# Patient Record
Sex: Female | Born: 1975 | Race: Black or African American | Hispanic: No | Marital: Single | Smoking: Never smoker
Health system: Southern US, Community
[De-identification: ages and names within clinical notes are randomized; demographics above are authoritative.]

## PROBLEM LIST (undated history)

## (undated) DIAGNOSIS — G35D Multiple sclerosis, unspecified: Secondary | ICD-10-CM

## (undated) DIAGNOSIS — G35 Multiple sclerosis: Secondary | ICD-10-CM

## (undated) DIAGNOSIS — E119 Type 2 diabetes mellitus without complications: Secondary | ICD-10-CM

## (undated) HISTORY — DX: Type 2 diabetes mellitus without complications: E11.9

## (undated) HISTORY — DX: Multiple sclerosis: G35

## (undated) HISTORY — DX: Multiple sclerosis, unspecified: G35.D

## (undated) HISTORY — PX: KNEE SURGERY: SHX244

---

## 2014-10-26 HISTORY — PX: ABDOMINAL HYSTERECTOMY: SHX81

## 2022-02-24 NOTE — Progress Notes (Signed)
? ?[facility] ? ?PATIENT: Karen Oconnor ?DOB: Jul 18, 1976 ? ?REFERRING DOCTOR OR PCP: Harvie Heck, MD; Megan Mans, NP ?SOURCE: Patient, notes from [facility] between 2013 and present, imaging and lab reports, MRI images will be personally reviewed when available an addendum will be added ? ?_________________________________ ? ? ?HISTORICAL ? ?CHIEF COMPLAINT:  ?Chief Complaint  ?Patient presents with  ? New Patient (Initial Visit)  ?  RM 2,alone. Paper referral from [facility]/MS TOC. Dx w/ MS in 2013 by MRI. Tried/failed: Started on Tecfidera in 2013 (flare ups). Then switched to Ocrevus a couple years ago.  Pt reports sx have worsened since covid. Right side weakness has increased, requesting PT. Wanting to discuss meds/pain management. Gabapentin takes 3 hours hours to kick in.  Last MRI 11/2019. Does not have CD. Having gait/balance issues. Wears glasses.   ? ? ?HISTORY OF PRESENT ILLNESS:  ?I had the pleasure seeing your patient, Karen Oconnor, at the [facility] at [facility] for a neurologic consultation regarding her multiple sclerosis. ? ?In July, she presented with electrical sock sensations in her back and legs followed by numbness in the right flank/abdomen and allodynia in her right leg.   She also had a right foot drop and spasticity.   She also has numbness in her left foot.   She was concerned about stroke and saw Dr. Corwin Levins who did an MRI of the cervical/thoracic spine.   She had a large non-enhancing lesion (was 3 months after 1st symptom) at T7-T11.   MRI of the brain also showed lesions and MS was diagnosed.   She was initially placed on Tecfidera and stayed on that medication until March 2021 after an MRI February 2021 showed additional multiple sclerosis foci.  She was placed on Ocrevus March 2021 and continues on that medication.  She has been seeing Dr. Melrose Nakayama at [facility]. ? ?She is currently on Ocrevus and tolerates it very well.     She has no infusion reactions. She has had no exacerbations since starting.   She is walking without support.   She has a right foot drop and stumbles but very rare falls.     She has altered sensation sensation in her right leg and a different altered sensation right flank with numbness.    Bladder function is doing ok with some urgency but no incontinence.   She had some bowel incontinence while on Metformin but none off of it.     Vision was poor as a child and she feels it has been stable and she is much better with contacts.     Color vision is fine.   She never had diplopia.    ? ?She denies limiting fatigue.   She sleeps well most nights.  Mood and cognition are doing well.   ? ?She also has type 2 NIDDM.   She had EBV and Varicella as a child.     ? ? ?Imaging (report): ?MRI of the brain 12/09/2019 showed multiple T2/FLAIR hyperintense foci in the periventricular, juxtacortical and deep white matter.  2 of the foci, 1 high in the right frontal lobe and and other in the periventricular white matter adjacent to the left ventricular atrium. ? ?MRI of the cervical and thoracic spine 12/09/2019 showed a focus at T7-T8 centrally towards the right and possible focus at C3-C4. ? ?MRI report from 08/24/2012 showed multiple T2/FLAIR hyperintense foci in the cerebral hemispheres consistent with MS with enhancement of a focus in the  left parietal lobe. ? ?MRI of the cervical and thoracic spine 08/11/2012 shows a right hemicord focus from T7-T11 that did not enhance.  The cervical spinal cord was normal. ? ?Laboratory tests: ?11/28/2019 Quantiferon TB and hepatitis B and C tests were negative.  IgG and IgM and IgA were normal. ? ?08/30/2012: Anti-NMO IgG was negative ? ?REVIEW OF SYSTEMS: ?Constitutional: No fevers, chills, sweats, or change in appetite ?Eyes: No visual changes, double vision, eye pain ?Ear, nose and throat: No hearing loss, ear pain, nasal congestion, sore throat ?Cardiovascular: No chest pain,  palpitations ?Respiratory:  No shortness of breath at rest or with exertion.   No wheezes ?GastrointestinaI: No nausea, vomiting, diarrhea, abdominal pain, fecal incontinence ?Genitourinary:  No dysuria, urinary retention or frequency.  No nocturia. ?Musculoskeletal:  No neck pain, back pain ?Integumentary: No rash, pruritus, skin lesions ?Neurological: as above ?Psychiatric: No depression at this time.  No anxiety ?Endocrine: No palpitations, diaphoresis, change in appetite, change in weigh or increased thirst ?Hematologic/Lymphatic:  No anemia, purpura, petechiae. ?Allergic/Immunologic: No itchy/runny eyes, nasal congestion, recent allergic reactions, rashes ? ?ALLERGIES: ?No Known Allergies ? ?HOME MEDICATIONS: ? ?Current Outpatient Medications:  ?  Alogliptin Benzoate 25 MG TABS, Take 1 tablet by mouth daily., Disp: , Rfl:  ?  baclofen (LIORESAL) 10 MG tablet, Take 10 mg by mouth as needed., Disp: , Rfl:  ?  Biotin 10 MG CAPS, Take 10,000 mcg by mouth daily., Disp: , Rfl:  ?  Cholecalciferol 25 MCG (1000 UT) capsule, Take 5,000 Units by mouth daily., Disp: , Rfl:  ?  FARXIGA 10 MG TABS tablet, Take 10 mg by mouth daily., Disp: , Rfl:  ?  gabapentin (NEURONTIN) 300 MG capsule, Take 900 mg by mouth 2 (two) times daily., Disp: , Rfl:  ?  naproxen (NAPROSYN) 250 MG tablet, Take 250 mg by mouth as needed., Disp: , Rfl:  ?  ocrelizumab (OCREVUS) 300 MG/10ML injection, Inject 600 mg into the vein every 6 (six) months., Disp: , Rfl:  ? ?PAST MEDICAL HISTORY: ?Past Medical History:  ?Diagnosis Date  ? Diabetes (HCC)   ? MS (multiple sclerosis) (HCC)   ? ? ?PAST SURGICAL HISTORY: ? ? ?FAMILY HISTORY: ?Family History  ?Problem Relation Age of Onset  ? Gout Mother   ? Diabetes Father   ? ? ?SOCIAL HISTORY: ? ?Social History  ? ?Socioeconomic History  ? Marital status: Single  ?  Spouse name: Not on file  ? Number of children: Not on file  ? Years of education: Not on file  ? Highest education level: Not on file   ?Occupational History  ? Not on file  ?Tobacco Use  ? Smoking status: Never  ? Smokeless tobacco: Never  ?Substance and Sexual Activity  ? Alcohol use: Yes  ?  Comment: rare  ? Drug use: Never  ? Sexual activity: Not on file  ?Other Topics Concern  ? Not on file  ?Social History Narrative  ? Right handed  ? Caffeine use: coffee/tea daily  ? ?Social Determinants of Health  ? ?Financial Resource Strain: Not on file  ?Food Insecurity: Not on file  ?Transportation Needs: Not on file  ?Physical Activity: Not on file  ?Stress: Not on file  ?Social Connections: Not on file  ?Intimate Partner Violence: Not on file  ? ? ? ?PHYSICAL EXAM ? ?Vitals:  ? 02/25/22 0900  ?BP: (!) 143/110  ?Pulse: 84  ?Weight: 202 lb 8 oz (91.9 kg)  ?Height: 5\' 6"  (1.676 m)  ? ? ?  Body mass index is 32.68 kg/m?. ? ? ? ? ?General: The patient is well-developed and well-nourished and in no acute distress ? ?HEENT:  Head is [place]/AT.  Sclera are anicteric.  Funduscopic exam shows normal optic discs and retinal vessels. ? ?Neck: No carotid bruits are noted.  The neck is nontender. ? ?Cardiovascular: The heart has a regular rate and rhythm with a normal S1 and S2. There were no murmurs, gallops or rubs.   ? ?Skin: Extremities are without rash or  edema. ? ?Musculoskeletal:  Back is nontender ? ?Neurologic Exam ? ?Mental status: The patient is alert and oriented x 3 at the time of the examination. The patient has apparent normal recent and remote memory, with an apparently normal attention span and concentration ability.   Speech is normal. ? ?Cranial nerves: Extraocular movements are full. Pupils are equal, round, and reactive to light and accomodation.  V visual acuity was reduced OD versus OS.  Color vision was symmetric.  There is good facial sensation to soft touch bilaterally.Facial strength is normal.  Trapezius and sternocleidomastoid strength is normal. No dysarthria is noted.  The tongue is midline, and the patient has symmetric elevation of the  soft palate. No obvious hearing deficits are noted. ? ?Motor:  Muscle bulk is normal.   Tone is normal. Strength is  5 / 5 in arms and left leg but 4+/5 proximal right leg and 4/5 EHL and ankle extension ? ?Sensory: She re

## 2022-02-25 ENCOUNTER — Ambulatory Visit: Payer: Federal, State, Local not specified - PPO | Admitting: Neurology

## 2022-02-25 ENCOUNTER — Encounter: Payer: Self-pay | Admitting: Neurology

## 2022-02-25 VITALS — BP 143/110 | HR 84 | Ht 66.0 in | Wt 202.5 lb

## 2022-02-25 DIAGNOSIS — G373 Acute transverse myelitis in demyelinating disease of central nervous system: Secondary | ICD-10-CM

## 2022-02-25 DIAGNOSIS — G35 Multiple sclerosis: Secondary | ICD-10-CM | POA: Diagnosis not present

## 2022-02-25 DIAGNOSIS — R208 Other disturbances of skin sensation: Secondary | ICD-10-CM | POA: Insufficient documentation

## 2022-02-25 DIAGNOSIS — G35A Relapsing-remitting multiple sclerosis: Secondary | ICD-10-CM | POA: Insufficient documentation

## 2022-02-25 DIAGNOSIS — M21371 Foot drop, right foot: Secondary | ICD-10-CM | POA: Diagnosis not present

## 2022-02-25 DIAGNOSIS — Z79899 Other long term (current) drug therapy: Secondary | ICD-10-CM | POA: Diagnosis not present

## 2022-02-27 LAB — IGG, IGA, IGM
IgA/Immunoglobulin A, Serum: 237 mg/dL (ref 87–352)
IgG (Immunoglobin G), Serum: 1572 mg/dL (ref 586–1602)
IgM (Immunoglobulin M), Srm: 44 mg/dL (ref 26–217)

## 2022-02-27 LAB — NEUROMYELITIS OPTICA AUTOAB, IGG: NMO IgG Autoantibodies: <1.5 U/mL (ref 0.0–3.0)

## 2022-02-27 LAB — ANTI-MOG, SERUM: MOG Antibody, Cell-based IFA: NEGATIVE

## 2022-02-28 ENCOUNTER — Ambulatory Visit (HOSPITAL_BASED_OUTPATIENT_CLINIC_OR_DEPARTMENT_OTHER)
Admission: RE | Admit: 2022-02-28 | Discharge: 2022-02-28 | Disposition: A | Discharge status: Home / Self Care | Payer: Federal, State, Local not specified - PPO | Source: Ambulatory Visit | Attending: Neurology | Admitting: Neurology

## 2022-02-28 ENCOUNTER — Encounter (HOSPITAL_BASED_OUTPATIENT_CLINIC_OR_DEPARTMENT_OTHER): Payer: Self-pay

## 2022-02-28 DIAGNOSIS — M21371 Foot drop, right foot: Secondary | ICD-10-CM

## 2022-02-28 DIAGNOSIS — G373 Acute transverse myelitis in demyelinating disease of central nervous system: Secondary | ICD-10-CM

## 2022-02-28 DIAGNOSIS — G35 Multiple sclerosis: Secondary | ICD-10-CM | POA: Insufficient documentation

## 2022-02-28 IMAGING — MR MR THORACIC SPINE WO/W CM
7 of 13 series · 20 of 48 positions shown · IV contrast (gadavist)
Comparison: None Available.

CLINICAL DATA: Multiple sclerosis, right-sided weakness

EXAM:
MRI CERVICAL AND THORACIC SPINE WITHOUT AND WITH CONTRAST
TECHNIQUE: Multiplanar and multiecho pulse sequences of the cervical spine, to
include the craniocervical junction and cervicothoracic junction,
and the thoracic spine, were obtained without and with intravenous
contrast.
CONTRAST:  7.5mL GADAVIST GADOBUTROL 1 MMOL/ML IV SOLN

[Series 4: T1 · sagittal · 3.0mm · 0.94mm/px · 1 of 9 slices shown (1 of 4)]
[im 1/9]
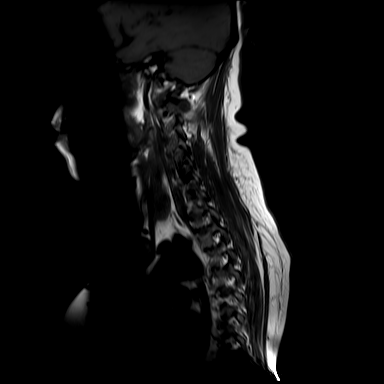

[Series 5: T1 · sagittal · 4.0mm · 0.53mm/px · 2 of 14 slices shown (2 of 4)]
[im 1/14]
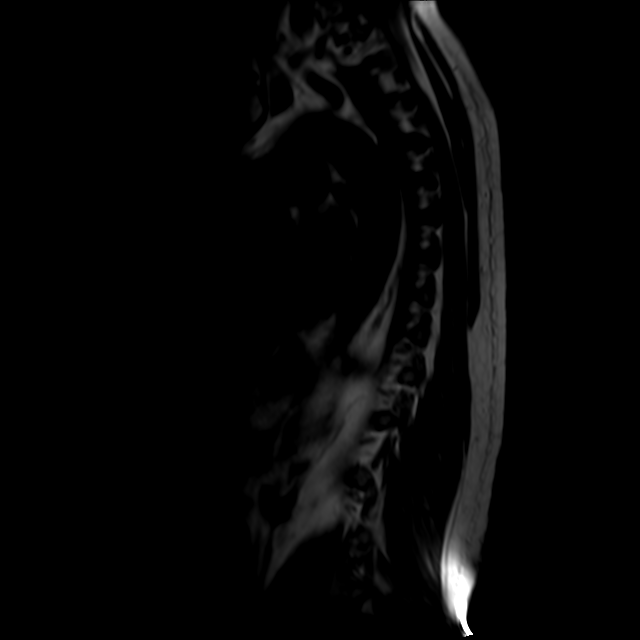
[im 14/14]
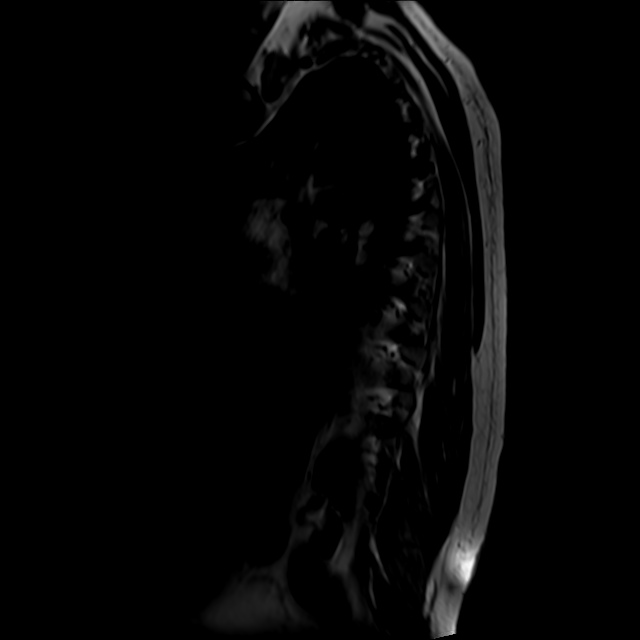

[Series 7: T2 · axial · 4.0mm · 0.39mm/px · z∈[-302,-197]mm · 4 of 24 slices shown (1 of 3)]
[im 1/24]
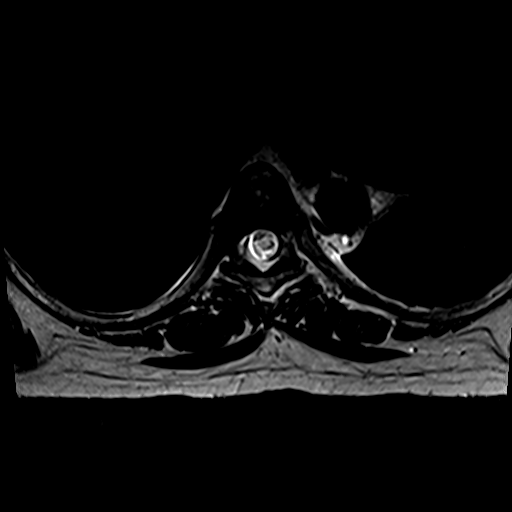
[im 8/24]
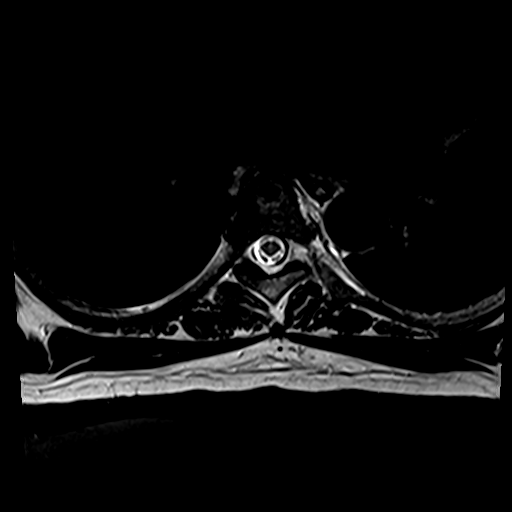
[im 16/24]
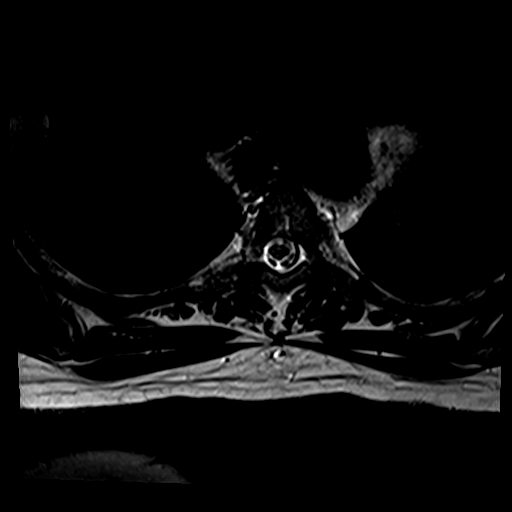
[im 24/24]
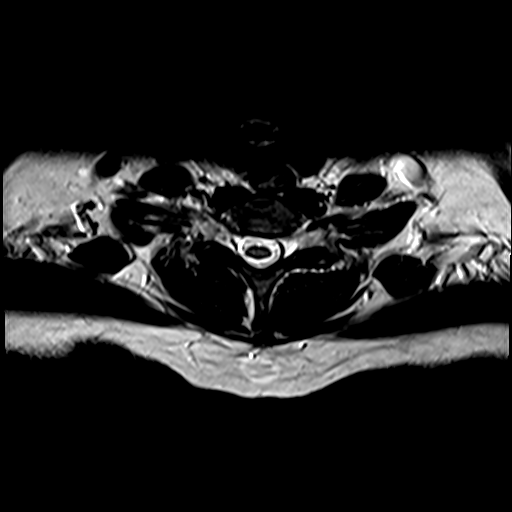

[Series 8: T2 · axial · 4.0mm · 0.39mm/px · z∈[-413,-265]mm · 5 of 24 slices shown (2 of 3)]
[im 1/24]
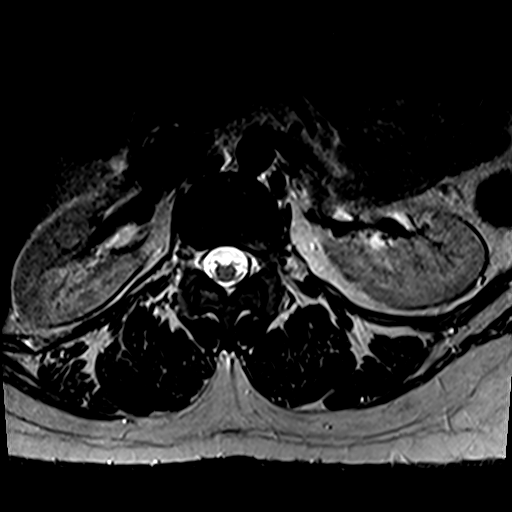
[im 6/24]
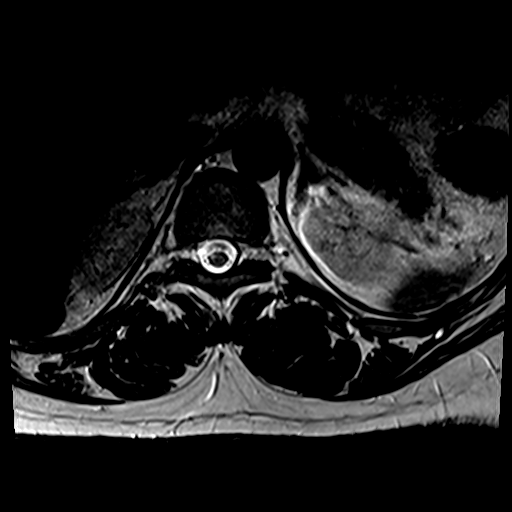
[im 12/24]
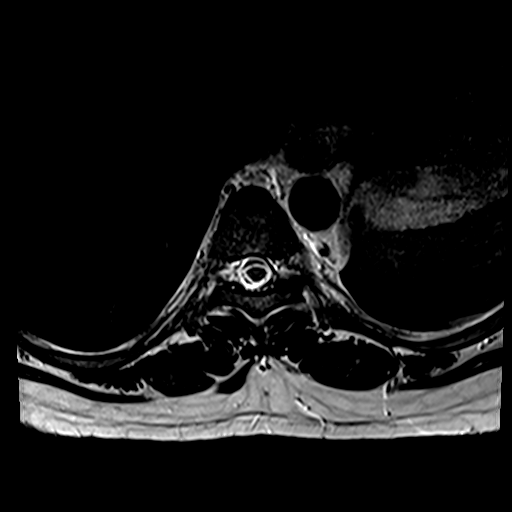
[im 18/24]
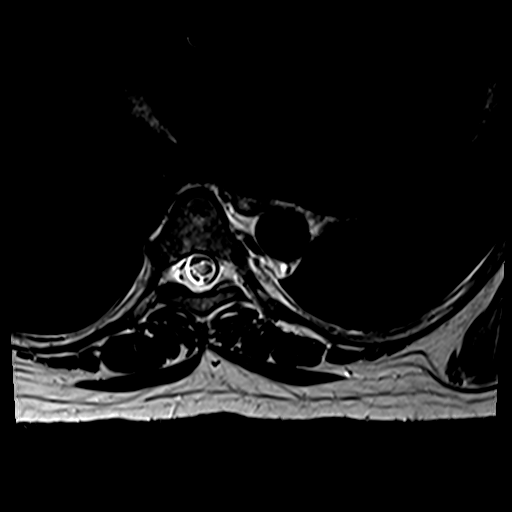
[im 24/24]
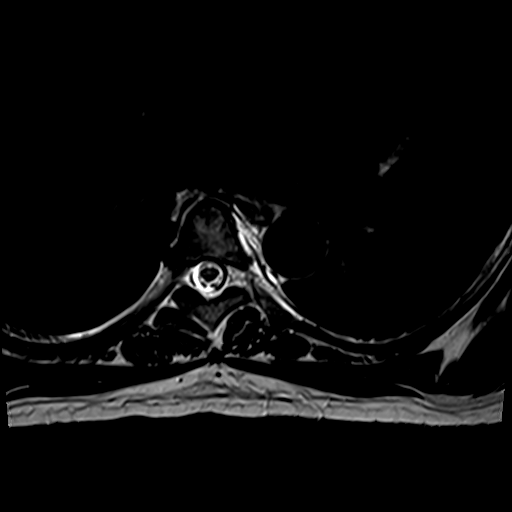

[Series 11: T1 · axial · non-contrast · 4.0mm · 0.41mm/px · z∈[-273,-198]mm · 3 of 18 slices shown (3 of 4)]
[im 1/18]
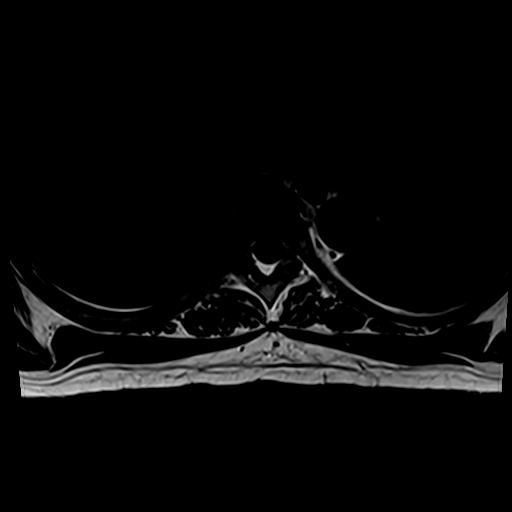
[im 9/18]
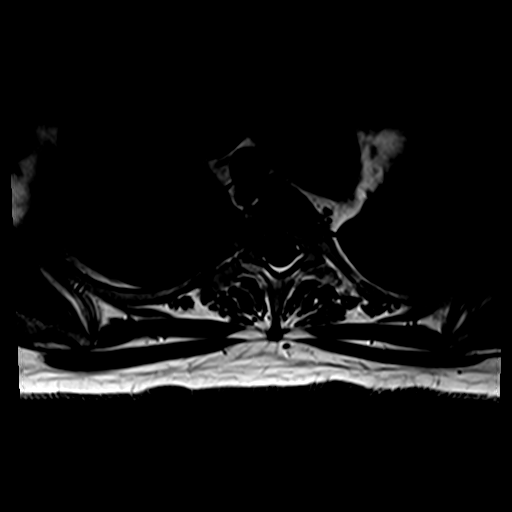
[im 18/18]
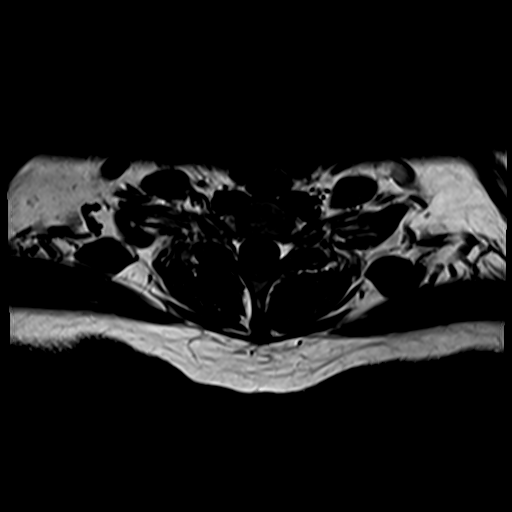

[Series 12: T1 · axial · non-contrast · 4.0mm · 0.41mm/px · z∈[-412,-375]mm · 2 of 24 slices shown (4 of 4)]
[im 1/24]
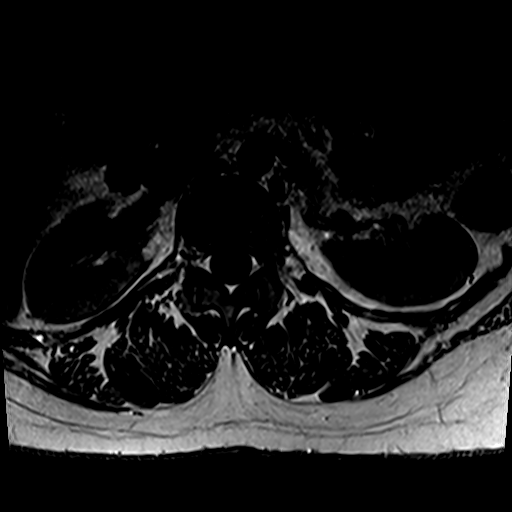
[im 6/24]
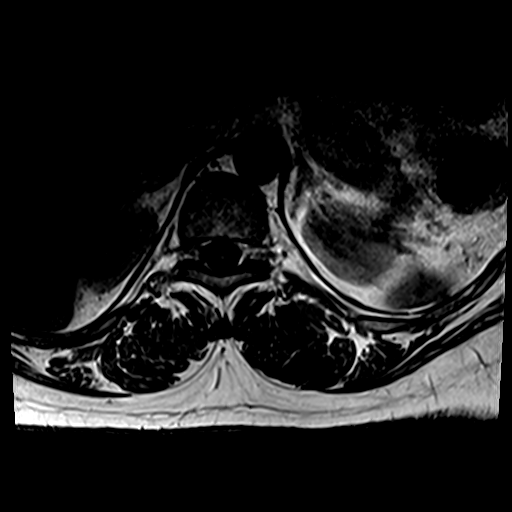

[Series 13: T2 · sagittal · 4.0mm · 0.89mm/px · 3 of 14 slices shown (3 of 3)]
[im 1/14]
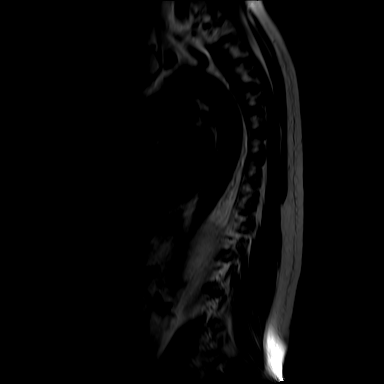
[im 7/14]
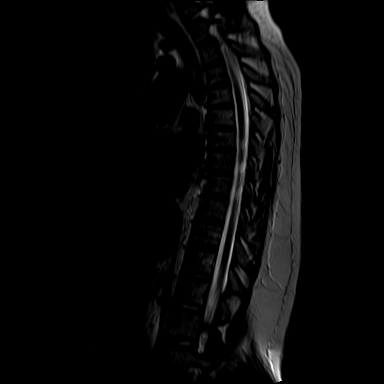
[im 14/14]
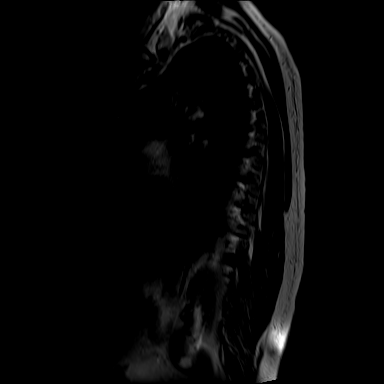

[20 of 48 positions shown; findings below may reference images not displayed]

FINDINGS: MRI CERVICAL SPINE

Alignment: Preserved.

Vertebrae: Vertebral body heights are maintained. There is no marrow
edema. No suspicious osseous lesion.

Cord: No definite abnormal signal.  No abnormal enhancement.

Posterior Fossa, vertebral arteries, paraspinal tissues:
Intracranial findings dictated separately. Otherwise unremarkable.

Disc levels: Trace central disc protrusion at C3-C4. There is no
canal or foraminal stenosis at any level.

MRI THORACIC SPINE

Alignment:  Preserved.

Vertebrae: Vertebral body heights are maintained. No marrow edema.
No suspicious osseous lesion.

Cord: T2 hyperintensity involving much of the cord eccentric to the
right at the T7 and T8 levels with associated volume loss on the
right. No definite additional abnormal signal. No abnormal
enhancement.

Paraspinal and other soft tissues: Unremarkable.

Disc levels: Intervertebral disc heights and signal are maintained.
There is no canal or foraminal stenosis at any level.
IMPRESSION: Abnormal cord signal at T7 and T8 with volume loss likely reflecting
sequelae of chronic demyelination given history.

No definite abnormal cervical cord signal.

No evidence of active demyelination.

## 2022-02-28 IMAGING — MR MR CERVICAL SPINE WO/W CM
7 of 8 series · 36 of 48 positions shown · IV contrast (GADOLINIUM)
Comparison: None Available.

CLINICAL DATA: Multiple sclerosis, right-sided weakness

EXAM:
MRI CERVICAL AND THORACIC SPINE WITHOUT AND WITH CONTRAST
TECHNIQUE: Multiplanar and multiecho pulse sequences of the cervical spine, to
include the craniocervical junction and cervicothoracic junction,
and the thoracic spine, were obtained without and with intravenous
contrast.
CONTRAST:  7.5mL GADAVIST GADOBUTROL 1 MMOL/ML IV SOLN

[Series 2: STIR · sagittal · 3.0mm · 0.69mm/px · 4 of 14 slices shown]
[im 1/14]
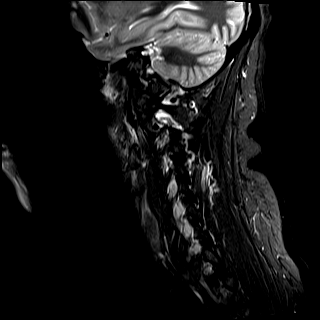
[im 5/14]
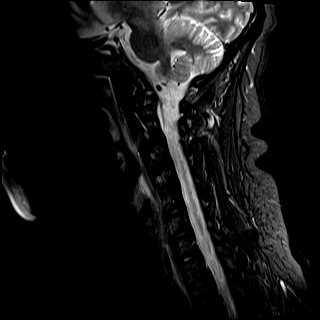
[im 9/14]
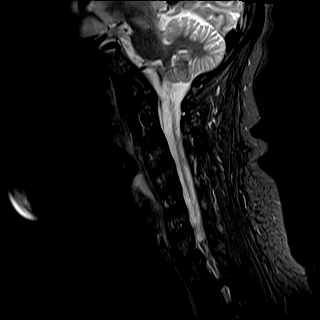
[im 14/14]
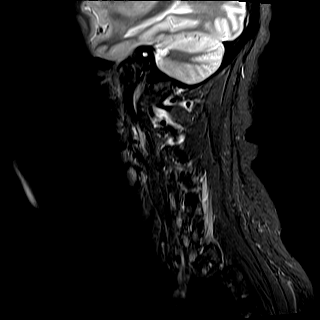

[Series 3: T1 · sagittal · 3.0mm · 0.69mm/px · 4 of 14 slices shown]
[im 1/14]
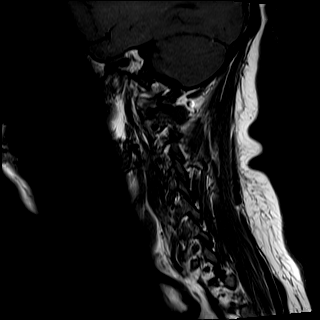
[im 5/14]
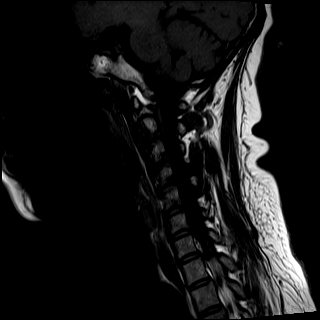
[im 9/14]
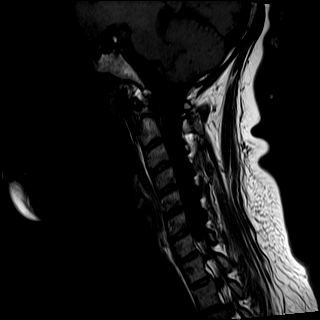
[im 14/14]
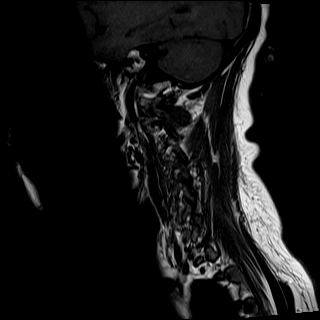

[Series 4: T2 · axial · 3.0mm · 0.62mm/px · z∈[-194,-81]mm · 8 of 34 slices shown (1 of 3)]
[im 1/34]
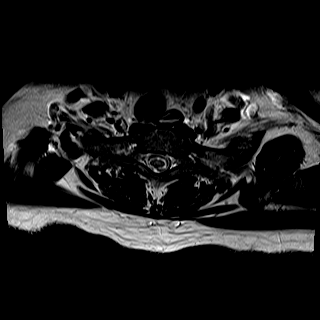
[im 5/34]
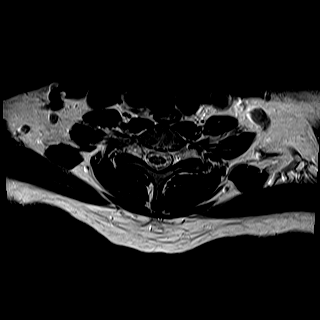
[im 10/34]
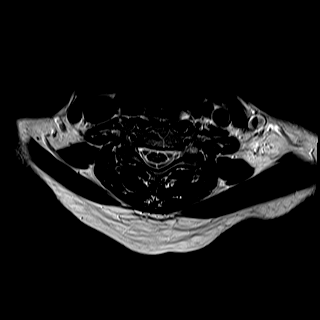
[im 15/34]
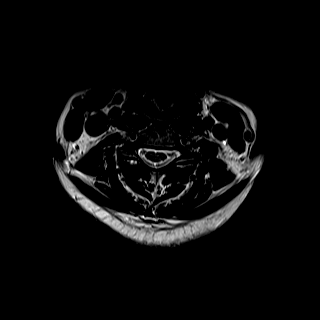
[im 19/34]
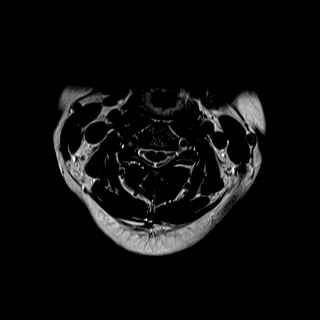
[im 24/34]
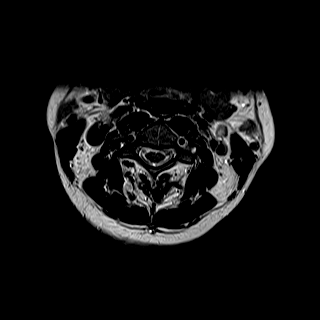
[im 29/34]
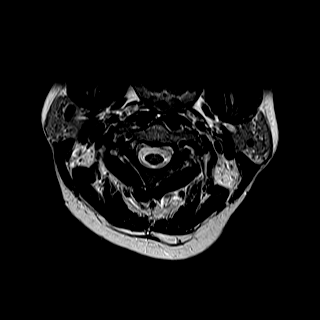
[im 34/34]
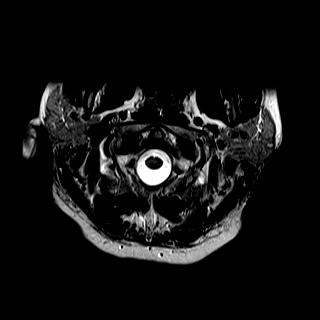

[Series 5: T2 · axial · 3.0mm · 0.39mm/px · z∈[-194,-81]mm · 8 of 34 slices shown (2 of 3)]
[im 1/34]
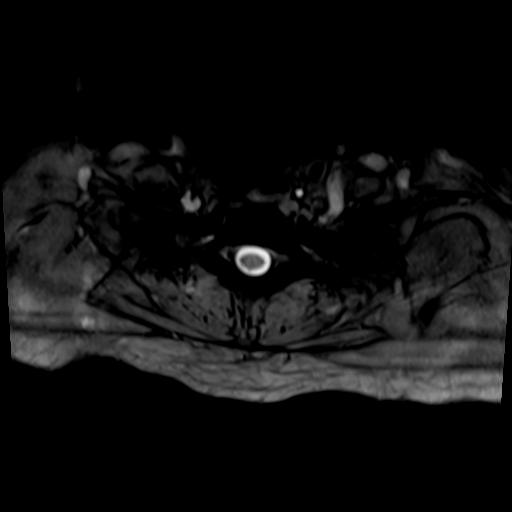
[im 5/34]
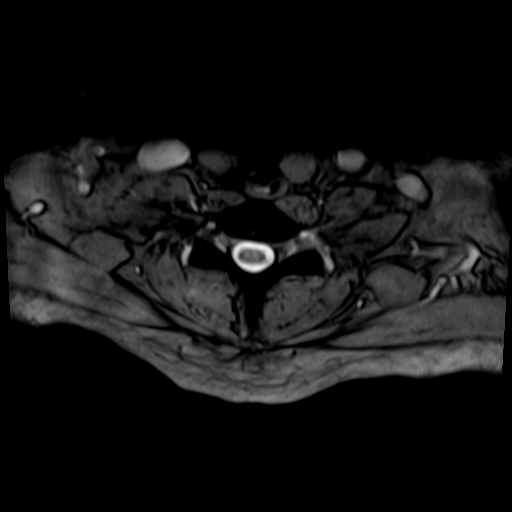
[im 10/34]
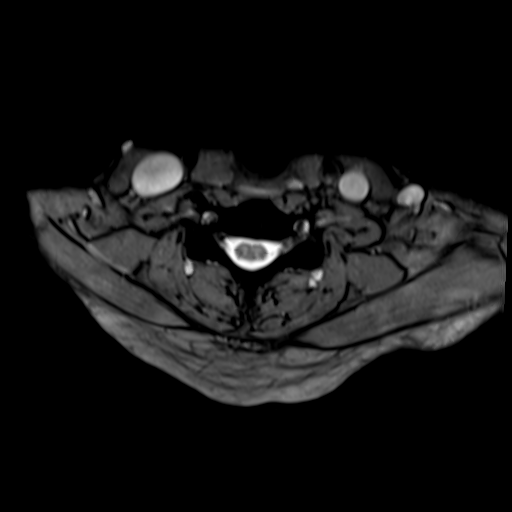
[im 15/34]
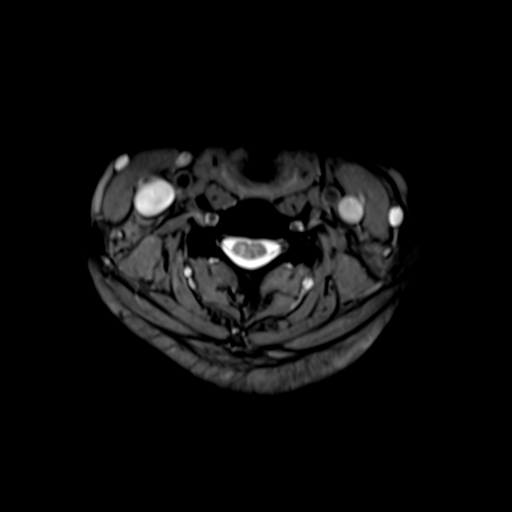
[im 19/34]
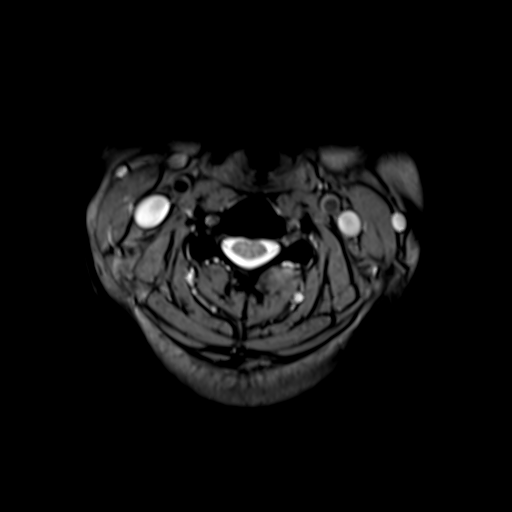
[im 24/34]
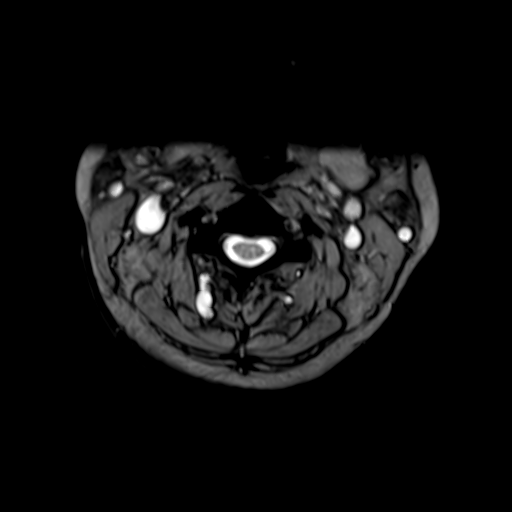
[im 29/34]
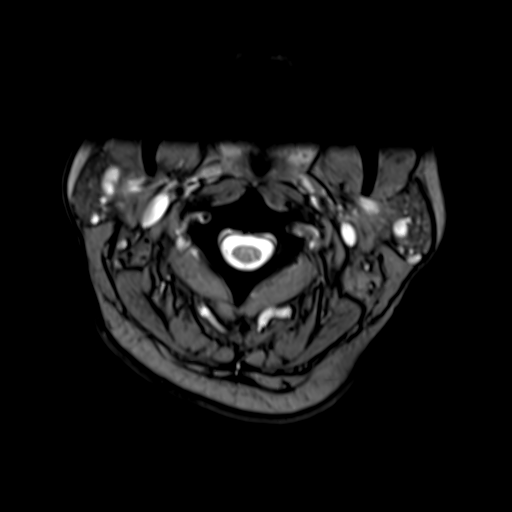
[im 34/34]
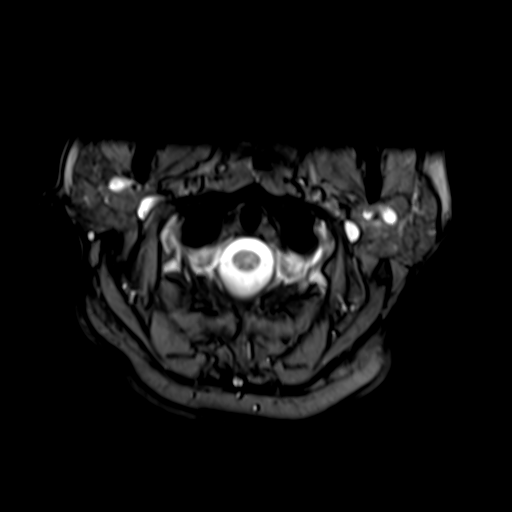

[Series 6: T1 post-contrast · axial · non-contrast · 3.0mm · 0.78mm/px · z∈[-189,-141]mm · 4 of 34 slices shown]
[im 1/34]
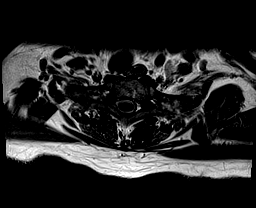
[im 5/34]
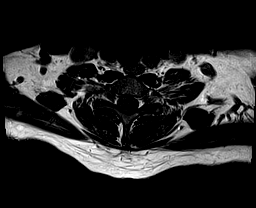
[im 10/34]
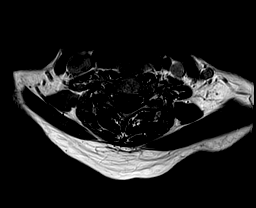
[im 15/34]
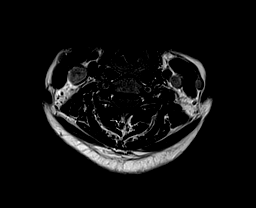

[Series 7: T2 · sagittal · 3.0mm · 0.69mm/px · 4 of 14 slices shown (3 of 3)]
[im 1/14]
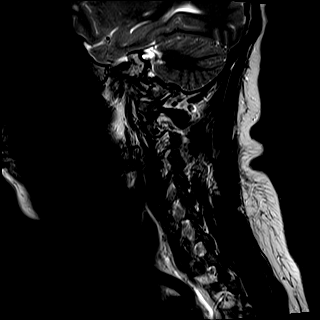
[im 5/14]
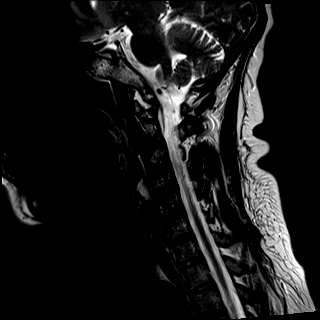
[im 9/14]
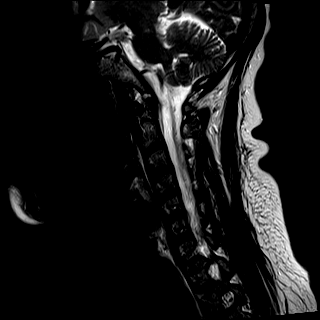
[im 14/14]
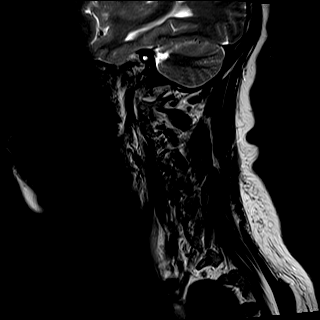

[Series 8: T1 fat-sat post-contrast · sagittal · 3.0mm · 0.69mm/px · 4 of 14 slices shown]
[im 1/14]
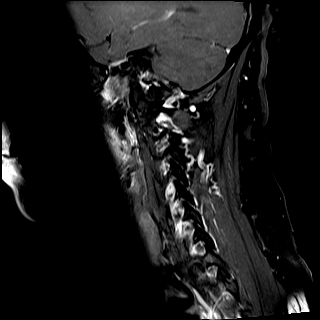
[im 5/14]
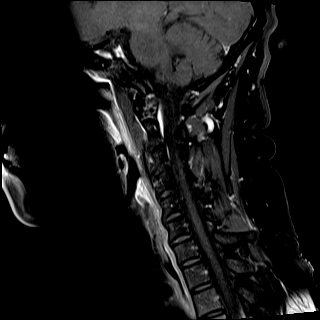
[im 9/14]
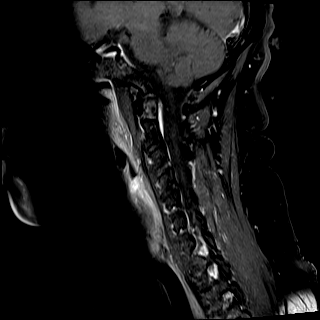
[im 14/14]
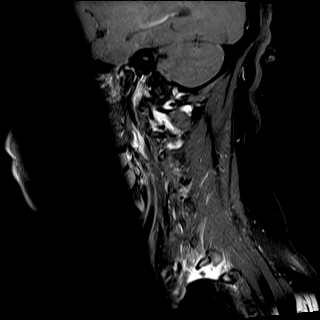

[36 of 48 positions shown; findings below may reference images not displayed]

FINDINGS: MRI CERVICAL SPINE

Alignment: Preserved.

Vertebrae: Vertebral body heights are maintained. There is no marrow
edema. No suspicious osseous lesion.

Cord: No definite abnormal signal.  No abnormal enhancement.

Posterior Fossa, vertebral arteries, paraspinal tissues:
Intracranial findings dictated separately. Otherwise unremarkable.

Disc levels: Trace central disc protrusion at C3-C4. There is no
canal or foraminal stenosis at any level.

MRI THORACIC SPINE

Alignment:  Preserved.

Vertebrae: Vertebral body heights are maintained. No marrow edema.
No suspicious osseous lesion.

Cord: T2 hyperintensity involving much of the cord eccentric to the
right at the T7 and T8 levels with associated volume loss on the
right. No definite additional abnormal signal. No abnormal
enhancement.

Paraspinal and other soft tissues: Unremarkable.

Disc levels: Intervertebral disc heights and signal are maintained.
There is no canal or foraminal stenosis at any level.
IMPRESSION: Abnormal cord signal at T7 and T8 with volume loss likely reflecting
sequelae of chronic demyelination given history.

No definite abnormal cervical cord signal.

No evidence of active demyelination.

## 2022-02-28 IMAGING — MR MR HEAD WO/W CM
11 series · 45 of 48 positions shown · IV contrast (GADOLINIUM)
Comparison: None Available.

CLINICAL DATA: Multiple sclerosis, right-sided weakness, pain,
numbness

EXAM:
MRI HEAD WITHOUT AND WITH CONTRAST
TECHNIQUE: Multiplanar, multiecho pulse sequences of the brain and surrounding
structures were obtained without and with intravenous contrast.
CONTRAST:  7.5mL GADAVIST GADOBUTROL 1 MMOL/ML IV SOLN

[Series 2: T1 · sagittal · 5.0mm · 0.90mm/px · 2 of 27 slices shown (1 of 2)]
[im 1/27]
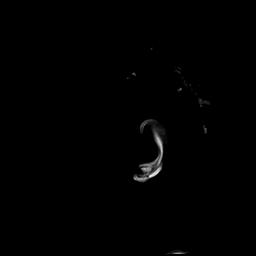
[im 27/27]
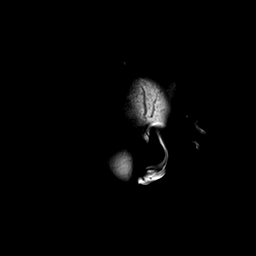

[Series 3: DWI · axial · 3.0mm · 1.88mm/px · z∈[-72,+70]mm · 7 of 88 slices shown (1 of 2)]
[im 1/88]
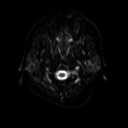
[im 15/88]
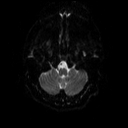
[im 30/88]
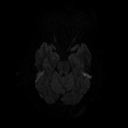
[im 44/88]
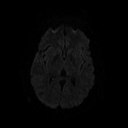
[im 59/88]
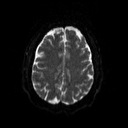
[im 73/88]
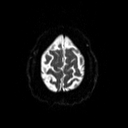
[im 88/88]
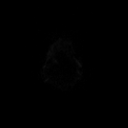

[Series 4: DWI · axial · 3.0mm · 1.88mm/px · z∈[-72,+70]mm · 4 of 43 slices shown (2 of 2)]
[im 1/43]
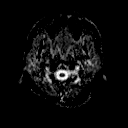
[im 15/43]
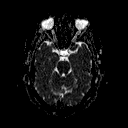
[im 29/43]
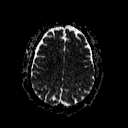
[im 43/43]
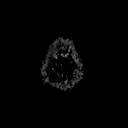

[Series 5: T2 · axial · 5.0mm · 0.69mm/px · z∈[-82,+80]mm · 3 of 28 slices shown (1 of 3)]
[im 1/28]
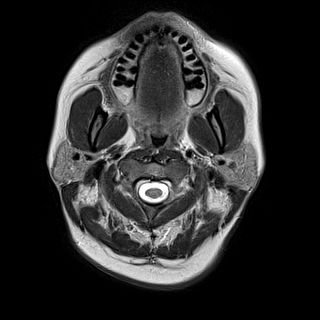
[im 14/28]
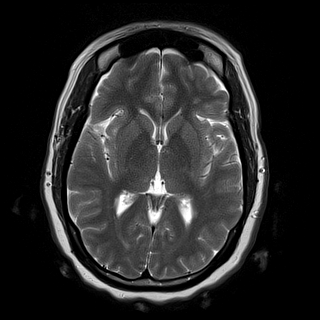
[im 28/28]
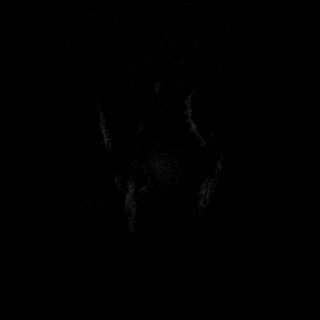

[Series 6: T2 · axial · 5.0mm · 0.43mm/px · z∈[-82,+80]mm · 3 of 28 slices shown (2 of 3)]
[im 1/28]
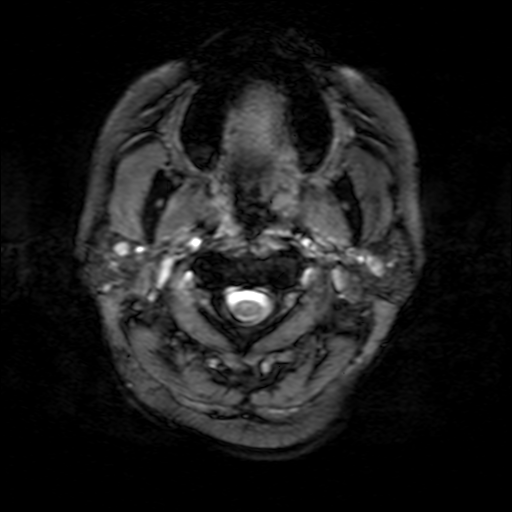
[im 14/28]
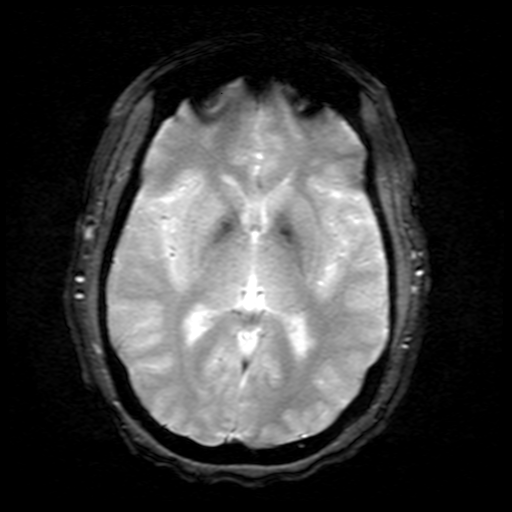
[im 28/28]
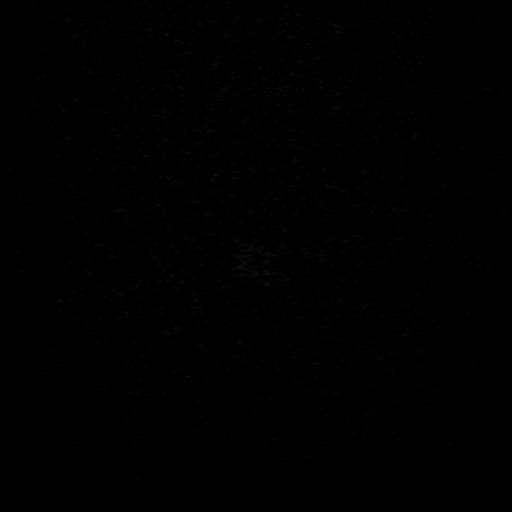

[Series 7: FLAIR · axial · 3.0mm · 0.43mm/px · z∈[-82,+81]mm · 4 of 42 slices shown (1 of 2)]
[im 1/42]
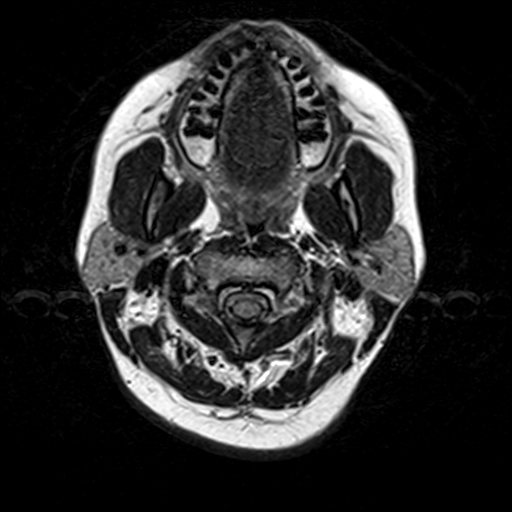
[im 14/42]
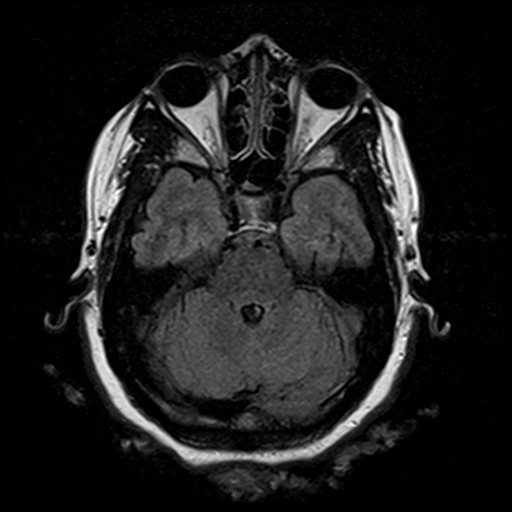
[im 28/42]
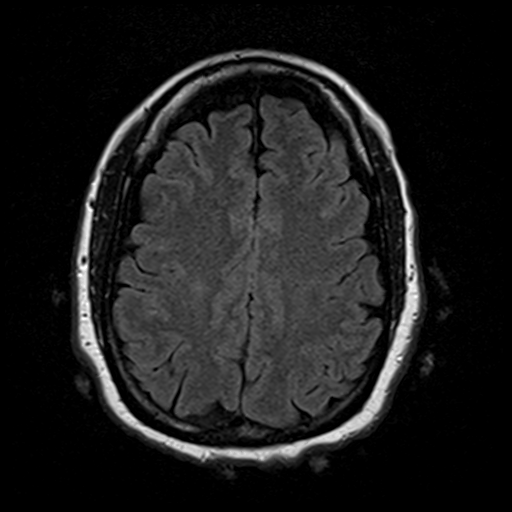
[im 42/42]
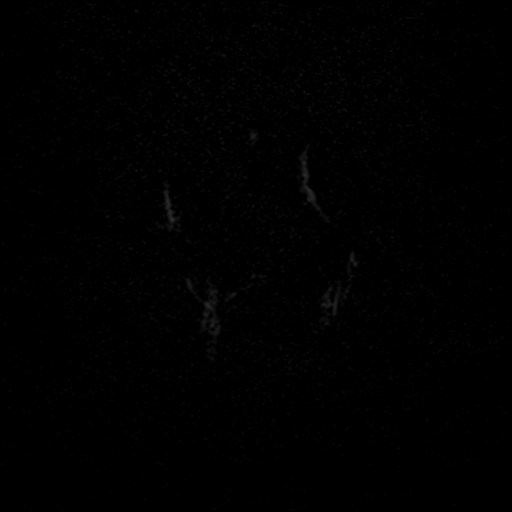

[Series 8: FLAIR · sagittal · 4.0mm · 0.47mm/px · 3 of 30 slices shown (2 of 2)]
[im 1/30]
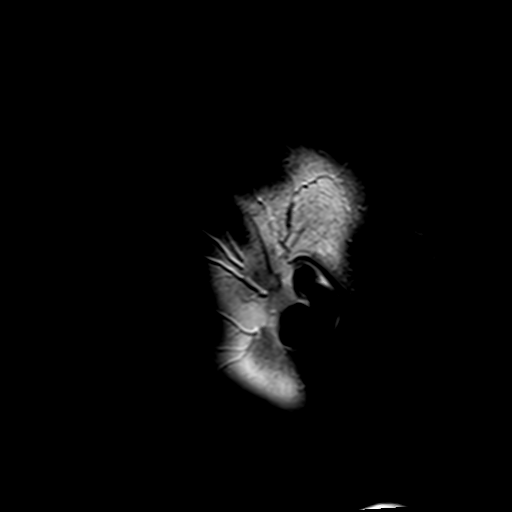
[im 15/30]
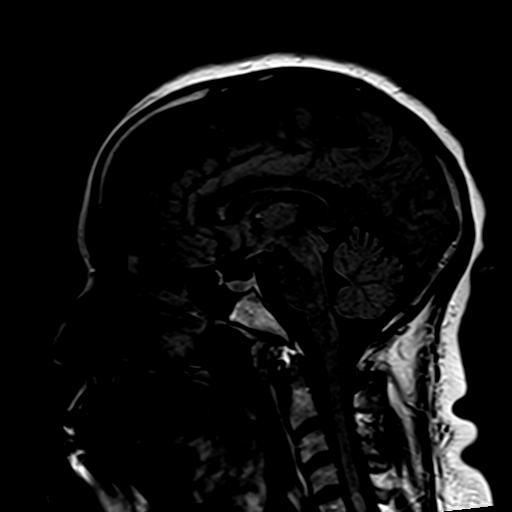
[im 30/30]
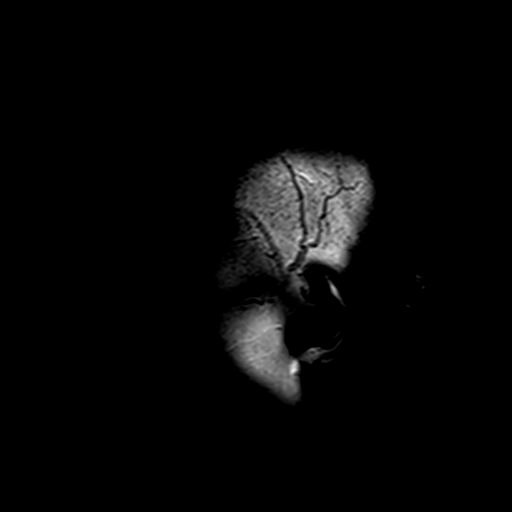

[Series 9: t1_3d_tra · axial · 2.0mm · 0.94mm/px · z∈[-74,+84]mm · 8 of 80 slices shown]
[im 1/80]
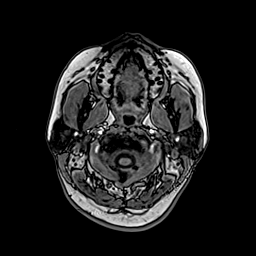
[im 12/80]
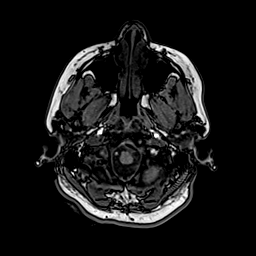
[im 23/80]
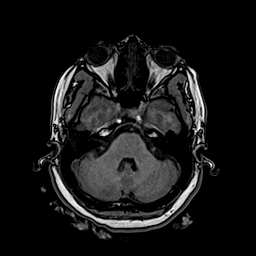
[im 34/80]
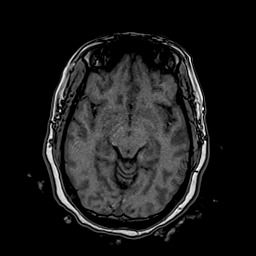
[im 46/80]
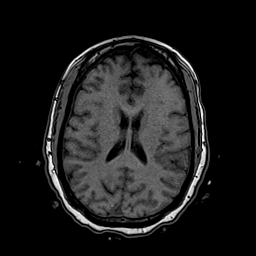
[im 57/80]
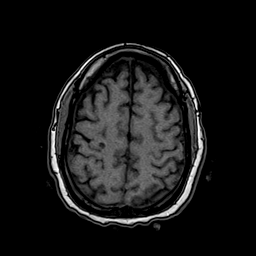
[im 68/80]
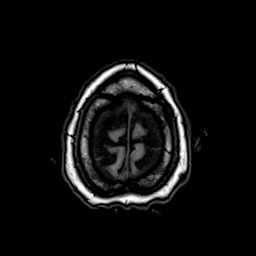
[im 80/80]
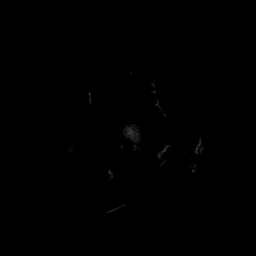

[Series 10: T2 · coronal · 5.0mm · 0.69mm/px · 3 of 28 slices shown (3 of 3)]
[im 1/28]
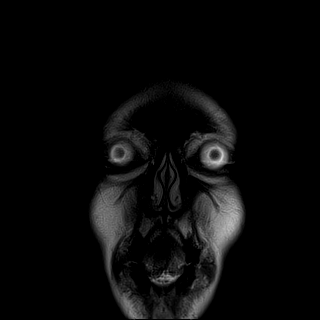
[im 14/28]
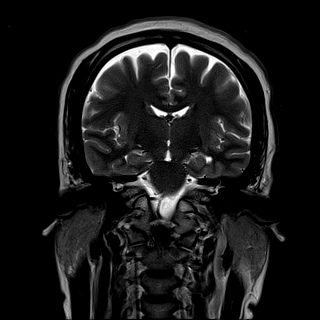
[im 28/28]
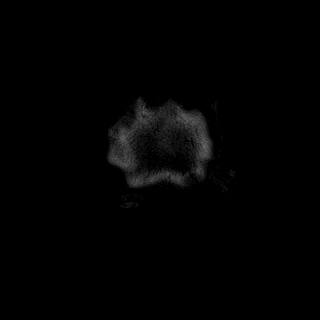

[Series 11: T1 · coronal · 5.0mm · 0.86mm/px · 3 of 28 slices shown (2 of 2)]
[im 1/28]
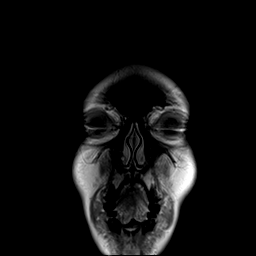
[im 14/28]
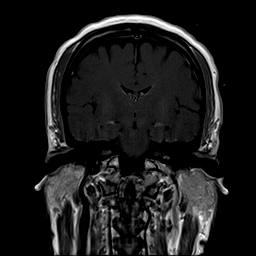
[im 28/28]
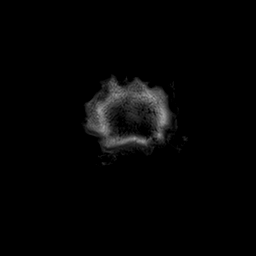

[Series 12: t1_3d_tra +c · axial · 2.0mm · 0.94mm/px · z∈[-74,+16]mm · 5 of 80 slices shown]
[im 1/80]
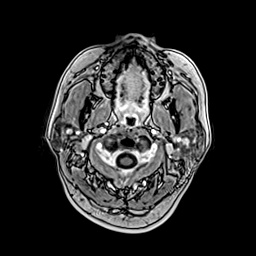
[im 12/80]
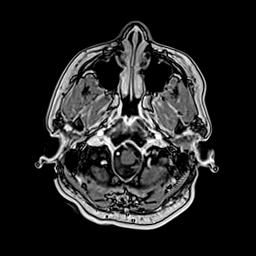
[im 23/80]
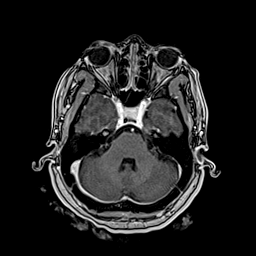
[im 34/80]
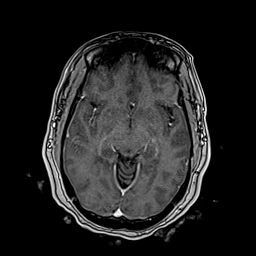
[im 46/80]
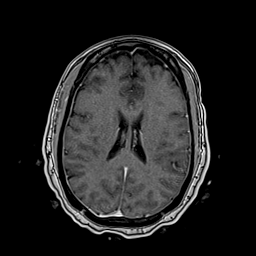

[45 of 48 positions shown; findings below may reference images not displayed]

FINDINGS: Brain: There is no evidence of acute intracranial hemorrhage,
extra-axial fluid collection, or acute infarct.

Parenchymal volume is normal. The ventricles are normal in size.
There are scattered foci of FLAIR signal abnormality in the
juxtacortical and periventricular white matter likely reflecting
demyelinating disease given the history. There is no abnormal
enhancement or diffusion restriction to suggest active
demyelination.

There is no solid mass lesion. There is no abnormal enhancement.
There is no mass effect or midline shift.

Vascular: Normal flow voids.

Skull and upper cervical spine: Normal marrow signal.

Sinuses/Orbits: The paranasal sinuses are clear. The globes and
orbits are unremarkable. The optic nerves are unremarkable on these
nondedicated sequences.

Other: None.
IMPRESSION: Scattered foci of FLAIR signal abnormality in the juxtacortical and
periventricular white matter likely reflecting sequela of multiple
sclerosis given the history. No evidence of active demyelination.

## 2022-02-28 MED ORDER — GADOBUTROL 1 MMOL/ML IV SOLN
7.5000 mL | Freq: Once | INTRAVENOUS | Status: AC | PRN
  Administered 2022-02-28: 7.5 mL via INTRAVENOUS

## 2022-03-02 ENCOUNTER — Encounter: Payer: Self-pay | Admitting: Neurology

## 2022-03-02 ENCOUNTER — Telehealth: Payer: Self-pay

## 2022-03-02 NOTE — Progress Notes (Signed)
All blood work is within normal limits

## 2022-03-02 NOTE — Telephone Encounter (Signed)
Received Ocrevus start form. Patient is currently on Ocrevus. Last infusion was 02/09/22. Faxed start form to [employer]. Received a receipt of confirmation. ? ?Ocrevus order given to Dr. Epimenio Foot for review and signature. ?

## 2022-03-02 NOTE — Progress Notes (Signed)
MRI C and T spine also do not show any evidence of active demyelination

## 2022-03-02 NOTE — Progress Notes (Signed)
There is no evidence of active demyelination on her most recent MRI

## 2022-03-03 ENCOUNTER — Telehealth: Payer: Self-pay | Admitting: *Deleted

## 2022-03-03 NOTE — Telephone Encounter (Signed)
Cd from [place] in nurse pod  ?

## 2022-03-03 NOTE — Telephone Encounter (Signed)
The C3-4 disc protrusion is very mild, and is not severe enough to cause any significant narrowing in the spine. It's likely an incidental finding that is not causing any symptoms

## 2022-03-07 ENCOUNTER — Other Ambulatory Visit (HOSPITAL_BASED_OUTPATIENT_CLINIC_OR_DEPARTMENT_OTHER): Payer: Federal, State, Local not specified - PPO

## 2022-03-09 NOTE — Telephone Encounter (Signed)
Ocrevus order signed by Dr. Felecia Shelling. Given to the Infusion Suite for processing. Patient is not due for her Ocrevus infusion until October. ?

## 2022-03-10 ENCOUNTER — Ambulatory Visit: Payer: Federal, State, Local not specified - PPO | Attending: Neurology | Admitting: Physical Therapy

## 2022-03-10 DIAGNOSIS — G35 Multiple sclerosis: Secondary | ICD-10-CM | POA: Diagnosis not present

## 2022-03-10 DIAGNOSIS — R2681 Unsteadiness on feet: Secondary | ICD-10-CM | POA: Diagnosis present

## 2022-03-10 DIAGNOSIS — R2689 Other abnormalities of gait and mobility: Secondary | ICD-10-CM | POA: Diagnosis present

## 2022-03-10 DIAGNOSIS — M6281 Muscle weakness (generalized): Secondary | ICD-10-CM | POA: Insufficient documentation

## 2022-03-10 NOTE — Therapy (Signed)
?OUTPATIENT PHYSICAL THERAPY NEURO EVALUATION ? ? ?Patient Name: Karen Oconnor ?MRN: [id number] ?DOB:[birth year], 46 y.o., female ?Today's Date: 03/10/2022 ? ?PCP: Hermine Messick, MD ?REFERRING PROVIDER: Britt Bottom, MD  ? ? PT End of Session - 03/10/22 1456   ? ? Visit Number 1   ? Number of Visits 9   Plus eval  ? Date for PT Re-Evaluation 06/02/22   ? Authorization Type BCBS/FEDERAL EMP PPO   ? PT Start Time 1452   ? PT Stop Time 1531   ? PT Time Calculation (min) 39 min   ? Activity Tolerance Patient tolerated treatment well   ? Behavior During Therapy [facility] for tasks assessed/performed   ? ?  ?  ? ?  ? ? ?Past Medical History:  ?Diagnosis Date  ? Diabetes ([place])   ? MS (multiple sclerosis) ([place])   ? ?Past Surgical History:  ?Procedure Laterality Date  ? ABDOMINAL HYSTERECTOMY  2016  ? ?Patient Active Problem List  ? Diagnosis Date Noted  ? Transverse myelitis ([place]) 02/25/2022  ? Multiple sclerosis ([place]) 02/25/2022  ? Right foot drop 02/25/2022  ? High risk medication use 02/25/2022  ? Allodynia 02/25/2022  ? ? ?ONSET DATE: 02/25/2022 (referral) ? ?REFERRING DIAG: G35 (ICD-10-CM) - Multiple sclerosis ([place])  ? ?THERAPY DIAG:  ?Other abnormalities of gait and mobility ? ?Muscle weakness (generalized) ? ?Unsteadiness on feet ? ?SUBJECTIVE:  ?                                                                                                                                                                                           ? ?SUBJECTIVE STATEMENT: ?Pt diagnosed w/MS in 2013 and was receiving PT intermittently until Covid started (early 2020). 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 Moved to [place] from [place] last year so is here to reestablish PT services.  ? ?Pt accompanied by: self ? ?PERTINENT HISTORY: was diagnosed with MS in 2013 after presenting with thoracic transverse myelitis and was found to have multiple other lesions consistent with MS and right leg  spasticity/foot drop. She presented with electrical sock sensations in her back and legs followed by numbness in the right flank/abdomen and allodynia in her right leg. She also had a right foot drop and spasticity. She also has numbness in her left foot. She was concerned about stroke and saw Dr. Hart Robinsons who did an MRI of the cervical/thoracic spine. She had a large non-enhancing lesion (was 3 months after 1st symptom) at T7-T11. MRI of the brain also showed lesions and MS was diagnosed. 2 NIDDM. She had EBV and  Varicella as a child  ? ?PAIN:  ?Are you having pain (Pt describes it as more of an annoyance than actual pain)? Yes: NPRS scale: 3/10 ?Pain location: Bilat feet (R>L), R hip  ?Pain description: achy, numbness, tingling  ? ?PRECAUTIONS: Fall ? ?WEIGHT BEARING RESTRICTIONS No ? ?FALLS: Has patient fallen in last 6 months? No, but plenty of near-misses  ? ?LIVING ENVIRONMENT: ?Lives with: lives with their daughter ?Lives in: House/apartment ?Stairs: Yes: Internal: full flight steps; on left going up and External: 7-8 steps; bilateral but cannot reach both ?Has following equipment at home: None ? ?PLOF: Independent ? ?PATIENT GOALS "increase flexibility, ROM, strength, balance, coordination"  ? ?OBJECTIVE:  ? ?DIAGNOSTIC FINDINGS: MRI of the brain 12/09/2019 showed multiple T2/FLAIR hyperintense foci in the periventricular, juxtacortical and deep white matter.  2 of the foci, 1 high in the right frontal lobe and and other in the periventricular white matter adjacent to the left ventricular atrium. ?  ?MRI of the cervical and thoracic spine 12/09/2019 showed a focus at T7-T8 centrally towards the right and possible focus at C3-C4. ? ?COGNITION: ?Overall cognitive status: Within functional limits for tasks assessed ?  ?SENSATION: ?Bilateral numbness/tingling in BLEs (RLE> LLE)  ? ?COORDINATION: ?Heel to shin test: WFL bilat, lacking 25% ROM on R side  ? ? ?POSTURE: No Significant postural limitations ? ? ?MMT  taken in seated position:   ? ?MMT Right ?03/10/2022 Left ?03/10/2022  ?Hip flexion 3- 5  ?Hip extension    ?Hip abduction 4- 5  ?Hip adduction 4- 5  ?Hip internal rotation    ?Hip external rotation    ?Knee flexion 4- 5  ?Knee extension 4- 5  ?Ankle dorsiflexion 3 5  ?Ankle plantarflexion 4 5  ?Ankle inversion    ?Ankle eversion    ?(Blank rows = not tested) ? ?BED MOBILITY:  ?Independent per pt ? ?TRANSFERS: ?Assistive device utilized: None  ?Sit to stand: Modified independence ?Stand to sit: Modified independence ?Chair to chair: Modified independence ? ? ?GAIT: ?Gait pattern: step through pattern, decreased arm swing- Right, decreased arm swing- Left, decreased step length- Right, decreased step length- Left, decreased stance time- Right, decreased ankle dorsiflexion- Right, circumduction- Right, Right foot flat, knee flexed in stance- Right, lateral lean- Left, and poor foot clearance- Right ?Distance walked: Various clinic distances  ?Assistive device utilized: None ?Level of assistance: SBA ?Comments: Noted minor knee buckling of RLE from loading response to midstance and wide BOS w/ER of LLE to maintain balance.  ? ?FUNCTIONAL TESTs:  ? [facility] PT Assessment - 03/10/22 1551   ? ?  ? Transfers  ? Five time sit to stand comments  17.09s without BUE support   ?  ? Ambulation/Gait  ? Gait velocity 32.8' over 12.27s = 2.67 ft/s without AD   ? ?  ?  ? ?  ? ? ?TODAY'S TREATMENT:  ?See below  ? ?PATIENT EDUCATION: ?Education details: Eval findings, POC  ?Person educated: Patient ?Education method: Explanation and Demonstration ?Education comprehension: verbalized understanding ? ? ?HOME EXERCISE PROGRAM: ?To be established next session  ? ? ? ?GOALS: ?Goals reviewed with patient? Yes ? ?SHORT TERM GOALS: Target date: 04/07/2022   (Remove Blue Hyperlink) ? ?Pt will perform fall transfer w/min A for improved fall recovery and safety at home  ?Baseline: ?Goal status: INITIAL ? ?2.  Pt will be independent with initial HEP for  improved strength, balance, transfers and gait. ? ?Baseline:  ?Goal status: INITIAL ? ?3.  Pt will  improve 5 x STS to less than or equal to 14 seconds without UE support to demonstrate improved functional strength and transfer efficiency.  ? ?Baseline: 17.09s without UE support  ?Goal status: INITIAL ? ?4.  Pt will perform 6MWT and STG/LTG to be written  ?Baseline:  ?Goal status: INITIAL ? ?5.  FGA to be assessed and STG/LTG to be written  ?Baseline:  ?Goal status: INITIAL ? ?6.  Pt will trial various AFOs and determine need for custom bracing for improved gait efficiency and gait kinematics  ?Baseline:  ?Goal status: INITIAL ? ? ?LONG TERM GOALS: Target date: 05/05/2022   (Remove Blue Hyperlink) ? ?Pt will perform fall transfer mod I for improved fall recovery and safety at home  ?Baseline:  ?Goal status: INITIAL ? ?2.  Pt will be independent with final HEP for improved strength, balance, transfers and gait. ? ?Baseline:  ?Goal status: INITIAL ? ?3.  6MWT goal  ?Baseline:  ?Goal status: INITIAL ? ?4.  FGA goal ?Baseline:  ?Goal status: INITIAL ? ?5.  Pt will improve gait velocity to at least 3.2 ft/s with or without AFO for improved gait efficiency  ? ?Baseline: 2.67 ft/s without AD  ?Goal status: INITIAL ? ? ?ASSESSMENT: ? ?CLINICAL IMPRESSION: ?Patient is a 46 year old female referred to Neuro OPPT for MS. Pt's PMH is significant for: 2 NIDDM, had EBV and Varicella as a child, diagnosed with MS in 2013 after presenting with thoracic transverse myelitis and was found to have multiple other lesions consistent with MS  The following deficits were present during the exam: RLE weakness, impaired sequencing and gait kinematics and impaired balance. Based on gait velocity and multiple almost-falls, pt is an incr risk for falls. Will further assess balance and endurance next session. Pt would benefit from skilled PT to address these impairments and functional limitations to maximize functional mobility  independence ? ? ?OBJECTIVE IMPAIRMENTS Abnormal gait, decreased balance, decreased coordination, decreased knowledge of use of DME, decreased mobility, difficulty walking, decreased strength, increased muscle spasms, impaired f

## 2022-03-17 ENCOUNTER — Ambulatory Visit: Payer: Federal, State, Local not specified - PPO | Admitting: Physical Therapy

## 2022-03-19 ENCOUNTER — Ambulatory Visit: Payer: Federal, State, Local not specified - PPO | Admitting: Physical Therapy

## 2022-03-20 ENCOUNTER — Ambulatory Visit: Payer: Federal, State, Local not specified - PPO | Admitting: Physical Therapy

## 2022-03-20 DIAGNOSIS — R2689 Other abnormalities of gait and mobility: Secondary | ICD-10-CM

## 2022-03-20 DIAGNOSIS — R2681 Unsteadiness on feet: Secondary | ICD-10-CM

## 2022-03-20 DIAGNOSIS — M6281 Muscle weakness (generalized): Secondary | ICD-10-CM

## 2022-03-20 NOTE — Therapy (Signed)
OUTPATIENT PHYSICAL THERAPY TREATMENT NOTE   Patient Name: Karen Oconnor MRN: [id number] DOB:02/05/1976, 46 y.o., female Today's Date: 03/20/2022  PCP: Harvie Heck, MD REFERRING PROVIDER: Asa Lente, MD   END OF SESSION:   PT End of Session - 03/20/22 0847     Visit Number 2    Number of Visits 9   Plus eval   Date for PT Re-Evaluation 06/02/22    Authorization Type BCBS/FEDERAL EMP PPO    PT Start Time 0845    PT Stop Time 0925    PT Time Calculation (min) 40 min    Equipment Utilized During Treatment Gait belt    Activity Tolerance Patient tolerated treatment well    Behavior During Therapy WFL for tasks assessed/performed             Past Medical History:  Diagnosis Date   Diabetes ([facility])    MS (multiple sclerosis) ([facility])    Past Surgical History:  Procedure Laterality Date   ABDOMINAL HYSTERECTOMY  2016   Patient Active Problem List   Diagnosis Date Noted   Transverse myelitis ([facility]) 02/25/2022   Multiple sclerosis ([facility]) 02/25/2022   Right foot drop 02/25/2022   High risk medication use 02/25/2022   Allodynia 02/25/2022    REFERRING DIAG: G35 (ICD-10-CM) - Multiple sclerosis ([facility])   THERAPY DIAG:  Other abnormalities of gait and mobility  Muscle weakness (generalized)  Unsteadiness on feet  Rationale for Evaluation and Treatment Rehabilitation  PERTINENT HISTORY: she presented with electrical sock sensations in her back and legs followed by numbness in the right flank/abdomen and allodynia in her right leg. She also had a right foot drop and spasticity. She also has numbness in her left foot. She was concerned about stroke and saw Dr. Corwin Levins who did an MRI of the cervical/thoracic spine. She had a large non-enhancing lesion (was 3 months after 1st symptom) at T7-T11. MRI of the brain also showed lesions and MS was diagnosed. 2 NIDDM. She had EBV and Varicella as a child   PRECAUTIONS: Fall  SUBJECTIVE: Pt reports she has had a busy week,  went to [facility] last week for dancing competition.   PAIN:  Are you having pain? No   OBJECTIVE:   DIAGNOSTIC FINDINGS: MRI of the brain 12/09/2019 showed multiple T2/FLAIR hyperintense foci in the periventricular, juxtacortical and deep white matter.  2 of the foci, 1 high in the right frontal lobe and and other in the periventricular white matter adjacent to the left ventricular atrium.   MRI of the cervical and thoracic spine 12/09/2019 showed a focus at T7-T8 centrally towards the right and possible focus at C3-C4.     TODAY'S TREATMENT:  Ther Act   [facility] PT Assessment - 03/20/22 0850       6 Minute Walk- Baseline   6 Minute Walk- Baseline yes    Perceived Rate of Exertion ([place]) 7- Very, very light      6 Minute walk- Post Test   6 Minute Walk Post Test yes    Perceived Rate of Exertion ([place]) 11- Fairly light      6 minute walk test results    Aerobic Endurance Distance Walked 1075    Endurance additional comments No AD, mod I. Noted decreased hip/knee flexion of RLE, mild circumduction of RLE, decreased DF of RLE and R lateral lean.      Functional Gait  Assessment   Gait assessed  Yes    Gait Level Surface Walks 20 ft in  less than 7 sec but greater than 5.5 sec, uses assistive device, slower speed, mild gait deviations, or deviates 6-10 in outside of the 12 in walkway width.   6.81s   Change in Gait Speed Able to smoothly change walking speed without loss of balance or gait deviation. Deviate no more than 6 in outside of the 12 in walkway width.    Gait with Horizontal Head Turns Performs head turns smoothly with slight change in gait velocity (eg, minor disruption to smooth gait path), deviates 6-10 in outside 12 in walkway width, or uses an assistive device.   instabilityturning to L side   Gait with Vertical Head Turns Performs head turns with no change in gait. Deviates no more than 6 in outside 12 in walkway width.    Gait and Pivot Turn Pivot turns safely within 3 sec and  stops quickly with no loss of balance.    Step Over Obstacle Is able to step over one shoe box (4.5 in total height) but must slow down and adjust steps to clear box safely. May require verbal cueing.    Gait with Narrow Base of Support Ambulates less than 4 steps heel to toe or cannot perform without assistance.    Gait with Eyes Closed Walks 20 ft, slow speed, abnormal gait pattern, evidence for imbalance, deviates 10-15 in outside 12 in walkway width. Requires more than 9 sec to ambulate 20 ft.   10.19s   Ambulating Backwards Walks 20 ft, uses assistive device, slower speed, mild gait deviations, deviates 6-10 in outside 12 in walkway width.    Steps Alternating feet, must use rail.    Total Score 19    FGA comment: medium fall risk            Ther Ex Initiated and demonstrated initial HEP (see bolded below) for improved BLE strength, single leg stability and balance:  - Standing Hip Abduction with Counter Support  - 10 reps - Standing Hip Extension with Counter Support  - 10 reps - Heel to toe walking   - Down and back counter x2 - Backward heel to toe walking -  Down and back counter x2 - Standing March with Counter Support  - 10 reps per side     PATIENT EDUCATION: Education details: Initial HEP, outcomes of FGA and , implementing walking program (150 min/wk of brisk walking)  Person educated: Patient Education method: Medical illustrator Education comprehension: verbalized understanding     HOME EXERCISE PROGRAM: Access Code: [email or web address] URL: [email or web address] Date: 03/20/2022 Prepared by: Alethia Berthold Jeannifer Drakeford  Exercises - Standing Hip Abduction with Counter Support  - 1 x daily - 7 x weekly - 3 sets - 10 reps - Standing Hip Extension with Counter Support  - 1 x daily - 7 x weekly - 3 sets - 10 reps - Heel to toe walking   - 1 x daily - 7 x weekly - 3 sets - 10 reps - Backward heel to toe walking   - 1 x daily - 7 x weekly - 3 sets - 10 reps -  Standing March with Counter Support  - 1 x daily - 7 x weekly - 3 sets - 10 reps  Plus walking program (150 min/wk of brisk walking)        GOALS: Goals reviewed with patient? Yes   SHORT TERM GOALS: Target date: 04/07/2022   (Remove Blue Hyperlink)   Pt will perform fall transfer w/min A for improved fall recovery and safety  at home  Baseline: Goal status: INITIAL   2.  Pt will be independent with initial HEP for improved strength, balance, transfers and gait.   Baseline:  Goal status: INITIAL   3.  Pt will improve 5 x STS to less than or equal to 14 seconds without UE support to demonstrate improved functional strength and transfer efficiency.    Baseline: 17.09s without UE support  Goal status: INITIAL   4.  Pt will ambulate greater than or equal to 1200 feet on mod I for improved cardiovascular endurance and BLE strength.   Baseline: 1075' mod I, no AD Goal status: INITIAL   5.  Pt will improve FGA to 22/30 for decreased fall risk   Baseline: 19/30 Goal status: INITIAL   6.  Pt will trial various AFOs and determine need for custom bracing for improved gait efficiency and gait kinematics  Baseline:  Goal status: INITIAL     LONG TERM GOALS: Target date: 05/05/2022   (Remove Blue Hyperlink)   Pt will perform fall transfer mod I for improved fall recovery and safety at home  Baseline:  Goal status: INITIAL   2.  Pt will be independent with final HEP for improved strength, balance, transfers and gait.   Baseline:  Goal status: INITIAL   3.  Pt will ambulate greater than or equal to 1500 feet on mod I for improved cardiovascular endurance and BLE strength.   Baseline: 1075' mod I, no AD  Goal status: INITIAL   4.  Pt will improve FGA to 25/30 for decreased fall risk   Baseline: 19/30 Goal status: INITIAL   5.  Pt will improve gait velocity to at least 3.2 ft/s with or without AFO for improved gait efficiency    Baseline: 2.67 ft/s without AD   Goal status: INITIAL     ASSESSMENT:   CLINICAL IMPRESSION: Emphasis of skilled PT session of balance assessment and establishing initial HEP. Pt achieved a 19/30 on FGA, indicative of medium fall risk. Pt demonstrated the greatest difficulty w/tandem gait, obstacle navigation and gait w/EC. Added exercises to initial HEP to address impairments. Continue POC.      OBJECTIVE IMPAIRMENTS Abnormal gait, decreased balance, decreased coordination, decreased knowledge of use of DME, decreased mobility, difficulty walking, decreased strength, increased muscle spasms, impaired flexibility, impaired sensation, and pain.    ACTIVITY LIMITATIONS community activity and driving.    PERSONAL FACTORS Time since onset of injury/illness/exacerbation and Transportation are also affecting patient's functional outcome.      REHAB POTENTIAL: Good   CLINICAL DECISION MAKING: Stable/uncomplicated   EVALUATION COMPLEXITY: Low   PLAN: PT FREQUENCY: 1x/week   PT DURATION: 12 weeks   PLANNED INTERVENTIONS: Therapeutic exercises, Therapeutic activity, Neuromuscular re-education, Balance training, Gait training, Patient/Family education, Stair training, Orthotic/Fit training, DME instructions, and Aquatic Therapy   PLAN FOR NEXT SESSION: Add monster walks to HEP, leg presses, treadmill training, resisted step ups, windshield wipers, bridges on theraball   Danuel Felicetti E Micaiah Litle, PT, DPT  03/20/2022, 9:31 AM

## 2022-03-26 ENCOUNTER — Ambulatory Visit: Payer: Federal, State, Local not specified - PPO | Admitting: Physical Therapy

## 2022-03-31 ENCOUNTER — Ambulatory Visit: Payer: Federal, State, Local not specified - PPO | Attending: Neurology | Admitting: Physical Therapy

## 2022-03-31 DIAGNOSIS — R2681 Unsteadiness on feet: Secondary | ICD-10-CM | POA: Insufficient documentation

## 2022-03-31 DIAGNOSIS — R2689 Other abnormalities of gait and mobility: Secondary | ICD-10-CM | POA: Insufficient documentation

## 2022-03-31 DIAGNOSIS — M6281 Muscle weakness (generalized): Secondary | ICD-10-CM | POA: Insufficient documentation

## 2022-04-07 ENCOUNTER — Ambulatory Visit: Payer: Federal, State, Local not specified - PPO | Admitting: Physical Therapy

## 2022-04-09 ENCOUNTER — Ambulatory Visit: Payer: Federal, State, Local not specified - PPO | Admitting: Physical Therapy

## 2022-04-14 ENCOUNTER — Ambulatory Visit: Payer: Federal, State, Local not specified - PPO | Admitting: Physical Therapy

## 2022-04-14 DIAGNOSIS — R2681 Unsteadiness on feet: Secondary | ICD-10-CM | POA: Diagnosis present

## 2022-04-14 DIAGNOSIS — R2689 Other abnormalities of gait and mobility: Secondary | ICD-10-CM

## 2022-04-14 DIAGNOSIS — M6281 Muscle weakness (generalized): Secondary | ICD-10-CM

## 2022-04-14 NOTE — Therapy (Signed)
OUTPATIENT PHYSICAL THERAPY TREATMENT NOTE   Patient Name: Karen Oconnor MRN: [id number] DOB:[birth year], 46 y.o., female Today's Date: 04/14/2022  PCP: Hermine Messick, MD REFERRING PROVIDER: Britt Bottom, MD   END OF SESSION:   PT End of Session - 04/14/22 1537     Visit Number 3    Number of Visits 9   Plus eval   Date for PT Re-Evaluation 06/02/22    Authorization Type BCBS/F[employer]ERAL [employer] PPO    PT Start Time 1536    PT Stop Time 1615    PT Time Calculation (min) 39 min    Activity Tolerance Patient tolerated treatment well    Behavior During Therapy WFL for tasks assessed/performed             Past Medical History:  Diagnosis Date   Diabetes ([place])    MS (multiple sclerosis) ([place])    Past Surgical History:  Procedure Laterality Date   ABDOMINAL HYSTERECTOMY  2016   Patient Active Problem List   Diagnosis Date Noted   Transverse myelitis ([place]) 02/25/2022   Multiple sclerosis ([place]) 02/25/2022   Right foot drop 02/25/2022   High risk medication use 02/25/2022   Allodynia 02/25/2022    REFERRING DIAG: G35 (ICD-10-CM) - Multiple sclerosis ([place])   THERAPY DIAG:  Other abnormalities of gait and mobility  Muscle weakness (generalized)  Unsteadiness on feet  Rationale for Evaluation and Treatment Rehabilitation  PERTINENT HISTORY: she presented with electrical sock sensations in her back and legs followed by numbness in the right flank/abdomen and allodynia in her right leg. She also had a right foot drop and spasticity. She also has numbness in her left foot. She was concerned about stroke and saw Dr. Hart Robinsons who did an MRI of the cervical/thoracic spine. She had a large non-enhancing lesion (was 3 months after 1st symptom) at T7-T11. MRI of the brain also showed lesions and MS was diagnosed. 2 NIDDM. She had EBV and Varicella as a child   PRECAUTIONS: Fall  SUBJECTIVE: Pt very apologetic for missing previous two appointments. Pt feeling good today,  states her HEP is going well. No new changes   PAIN:  Are you having pain? No   OBJECTIVE:   DIAGNOSTIC FINDINGS: MRI of the brain 12/09/2019 showed multiple T2/FLAIR hyperintense foci in the periventricular, juxtacortical and deep white matter.  2 of the foci, 1 high in the right frontal lobe and and other in the periventricular white matter adjacent to the left ventricular atrium.   MRI of the cervical and thoracic spine 12/09/2019 showed a focus at T7-T8 centrally towards the right and possible focus at C3-C4.     TODAY'S TREATMENT:  Ther Act   [facility] PT Assessment - 04/14/22 1544       Transfers   Five time sit to stand comments  13.32s without UE support      6 minute walk test results    Aerobic Endurance Distance Walked 1265    Endurance additional comments No AD, mod I. Noted decreased hip/knee flexion of RLE, minor circumduction of RLE, decreased DF of RLE and R lateral lean.      Functional Gait  Assessment   Gait assessed  Yes    Gait Level Surface Walks 20 ft in less than 7 sec but greater than 5.5 sec, uses assistive device, slower speed, mild gait deviations, or deviates 6-10 in outside of the 12 in walkway width.   6.47s   Change in Gait Speed Able to change speed, demonstrates  mild gait deviations, deviates 6-10 in outside of the 12 in walkway width, or no gait deviations, unable to achieve a major change in velocity, or uses a change in velocity, or uses an assistive device.    Gait with Horizontal Head Turns Performs head turns smoothly with no change in gait. Deviates no more than 6 in outside 12 in walkway width    Gait with Vertical Head Turns Performs head turns with no change in gait. Deviates no more than 6 in outside 12 in walkway width.    Gait and Pivot Turn Pivot turns safely within 3 sec and stops quickly with no loss of balance.    Step Over Obstacle Is able to step over one shoe box (4.5 in total height) but must slow down and adjust steps to clear box  safely. May require verbal cueing.    Gait with Narrow Base of Support Ambulates less than 4 steps heel to toe or cannot perform without assistance.    Gait with Eyes Closed Walks 20 ft, slow speed, abnormal gait pattern, evidence for imbalance, deviates 10-15 in outside 12 in walkway width. Requires more than 9 sec to ambulate 20 ft.   10.72s, lateral deviation to L   Ambulating Backwards Walks 20 ft, uses assistive device, slower speed, mild gait deviations, deviates 6-10 in outside 12 in walkway width.   13s   Steps Alternating feet, must use rail.    Total Score 19    FGA comment: medium fall risk             Ther Ex  Added balance exercises to HEP for improved narrow BOS and vestibular input (see bolded below).     PATIENT EDUCATION: Education details: Goal assessment, additions to HEP Person educated: Patient Education method: Customer service manager and Handout  Education comprehension: verbalized understanding     HOME EXERCISE PROGRAM: Access Code: [email or web address] URL: [email or web address] Date: 03/20/2022 Prepared by: Mickie Bail Plaster  Exercises - Standing Hip Abduction with Counter Support  - 1 x daily - 7 x weekly - 3 sets - 10 reps - Standing Hip Extension with Counter Support  - 1 x daily - 7 x weekly - 3 sets - 10 reps - Heel to toe walking   - 1 x daily - 7 x weekly - 3 sets - 10 reps - Backward heel to toe walking   - 1 x daily - 7 x weekly - 3 sets - 10 reps - Standing March with Counter Support  - 1 x daily - 7 x weekly - 3 sets - 10 reps - Standing on pillows with eyes closed in corner   - 1 x daily - 7 x weekly - 3 sets - 30 second hold - Standing on pillows with eyes open in corner   - 1 x daily - 7 x weekly - 3 sets - 30 second hold  Plus walking program (150 min/wk of brisk walking)        GOALS: Goals reviewed with patient? Yes   SHORT TERM GOALS: Target date: 04/07/2022   (Remove Blue Hyperlink)   Pt will perform fall transfer  w/min A for improved fall recovery and safety at home  Baseline: Goal status: PROGRESSING   2.  Pt will be independent with initial HEP for improved strength, balance, transfers and gait.   Baseline:  Goal status: MET   3.  Pt will improve 5 x STS to less than or equal to 14 seconds without UE support  to demonstrate improved functional strength and transfer efficiency.    Baseline: 17.09s without UE support; 13.31s without UE support  Goal status: MET   4.  Pt will ambulate greater than or equal to 1200 feet on 6MWT mod I for improved cardiovascular endurance and BLE strength.   Baseline: 1075' mod I, no AD; 1275' mod I, no AD  Goal status: MET    5.  Pt will improve FGA to 22/30 for decreased fall risk   Baseline: 19/30; 19/30 on 6/20  Goal status: PROGRESSING    6.  Pt will trial various AFOs and determine need for custom bracing for improved gait efficiency and gait kinematics  Baseline:  Goal status: PROGRESSING      LONG TERM GOALS: Target date: 05/05/2022   (Remove Blue Hyperlink)   Pt will perform fall transfer mod I for improved fall recovery and safety at home  Baseline:  Goal status: INITIAL   2.  Pt will be independent with final HEP for improved strength, balance, transfers and gait.   Baseline:  Goal status: INITIAL   3.  Pt will ambulate greater than or equal to 1500 feet on 6MWT mod I for improved cardiovascular endurance and BLE strength.   Baseline: 1075' mod I, no AD  Goal status: INITIAL   4.  Pt will improve FGA to 25/30 for decreased fall risk   Baseline: 19/30 Goal status: INITIAL   5.  Pt will improve gait velocity to at least 3.2 ft/s with or without AFO for improved gait efficiency    Baseline: 2.67 ft/s without AD  Goal status: INITIAL     ASSESSMENT:   CLINICAL IMPRESSION: Emphasis of skilled PT session on STG assessment and updating HEP. Pt has met 3/6 STGs and is progressing the remaining 3. Pt has missed past 2 appointments due  to being late and has only had one treatment session since eval. Pt has decreased her 5x STS time, increased her distance on 6MWT and is performing HEP independently. Pt scored the same on FGA and has not performed a floor transfer or trialed AFOs yet due to missing appointments. Pt continued to be limited by poor vestibular function and instability with narrow BOS, added to HEP. Continue POC.      OBJECTIVE IMPAIRMENTS Abnormal gait, decreased balance, decreased coordination, decreased knowledge of use of DME, decreased mobility, difficulty walking, decreased strength, increased muscle spasms, impaired flexibility, impaired sensation, and pain.    ACTIVITY LIMITATIONS community activity and driving.    PERSONAL FACTORS Time since onset of injury/illness/exacerbation and Transportation are also affecting patient's functional outcome.      REHAB POTENTIAL: Good   CLINICAL DECISION MAKING: Stable/uncomplicated   EVALUATION COMPLEXITY: Low   PLAN: PT FREQUENCY: 1x/week   PT DURATION: 12 weeks   PLANNED INTERVENTIONS: Therapeutic exercises, Therapeutic activity, Neuromuscular re-education, Balance training, Gait training, Patient/Family education, Stair training, Orthotic/Fit training, DME instructions, and Aquatic Therapy   PLAN FOR NEXT SESSION: Add balance (Vestibular), RLE ROM to HEP. Rockerboard (EO/EC) and air ex tasks. Trial gait w/AFO on RLE. leg presses, treadmill training, resisted step ups, windshield wipers, bridges on theraball   Driana Dazey E Jashanti Clinkscale, PT, DPT  04/14/2022, 4:37 PM

## 2022-04-21 ENCOUNTER — Encounter: Payer: Self-pay | Admitting: Physical Therapy

## 2022-04-21 ENCOUNTER — Ambulatory Visit: Payer: Federal, State, Local not specified - PPO | Admitting: Physical Therapy

## 2022-04-21 DIAGNOSIS — R2689 Other abnormalities of gait and mobility: Secondary | ICD-10-CM

## 2022-04-21 DIAGNOSIS — R2681 Unsteadiness on feet: Secondary | ICD-10-CM

## 2022-04-21 DIAGNOSIS — M6281 Muscle weakness (generalized): Secondary | ICD-10-CM

## 2022-05-05 ENCOUNTER — Encounter: Payer: Self-pay | Admitting: Physical Therapy

## 2022-05-05 ENCOUNTER — Ambulatory Visit: Payer: Federal, State, Local not specified - PPO | Attending: Neurology | Admitting: Physical Therapy

## 2022-05-05 DIAGNOSIS — M6281 Muscle weakness (generalized): Secondary | ICD-10-CM

## 2022-05-05 DIAGNOSIS — R2689 Other abnormalities of gait and mobility: Secondary | ICD-10-CM

## 2022-05-05 DIAGNOSIS — R2681 Unsteadiness on feet: Secondary | ICD-10-CM | POA: Diagnosis present

## 2022-05-05 NOTE — Therapy (Signed)
OUTPATIENT PHYSICAL THERAPY TREATMENT NOTE   Patient Name: Karen Oconnor MRN: [id number] DOB:[birth year], 46 y.o., female Today's Date: 05/05/2022  PCP: Hermine Messick, MD REFERRING PROVIDER: Britt Bottom, MD   PHYSICAL THERAPY DISCHARGE SUMMARY  Visits from Start of Care: 5  Current functional level related to goals / functional outcomes: See clinical impression statement.   Remaining deficits: Need for rail with stairs, noted gait deficits with fatigue   Education / Equipment: Brief floor recovery sequencing edu, d/c plan, progress towards goals, return to community gym with review of pt routine.   Patient agrees to discharge. Patient goals were partially met. Patient is being discharged due to maximized rehab potential.    END OF SESSION:   PT End of Session - 05/05/22 1540     Visit Number 5    Number of Visits 9   Plus eval   Date for PT Re-Evaluation 06/02/22    Authorization Type BCBS/FEDERAL EMP PPO    PT Start Time 1537   Pt late   PT Stop Time 1622    PT Time Calculation (min) 45 min    Equipment Utilized During Treatment Gait belt    Activity Tolerance Patient tolerated treatment well    Behavior During Therapy WFL for tasks assessed/performed             Past Medical History:  Diagnosis Date   Diabetes ([place])    MS (multiple sclerosis) ([place])    Past Surgical History:  Procedure Laterality Date   ABDOMINAL HYSTERECTOMY  2016   Patient Active Problem List   Diagnosis Date Noted   Transverse myelitis ([place]) 02/25/2022   Multiple sclerosis ([place]) 02/25/2022   Right foot drop 02/25/2022   High risk medication use 02/25/2022   Allodynia 02/25/2022    REFERRING DIAG: G35 (ICD-10-CM) - Multiple sclerosis ([place])   THERAPY DIAG:  Other abnormalities of gait and mobility  Muscle weakness (generalized)  Unsteadiness on feet  Rationale for Evaluation and Treatment Rehabilitation  PERTINENT HISTORY: she presented with electrical shock sensations  in her back and legs followed by numbness in the right flank/abdomen and allodynia in her right leg. She also had a right foot drop and spasticity. She also has numbness in her left foot. She was concerned about stroke and saw Dr. Hart Robinsons who did an MRI of the cervical/thoracic spine. She had a large non-enhancing lesion (was 3 months after 1st symptom) at T7-T11. MRI of the brain also showed lesions and MS was diagnosed. 2 NIDDM. She had EBV and Varicella as a child   PRECAUTIONS: Fall  SUBJECTIVE: She has been out of town in [place] and she danced 4 days in a row, some days until 4am.  She has been managing her pain from this independently.  PAIN:  Are you having pain? No  OBJECTIVE:   DIAGNOSTIC FINDINGS: MRI of the brain 12/09/2019 showed multiple T2/FLAIR hyperintense foci in the periventricular, juxtacortical and deep white matter.  2 of the foci, 1 high in the right frontal lobe and and other in the periventricular white matter adjacent to the left ventricular atrium.   MRI of the cervical and thoracic spine 12/09/2019 showed a focus at T7-T8 centrally towards the right and possible focus at C3-C4.     TODAY'S TREATMENT:  Assessed LTGs: -Pt states she has not done HEP while out-of-town, but has maintained activity and was compliant prior to trip. -Fall recovery mod I, provided brief continued edu on sequencing in event of fall during dancing or  otherwise -Assessed FGA:  [facility] PT Assessment - 05/05/22 1606       Functional Gait  Assessment   Gait assessed  Yes    Gait Level Surface Walks 20 ft in less than 5.5 sec, no assistive devices, good speed, no evidence for imbalance, normal gait pattern, deviates no more than 6 in outside of the 12 in walkway width.   deviations noted with fatigue   Change in Gait Speed Able to smoothly change walking speed without loss of balance or gait deviation. Deviate no more than 6 in outside of the 12 in walkway width.    Gait with Horizontal  Head Turns Performs head turns smoothly with no change in gait. Deviates no more than 6 in outside 12 in walkway width    Gait with Vertical Head Turns Performs head turns with no change in gait. Deviates no more than 6 in outside 12 in walkway width.    Gait and Pivot Turn Pivot turns safely within 3 sec and stops quickly with no loss of balance.    Step Over Obstacle Is able to step over one shoe box (4.5 in total height) but must slow down and adjust steps to clear box safely. May require verbal cueing.    Gait with Narrow Base of Support Ambulates less than 4 steps heel to toe or cannot perform without assistance.    Gait with Eyes Closed Walks 20 ft, uses assistive device, slower speed, mild gait deviations, deviates 6-10 in outside 12 in walkway width. Ambulates 20 ft in less than 9 sec but greater than 7 sec.    Ambulating Backwards Walks 20 ft, no assistive devices, good speed, no evidence for imbalance, normal gait    Steps Alternating feet, must use rail.    Total Score 23    FGA comment: medium fall risk           -Assessed 10MWT:  9.19 sec = 1.09 m/sec OR 3.59 ft/sec  GAIT: Gait pattern: step through pattern, decreased stance time- Left, and knee flexed in stance- Left Distance walked: 1,495' total (1438' in 6MWT) Assistive device utilized: None Level of assistance: Complete Independence   PATIENT EDUCATION: Education details: Continue HEP.  D/C plan, discussed routine for potential return to gym to maintain strength gains and work on endurance independently. Person educated: Patient Education method: Customer service manager and Handout  Education comprehension: verbalized understanding     HOME EXERCISE PROGRAM: Access Code: [email or web address] URL: [email or web address] Date: 03/20/2022 Prepared by: Mickie Bail Plaster  Exercises - Standing Hip Abduction with Counter Support  - 1 x daily - 7 x weekly - 3 sets - 10 reps - Standing Hip Extension with Counter  Support  - 1 x daily - 7 x weekly - 3 sets - 10 reps - Heel to toe walking   - 1 x daily - 7 x weekly - 3 sets - 10 reps - Backward heel to toe walking   - 1 x daily - 7 x weekly - 3 sets - 10 reps - Standing March with Counter Support  - 1 x daily - 7 x weekly - 3 sets - 10 reps - Standing on pillows with eyes closed in corner   - 1 x daily - 7 x weekly - 3 sets - 30 second hold - Standing on pillows with eyes open in corner   - 1 x daily - 7 x weekly - 3 sets - 30 second hold  Plus walking program (150 min/wk of  brisk walking)        GOALS: Goals reviewed with patient? Yes   SHORT TERM GOALS: Target date: 04/07/2022   (Remove Blue Hyperlink)   Pt will perform fall transfer w/min A for improved fall recovery and safety at home  Baseline: Goal status: PROGRESSING   2.  Pt will be independent with initial HEP for improved strength, balance, transfers and gait.   Baseline:  Goal status: MET   3.  Pt will improve 5 x STS to less than or equal to 14 seconds without UE support to demonstrate improved functional strength and transfer efficiency.    Baseline: 17.09s without UE support; 13.31s without UE support  Goal status: MET   4.  Pt will ambulate greater than or equal to 1200 feet on 6MWT mod I for improved cardiovascular endurance and BLE strength.   Baseline: 1075' mod I, no AD; 1275' mod I, no AD  Goal status: MET    5.  Pt will improve FGA to 22/30 for decreased fall risk   Baseline: 19/30; 19/30 on 6/20  Goal status: PROGRESSING    6.  Pt will trial various AFOs and determine need for custom bracing for improved gait efficiency and gait kinematics  Baseline:  Goal status: PROGRESSING      LONG TERM GOALS: Target date: 05/05/2022   (Remove Blue Hyperlink)   Pt will perform fall transfer mod I for improved fall recovery and safety at home  Baseline:  Goal status: MET   2.  Pt will be independent with final HEP for improved strength, balance, transfers and gait.    Baseline:  Goal status: MET   3.  Pt will ambulate greater than or equal to 1500 feet on 6MWT mod I for improved cardiovascular endurance and BLE strength.   Baseline: 1075' mod I, no AD;  1,438' Independent no AD Goal status: PARTIALLY MET   4.  Pt will improve FGA to 25/30 for decreased fall risk   Baseline: 19/30; 23/30 05/05/2022 Goal status: PARTIALLY MET   5.  Pt will improve gait velocity to at least 3.2 ft/s with or without AFO for improved gait efficiency    Baseline: 2.67 ft/s without AD; 3.59 ft/sec w/o AD or AFO Goal status: MET     ASSESSMENT:   CLINICAL IMPRESSION: Assessment of LTGs completed this session with preparation for discharge today.  Her gait velocity has greatly improved beyond that of goal level to 3.59 ft/sec without need for AD or AFO use.  She improved her FGA score to 23/30 just shy of goal level.  She remains challenged by obstacle approach, need for rail with stairs, and narrowed BOS.  She is able to ambulate 1438' in 6 minutes and 1495' continuously.  She remains active outside the clinic, compliant to HEP, and has plans to return to community fitness at a local gym with her daughter.  At this time, patient is appropriate for and in agreement to discharge.     OBJECTIVE IMPAIRMENTS Abnormal gait, decreased balance, decreased coordination, decreased knowledge of use of DME, decreased mobility, difficulty walking, decreased strength, increased muscle spasms, impaired flexibility, impaired sensation, and pain.    ACTIVITY LIMITATIONS community activity and driving.    PERSONAL FACTORS Time since onset of injury/illness/exacerbation and Transportation are also affecting patient's functional outcome.      REHAB POTENTIAL: Good   CLINICAL DECISION MAKING: Stable/uncomplicated   EVALUATION COMPLEXITY: Low   PLAN: PT FREQUENCY: 1x/week   PT DURATION: 12 weeks  PLANNED INTERVENTIONS: Therapeutic exercises, Therapeutic activity, Neuromuscular  re-education, Balance training, Gait training, Patient/Family education, Stair training, Orthotic/Fit training, DME instructions, and Aquatic Therapy   PLAN FOR NEXT SESSION: N/A  Bary Richard, PT, DPT  05/05/2022, 5:39 PM

## 2022-05-12 ENCOUNTER — Ambulatory Visit: Payer: Federal, State, Local not specified - PPO | Admitting: Physical Therapy

## 2022-06-10 ENCOUNTER — Encounter: Payer: Self-pay | Admitting: Neurology

## 2022-07-29 ENCOUNTER — Encounter: Payer: Self-pay | Admitting: *Deleted

## 2022-09-23 ENCOUNTER — Encounter: Payer: Self-pay | Admitting: Neurology

## 2022-09-23 ENCOUNTER — Ambulatory Visit (INDEPENDENT_AMBULATORY_CARE_PROVIDER_SITE_OTHER): Payer: Federal, State, Local not specified - PPO | Admitting: Neurology

## 2022-09-23 VITALS — BP 145/98 | HR 82 | Ht 66.0 in | Wt 208.0 lb

## 2022-09-23 DIAGNOSIS — G35 Multiple sclerosis: Secondary | ICD-10-CM

## 2022-09-23 DIAGNOSIS — Z79899 Other long term (current) drug therapy: Secondary | ICD-10-CM

## 2022-09-23 DIAGNOSIS — R208 Other disturbances of skin sensation: Secondary | ICD-10-CM | POA: Diagnosis not present

## 2022-09-23 DIAGNOSIS — M21371 Foot drop, right foot: Secondary | ICD-10-CM | POA: Diagnosis not present

## 2022-09-23 NOTE — Progress Notes (Signed)
[facility]  PATIENT: Karen Oconnor DOB: January 25, 1976  REFERRING DOCTOR OR PCP: Harvie Heck, MD; Megan Mans, NP SOURCE: Patient, notes from [facility] between 2013 and present, imaging and lab reports, MRI images will be personally reviewed when available an addendum will be added  _________________________________   HISTORICAL  CHIEF COMPLAINT:  Chief Complaint  Patient presents with   Follow-up    RM 1, alone. Last seen 02/25/22. MS DMT: Ocrevus. No new sx, doing well. Tolerating infusions well.    HISTORY OF PRESENT ILLNESS:  Karen Oconnor is a 46 y.o. woman with multiple sclerosis.  UPDATE 09/23/2022: She is currently on Ocrevus and tolerates it very well.    Last infusion was late October, 2023.   She has no infusion reactions. She has had no exacerbations since starting in 2021.     At the last visit I checked anti-NMO and anti-MOG and both are negative.  IgG and IgM are in the normal range.     Gait is the same. She did PT.    She stumbles and had one fall.   She has a right foot drop.      She has altered sensation sensation in her right leg and a different altered sensation right flank with numbness.   These tend to fluctuate.     Bladder function is doing ok with some urgency but no recent incontinence.   She had some bowel incontinence while on Metformin but none off of it.     Vision was poor as a child and she feels it has been stable and she is much better with contacts.     Color vision is fine.   She never had diplopia.     She denies limiting fatigue.   She has some insomnia, sleep onset worse than maintenance.About 3 nights a month.  Mood and cognition are doing well.    She also has type 2 NIDDM.   She had EBV and Varicella as a child.    Weight is up a few pounds the last 6 months and she has had mild HTN.  MS HISTORY In July 2013, she presented with electrical sock sensations in her back and legs followed by numbness in the right  flank/abdomen and allodynia in her right leg.   She also had a right foot drop and spasticity.   She also has numbness in her left foot.   She was concerned about stroke and saw Dr. Corwin Levins who did an MRI of the cervical/thoracic spine.   She had a large non-enhancing lesion (was 3 months after 1st symptom) at T7-T11.   MRI of the brain also showed lesions and MS was diagnosed.   She was initially placed on Tecfidera and stayed on that medication until March 2021 after an MRI February 2021 showed additional multiple sclerosis foci.  She was placed on Ocrevus March 2021 and continues on that medication.  She has been seeing Dr. Melrose Nakayama at [facility].   Imaging (report): MRI of the brain 12/09/2019 showed multiple T2/FLAIR hyperintense foci in the periventricular, juxtacortical and deep white matter.  2 of the foci, 1 high in the right frontal lobe and and other in the periventricular white matter adjacent to the left ventricular atrium.  MRI of the cervical and thoracic spine 12/09/2019 showed a focus at T7-T8 centrally towards the right and possible focus at C3-C4.  MRI report from 08/24/2012 showed multiple T2/FLAIR hyperintense foci in the cerebral hemispheres consistent with MS with  enhancement of a focus in the left parietal lobe.  MRI of the cervical and thoracic spine 08/11/2012 shows a right hemicord focus from T7-T11 that did not enhance.  The cervical spinal cord was normal.  Laboratory tests: 11/28/2019 Quantiferon TB and hepatitis B and C tests were negative.  IgG and IgM and IgA were normal.  08/30/2012: Anti-NMO IgG was negative  REVIEW OF SYSTEMS: Constitutional: No fevers, chills, sweats, or change in appetite Eyes: No visual changes, double vision, eye pain Ear, nose and throat: No hearing loss, ear pain, nasal congestion, sore throat Cardiovascular: No chest pain, palpitations Respiratory:  No shortness of breath at rest or with exertion.   No wheezes GastrointestinaI: No  nausea, vomiting, diarrhea, abdominal pain, fecal incontinence Genitourinary:  No dysuria, urinary retention or frequency.  No nocturia. Musculoskeletal:  No neck pain, back pain Integumentary: No rash, pruritus, skin lesions Neurological: as above Psychiatric: No depression at this time.  No anxiety Endocrine: No palpitations, diaphoresis, change in appetite, change in weigh or increased thirst Hematologic/Lymphatic:  No anemia, purpura, petechiae. Allergic/Immunologic: No itchy/runny eyes, nasal congestion, recent allergic reactions, rashes  ALLERGIES: No Known Allergies  HOME MEDICATIONS:  Current Outpatient Medications:    Alogliptin Benzoate 25 MG TABS, Take 1 tablet by mouth daily., Disp: , Rfl:    baclofen (LIORESAL) 10 MG tablet, Take 10 mg by mouth as needed., Disp: , Rfl:    Biotin 10 MG CAPS, Take 10,000 mcg by mouth daily., Disp: , Rfl:    Cholecalciferol 25 MCG (1000 UT) capsule, Take 5,000 Units by mouth daily., Disp: , Rfl:    FARXIGA 10 MG TABS tablet, Take 10 mg by mouth daily., Disp: , Rfl:    gabapentin (NEURONTIN) 300 MG capsule, Take 900 mg by mouth 2 (two) times daily., Disp: , Rfl:    naproxen (NAPROSYN) 250 MG tablet, Take 250 mg by mouth as needed., Disp: , Rfl:    ocrelizumab (OCREVUS) 300 MG/10ML injection, Inject 600 mg into the vein every 6 (six) months., Disp: , Rfl:   PAST MEDICAL HISTORY: Past Medical History:  Diagnosis Date   Diabetes (HCC)    MS (multiple sclerosis) (HCC)     PAST SURGICAL HISTORY:   FAMILY HISTORY: Family History  Problem Relation Age of Onset   Gout Mother    Diabetes Father     SOCIAL HISTORY:  Social History   Socioeconomic History   Marital status: Single    Spouse name: Not on file   Number of children: Not on file   Years of education: Not on file   Highest education level: Not on file  Occupational History   Not on file  Tobacco Use   Smoking status: Never   Smokeless tobacco: Never  Substance and  Sexual Activity   Alcohol use: Yes    Comment: rare   Drug use: Never   Sexual activity: Not on file  Other Topics Concern   Not on file  Social History Narrative   Right handed   Caffeine use: coffee/tea daily   Social Determinants of Health   Financial Resource Strain: Not on file  Food Insecurity: Not on file  Transportation Needs: Not on file  Physical Activity: Not on file  Stress: Not on file  Social Connections: Not on file  Intimate Partner Violence: Not on file     PHYSICAL EXAM  Vitals:   09/23/22 1600  BP: (!) 145/98  Pulse: 82  Weight: 208 lb (94.3 kg)  Height: 5'  6" (1.676 m)    Body mass index is 33.57 kg/m.     General: The patient is well-developed and well-nourished and in no acute distress  HEENT:  Head is [place]/AT.  Sclera are anicteric.  Funduscopic exam shows normal optic discs and retinal vessels.  Neck: No carotid bruits are noted.  The neck is nontender.  Cardiovascular: The heart has a regular rate and rhythm with a normal S1 and S2. There were no murmurs, gallops or rubs.    Skin: Extremities are without rash or  edema.  Musculoskeletal:  Back is nontender  Neurologic Exam  Mental status: The patient is alert and oriented x 3 at the time of the examination. The patient has apparent normal recent and remote memory, with an apparently normal attention span and concentration ability.   Speech is normal.  Cranial nerves: Extraocular movements are full. Pupils are equal, round, and reactive to light and accomodation.  V visual acuity was reduced OD versus OS.  Color vision was symmetric.  There is good facial sensation to soft touch bilaterally.Facial strength is normal.  Trapezius and sternocleidomastoid strength is normal. No dysarthria is noted.  The tongue is midline, and the patient has symmetric elevation of the soft palate. No obvious hearing deficits are noted.  Motor:  Muscle bulk is normal.   Tone is increased in the right leg. .  Strength is  5 / 5 in arms and left leg but 4/5 proximal right leg and 4/5 EHL and ankle extension, 4+/5 in there muscles.  Sensory: She reported mild altered sensation in the right flank as well as altered sensation in the right leg and both feet.  Vibration sensation was minimally reduced in the right foot and temperature sensation felt different in the legs than the arms but was not necessarily reduced.  Coordination: Cerebellar testing reveals good finger-nose-finger and reduced right heel-to-shin bilaterally.  Gait and station: Station is normal.  She has a right foot drop and spastic right gait.  The tandem gait is poor.  Romberg was negative. . Reflexes: Deep tendon reflexes are symmetric and normal n arms but increased 3+ right and 3 left knee and anle.  Plantar responses are flexor.     ASSESSMENT AND PLAN  Multiple sclerosis (HCC)  Right foot drop  High risk medication use  Allodynia  Continue Ocrevus.  IgG and IgM were well within normal range when last checked.  This will need to be checked again around the time of the next visit.  We will also check an MRI of the brain around the time of next visit to determine if there is any subclinical progression. Stay active and exercise as tolerated. Return in 6 months or sooner for new or worsening neurologic symptoms.  Christipher Rieger A. Epimenio Foot, MD, [facility] 09/23/2022, 4:08 PM Certified in Neurology, Clinical Neurophysiology, Sleep Medicine and Neuroimaging  [facility] [address], [address] [place], [place] [address] [phone]

## 2023-02-22 ENCOUNTER — Encounter: Payer: Self-pay | Admitting: Neurology

## 2023-02-23 ENCOUNTER — Other Ambulatory Visit: Payer: Self-pay | Admitting: *Deleted

## 2023-02-23 MED ORDER — NAPROXEN 250 MG PO TABS: 250.0000 mg | ORAL_TABLET | Freq: Two times a day (BID) | ORAL | 1 refills | Status: AC | PRN

## 2023-02-23 MED ORDER — BACLOFEN 10 MG PO TABS: 10.0000 mg | ORAL_TABLET | Freq: Three times a day (TID) | ORAL | 1 refills | Status: DC | PRN

## 2023-02-23 MED ORDER — GABAPENTIN 300 MG PO CAPS: 900.0000 mg | ORAL_CAPSULE | Freq: Two times a day (BID) | ORAL | 1 refills | Status: DC

## 2023-02-24 ENCOUNTER — Other Ambulatory Visit: Payer: Self-pay | Admitting: *Deleted

## 2023-02-24 DIAGNOSIS — M21371 Foot drop, right foot: Secondary | ICD-10-CM

## 2023-02-24 DIAGNOSIS — G35 Multiple sclerosis: Secondary | ICD-10-CM

## 2023-02-24 DIAGNOSIS — R269 Unspecified abnormalities of gait and mobility: Secondary | ICD-10-CM

## 2023-03-02 ENCOUNTER — Encounter: Payer: Self-pay | Admitting: Physical Therapy

## 2023-03-02 ENCOUNTER — Ambulatory Visit: Payer: Federal, State, Local not specified - PPO | Attending: Neurology | Admitting: Physical Therapy

## 2023-03-02 ENCOUNTER — Other Ambulatory Visit: Payer: Self-pay

## 2023-03-02 DIAGNOSIS — R2681 Unsteadiness on feet: Secondary | ICD-10-CM | POA: Insufficient documentation

## 2023-03-02 DIAGNOSIS — R269 Unspecified abnormalities of gait and mobility: Secondary | ICD-10-CM | POA: Diagnosis not present

## 2023-03-02 DIAGNOSIS — G35 Multiple sclerosis: Secondary | ICD-10-CM | POA: Insufficient documentation

## 2023-03-02 DIAGNOSIS — M6281 Muscle weakness (generalized): Secondary | ICD-10-CM

## 2023-03-02 DIAGNOSIS — R2689 Other abnormalities of gait and mobility: Secondary | ICD-10-CM

## 2023-03-02 DIAGNOSIS — M21371 Foot drop, right foot: Secondary | ICD-10-CM | POA: Diagnosis not present

## 2023-03-02 NOTE — Therapy (Signed)
OUTPATIENT PHYSICAL THERAPY NEURO EVALUATION   Patient Name: Karen Oconnor MRN: [id number] DOB:Oct 15, 1976, 47 y.o., female Today's Date: 03/02/2023   PCP: Harvie Heck, MD REFERRING PROVIDER: Despina Arias, MD  END OF SESSION:  PT End of Session - 03/02/23 1057     Visit Number 1    Number of Visits 16    Date for PT Re-Evaluation 04/27/23    Authorization Type [employer]    PT Start Time 1100    PT Stop Time 1145    PT Time Calculation (min) 45 min    Activity Tolerance Patient tolerated treatment well    Behavior During Therapy WFL for tasks assessed/performed             Past Medical History:  Diagnosis Date   Diabetes (HCC)    MS (multiple sclerosis) (HCC)    Past Surgical History:  Procedure Laterality Date   ABDOMINAL HYSTERECTOMY  2016   Patient Active Problem List   Diagnosis Date Noted   Transverse myelitis (HCC) 02/25/2022   Multiple sclerosis (HCC) 02/25/2022   Right foot drop 02/25/2022   High risk medication use 02/25/2022   Allodynia 02/25/2022    ONSET DATE: 2013  REFERRING DIAG:  G35 (ICD-10-CM) - Multiple sclerosis (HCC)  M21.371 (ICD-10-CM) - Right foot drop  R26.9 (ICD-10-CM) - Gait difficulty    THERAPY DIAG:  Other abnormalities of gait and mobility  Muscle weakness (generalized)  Unsteadiness on feet  Rationale for Evaluation and Treatment: Rehabilitation  SUBJECTIVE:                                                                                                                                                                                             SUBJECTIVE STATEMENT: "The main thing that brings me in is coordination, balance, strength, and flexibility." Pt reports weakness on R side. Pt states she feels that things have declined since COVID 2020 (reports she had stopped going to PT during that time). Getting in/out of a car is hard, stairs are hard. Pt does have N/T in both feet. Pt states she does not have an AFO. Sometimes  has dizziness and near falls. Pt accompanied by: self  PERTINENT HISTORY: Per H&P: was diagnosed with MS in 2013 after presenting with thoracic transverse myelitis and was found to have multiple other lesions consistent with MS and right leg spasticity/foot drop. She presented with electrical sock sensations in her back and legs followed by numbness in the right flank/abdomen and allodynia in her right leg. She also had a right foot drop and spasticity. She also has numbness in her left foot. She was concerned about  stroke and saw Dr. Corwin Levins who did an MRI of the cervical/thoracic spine. She had a large non-enhancing lesion (was 3 months after 1st symptom) at T7-T11. MRI of the brain also showed lesions and MS was diagnosed. 2 NIDDM. She had EBV and Varicella as a child   PAIN:  Are you having pain? No  PRECAUTIONS: Fall  WEIGHT BEARING RESTRICTIONS: No  FALLS: Has patient fallen in last 6 months? 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, contracting 3x/wk   PATIENT GOALS:   OBJECTIVE:   DIAGNOSTIC FINDINGS: Nothing in 2024  COGNITION: Overall cognitive status: Within functional limits for tasks assessed   SENSATION: Symmetrical lateral calf into bottom of feet  COORDINATION: WFL  EDEMA:  None noted  MUSCLE TONE: No tone noted on screening R LE  MUSCLE LENGTH: Hamstrings: Right 70 deg; Left 70 deg Thomas test: did not assess  DTRs:  Did not assess  POSTURE: weight shift left and R LE abducted, foot in pronation/ER  LOWER EXTREMITY ROM:     Active  Right Eval Left Eval  Hip flexion 95 115  Hip extension    Hip abduction    Hip adduction    Hip internal rotation    Hip external rotation    Knee flexion    Knee extension     Ankle dorsiflexion 5 15  Ankle plantarflexion    Ankle inversion    Ankle eversion     (Blank rows = not tested)  LOWER EXTREMITY MMT:    MMT Right Eval Left Eval  Hip flexion 3- 5  Hip extension 3+ 3-  Hip abduction 3+ 3  Hip adduction    Hip internal rotation 3 5  Hip external rotation 3 3+  Knee flexion 3+ 5  Knee extension 4 5  Ankle dorsiflexion    Ankle plantarflexion    Ankle inversion    Ankle eversion    (Blank rows = not tested)  BED MOBILITY:  Independent  TRANSFERS: Assistive device utilized: None  Sit to stand: Modified independence Stand to sit: Modified independence Chair to chair: Modified independence Floor:  needs assessment  RAMP: Did not assess  CURB: Did not assess  STAIRS: Level of Assistance: Modified independence Stair Negotiation Technique: Step to Pattern Alternating Pattern  with Bilateral Rails Number of Stairs: 10  Height of Stairs: 4-6"  Comments: Step to pattern on ascent; reciprocal pattern on descent. Needs use of bilat UE support  GAIT: Gait pattern: step through pattern, decreased stance time- Right, decreased hip/knee flexion- Right, decreased ankle dorsiflexion- Right, circumduction- Right, lateral hip instability, lateral lean- Right, and lateral lean- Left; R foot externally rotated Distance walked: 75'x4 Assistive device utilized: None Level of assistance: Complete Independence Comments: See gait pattern above  FUNCTIONAL TESTS:  5 times sit to stand: TBA (limited due to some residual knee soreness after bowling) Berg Balance Scale: 43/56 Dynamic Gait Index: 18/24  [facility] PT Assessment - 03/02/23 0001       Standardized Balance Assessment   Standardized Balance Assessment Berg Balance Test;Dynamic Gait Index      Berg Balance Test   Sit to Stand Able to stand  independently using hands    Standing Unsupported Able to stand safely 2 minutes    Sitting with Back Unsupported  but Feet Supported on Floor or Stool  Able to sit safely and securely 2 minutes    Stand to Sit Controls descent by using hands    Transfers Able to transfer safely, definite need of hands    Standing Unsupported with Eyes Closed Able to stand 10 seconds safely    Standing Unsupported with Feet Together Able to place feet together independently and stand 1 minute safely    From Standing, Reach Forward with Outstretched Arm Can reach confidently >25 cm (10")    From Standing Position, Pick up Object from Floor Able to pick up shoe safely and easily    From Standing Position, Turn to Look Behind Over each Shoulder Looks behind from both sides and weight shifts well   shifts all weight to left   Turn 360 Degrees Able to turn 360 degrees safely but slowly    Standing Unsupported, Alternately Place Feet on Step/Stool Needs assistance to keep from falling or unable to try    Standing Unsupported, One Foot in Front Able to plae foot ahead of the other independently and hold 30 seconds    Standing on One Leg Tries to lift leg/unable to hold 3 seconds but remains standing independently    Total Score 43    Berg comment: 43/56      Dynamic Gait Index   Level Surface Mild Impairment    Change in Gait Speed Mild Impairment    Gait with Horizontal Head Turns Normal    Gait with Vertical Head Turns Normal    Gait and Pivot Turn Normal    Step Over Obstacle Moderate Impairment    Step Around Obstacles Normal    Steps Moderate Impairment    Total Score 18    DGI comment: 18/24              PATIENT SURVEYS:  N/a  TODAY'S TREATMENT:                                                                                                                              DATE: 03/02/23 See HEP below    PATIENT EDUCATION: Education details: Exam findings, POC, initial HEP Person educated: Patient Education method: Explanation, Demonstration, and Handouts Education comprehension: verbalized understanding, returned demonstration, and needs  further education  HOME EXERCISE PROGRAM: Access Code: [email or web address] URL: [email or web address] Date: 03/02/2023 Prepared by: Vernon Prey April Kirstie Peri  Exercises - Standing Hip Abduction with Counter Support  - 1 x daily - 7 x weekly - 2-3 sets - 10 reps - Standing Hip Flexion with Counter Support  - 1 x daily - 7 x weekly - 2-3 sets - 10 reps - Standing Hip Extension with Counter Support  - 1 x daily - 7 x weekly - 2-3 sets - 10 reps - Standing Heel Raise with Support  - 1 x daily - 7 x weekly - 2-3 sets - 10 reps - Seated Toe Raise  - 1 x  daily - 7 x weekly - 2-3 sets - 10 reps  GOALS: Goals reviewed with patient? Yes  SHORT TERM GOALS: Target date: 03/30/2023  Pt will be ind with initial HEP Baseline: Goal status: INITIAL  2.  Pt will be able to perform 5x STS without UE support to demo improving functional LE strength Baseline:  Goal status: INITIAL  3.  Pt will be able to amb with normal reciprocal gait x 400' for home mobility Baseline:  Goal status: INITIAL    LONG TERM GOALS: Target date: 04/27/2023   Pt will be ind with management and progression of HEP Baseline:  Goal status: INITIAL  2.  Pt will have improved Berg Balance Score to >/=50/56 to demo MCID Baseline:  Goal status: INITIAL  3.  Pt will have increased DGI to >/=20/24 to demo low fall risk Baseline:  Goal status: INITIAL  4.  Pt will be able to safely ascend/descend 20 stairs with reciprocal pattern and one HR for home mobility Baseline:  Goal status: INITIAL    ASSESSMENT:  CLINICAL IMPRESSION: Patient is a 47 y.o. F who was seen today for physical therapy evaluation and treatment for MS and weakness. Pt notes ongoing decline since 2020 with near falls. Assessment significant for gross R LE weakness with resultant decrease in balance and ROM affecting gait and mobility. Pt with R foot drop and is open to use of AFO. Pt is at a high risk of falls based on Berg Balance Test and DGI.  Pt would benefit from PT to address these issues to maximize her level of function.   OBJECTIVE IMPAIRMENTS: Abnormal gait, decreased activity tolerance, decreased balance, decreased coordination, decreased endurance, decreased mobility, difficulty walking, decreased ROM, decreased strength, impaired flexibility, impaired sensation, improper body mechanics, and postural dysfunction.   ACTIVITY LIMITATIONS: standing, squatting, stairs, transfers, and locomotion level  PARTICIPATION LIMITATIONS: driving, community activity, and occupation  PERSONAL FACTORS: Fitness, Past/current experiences, and Time since onset of injury/illness/exacerbation are also affecting patient's functional outcome.   REHAB POTENTIAL: Good  CLINICAL DECISION MAKING: Evolving/moderate complexity  EVALUATION COMPLEXITY: Moderate  PLAN:  PT FREQUENCY: 2x/week  PT DURATION: 8 weeks  PLANNED INTERVENTIONS: Therapeutic exercises, Therapeutic activity, Neuromuscular re-education, Balance training, Gait training, Patient/Family education, Self Care, Joint mobilization, Stair training, Vestibular training, Dry Needling, Electrical stimulation, Cryotherapy, Moist heat, Taping, Vasopneumatic device, Ionotophoresis 4mg /ml Dexamethasone, Manual therapy, and Re-evaluation  PLAN FOR NEXT SESSION: Assess response to HEP. Work on gross R LE strengthening, ROM, stability, balance. Focus primarily on hips and ankles.    Patric Buckhalter April Ma L Marguerite Barba, PT 03/02/2023, 1:22 PM

## 2023-03-03 ENCOUNTER — Encounter: Payer: Self-pay | Admitting: Physical Therapy

## 2023-03-03 ENCOUNTER — Ambulatory Visit: Payer: Federal, State, Local not specified - PPO | Admitting: Physical Therapy

## 2023-03-03 DIAGNOSIS — R2689 Other abnormalities of gait and mobility: Secondary | ICD-10-CM

## 2023-03-03 DIAGNOSIS — M6281 Muscle weakness (generalized): Secondary | ICD-10-CM

## 2023-03-03 DIAGNOSIS — R2681 Unsteadiness on feet: Secondary | ICD-10-CM

## 2023-03-03 NOTE — Therapy (Signed)
OUTPATIENT PHYSICAL THERAPY NEURO TREATMENT   Patient Name: Karen Oconnor MRN: [id number] DOB:27-Mar-1976, 47 y.o., female Today's Date: 03/03/2023   PCP: Harvie Heck, MD REFERRING PROVIDER: Despina Arias, MD  END OF SESSION:  PT End of Session - 03/03/23 1102     Visit Number 2    Number of Visits 16    Date for PT Re-Evaluation 04/27/23    Authorization Type BCBS    PT Start Time 1103    PT Stop Time 1145    PT Time Calculation (min) 42 min    Activity Tolerance Patient tolerated treatment well    Behavior During Therapy WFL for tasks assessed/performed             Past Medical History:  Diagnosis Date   Diabetes (HCC)    MS (multiple sclerosis) (HCC)    Past Surgical History:  Procedure Laterality Date   ABDOMINAL HYSTERECTOMY  2016   Patient Active Problem List   Diagnosis Date Noted   Transverse myelitis (HCC) 02/25/2022   Multiple sclerosis (HCC) 02/25/2022   Right foot drop 02/25/2022   High risk medication use 02/25/2022   Allodynia 02/25/2022    ONSET DATE: 2013  REFERRING DIAG:  G35 (ICD-10-CM) - Multiple sclerosis (HCC)  M21.371 (ICD-10-CM) - Right foot drop  R26.9 (ICD-10-CM) - Gait difficulty    THERAPY DIAG:  Other abnormalities of gait and mobility  Muscle weakness (generalized)  Unsteadiness on feet  Rationale for Evaluation and Treatment: Rehabilitation  SUBJECTIVE:                                                                                                                                                                                             SUBJECTIVE STATEMENT: Pt states she has been able to try the exercises. Did some last night and this morning.   From eval: "The main thing that brings me in is coordination, balance, strength, and flexibility." Pt reports weakness on R side. Pt states she feels that things have declined since COVID 2020 (reports she had stopped going to PT during that time). Getting in/out of a car  is hard, stairs are hard. Pt does have N/T in both feet. Pt states she does not have an AFO. Sometimes has dizziness and near falls. Pt accompanied by: self  PERTINENT HISTORY: Per H&P: was diagnosed with MS in 2013 after presenting with thoracic transverse myelitis and was found to have multiple other lesions consistent with MS and right leg spasticity/foot drop. She presented with electrical sock sensations in her back and legs followed by numbness in the right flank/abdomen and allodynia in her right leg.  She also had a right foot drop and spasticity. She also has numbness in her left foot. She was concerned about stroke and saw Dr. Corwin Levins who did an MRI of the cervical/thoracic spine. She had a large non-enhancing lesion (was 3 months after 1st symptom) at T7-T11. MRI of the brain also showed lesions and MS was diagnosed. 2 NIDDM. She had EBV and Varicella as a child   PAIN:  Are you having pain? No  PRECAUTIONS: Fall  WEIGHT BEARING RESTRICTIONS: No  FALLS: Has patient fallen in last 6 months? 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, contracting 3x/wk   PATIENT GOALS:   OBJECTIVE:   DIAGNOSTIC FINDINGS: Nothing in 2024  SENSATION: Symmetrical lateral calf into bottom of feet  COORDINATION: WFL  EDEMA:  None noted  MUSCLE TONE: +1 spasticity with R ankle DF  MUSCLE LENGTH: Hamstrings: Right 70 deg; Left 70 deg Thomas test: did not assess  DTRs:  Did not assess  POSTURE: weight shift left and R LE abducted, foot in pronation/ER  LOWER EXTREMITY ROM:     Active  Right Eval Left Eval  Hip flexion 95 115  Hip extension    Hip abduction    Hip adduction    Hip internal rotation    Hip external rotation    Knee flexion     Knee extension    Ankle dorsiflexion 5 15  Ankle plantarflexion    Ankle inversion    Ankle eversion     (Blank rows = not tested)  LOWER EXTREMITY MMT:    MMT Right Eval Left Eval  Hip flexion 3- 5  Hip extension 3+ 3-  Hip abduction 3+ 3  Hip adduction    Hip internal rotation 3 5  Hip external rotation 3 3+  Knee flexion 3+ 5  Knee extension 4 5  Ankle dorsiflexion    Ankle plantarflexion    Ankle inversion    Ankle eversion    (Blank rows = not tested)  BED MOBILITY:  Independent  TRANSFERS: Assistive device utilized: None  Sit to stand: Modified independence Stand to sit: Modified independence Chair to chair: Modified independence Floor:  needs assessment  STAIRS: Level of Assistance: Modified independence Stair Negotiation Technique: Step to Pattern Alternating Pattern  with Bilateral Rails Number of Stairs: 10  Height of Stairs: 4-6"  Comments: Step to pattern on ascent; reciprocal pattern on descent. Needs use of bilat UE support  GAIT: Gait pattern: step through pattern, decreased stance time- Right, decreased hip/knee flexion- Right, decreased ankle dorsiflexion- Right, circumduction- Right, lateral hip instability, lateral lean- Right, and lateral lean- Left; R foot externally rotated Distance walked: 75'x4 Assistive device utilized: None Level of assistance: Complete Independence Comments: See gait pattern above  FUNCTIONAL TESTS:  5 times sit to stand: TBA (limited due to some residual knee soreness after bowling) Berg Balance Scale: 43/56 Dynamic Gait Index: 18/24     PATIENT SURVEYS:  N/a  TODAY'S TREATMENT:       [facility] Adult PT Treatment:  DATE: 03/03/23 Therapeutic Exercise: Nustep L6 x 5 min Seated rockerboard ankle DF/PF 3x10 Seated piriformis stretch x30 sec S/L hip flexion 2x10 S/L hip adduction 2x10 S/L reverse clamshell 2x10 Supine hip abduction 2x10 Heel raises 2x10 Hip 3 way  slider 2x10 Left leg press on bosu ball 2x10x5 sec Neuromuscular re-ed: Forward step tap 4" 2x10 L foot on 4" step balance 2x30 sec                                                                                                                           DATE: 03/02/23 See HEP below    PATIENT EDUCATION: Education details: Exam findings, POC, initial HEP Person educated: Patient Education method: Explanation, Demonstration, and Handouts Education comprehension: verbalized understanding, returned demonstration, and needs further education  HOME EXERCISE PROGRAM: Access Code: [email or web address] URL: [email or web address] Date: 03/02/2023 Prepared by: Vernon Prey April Kirstie Peri  Exercises - Standing Hip Abduction with Counter Support  - 1 x daily - 7 x weekly - 2-3 sets - 10 reps - Standing Hip Flexion with Counter Support  - 1 x daily - 7 x weekly - 2-3 sets - 10 reps - Standing Hip Extension with Counter Support  - 1 x daily - 7 x weekly - 2-3 sets - 10 reps - Standing Heel Raise with Support  - 1 x daily - 7 x weekly - 2-3 sets - 10 reps - Seated Toe Raise  - 1 x daily - 7 x weekly - 2-3 sets - 10 reps  GOALS: Goals reviewed with patient? Yes  SHORT TERM GOALS: Target date: 03/30/2023  Pt will be ind with initial HEP Baseline: Goal status: INITIAL  2.  Pt will be able to perform 5x STS without UE support to demo improving functional LE strength Baseline:  Goal status: INITIAL  3.  Pt will be able to amb with normal reciprocal gait x 400' for home mobility Baseline:  Goal status: INITIAL    LONG TERM GOALS: Target date: 04/27/2023   Pt will be ind with management and progression of HEP Baseline:  Goal status: INITIAL  2.  Pt will have improved Berg Balance Score to >/=50/56 to demo MCID Baseline:  Goal status: INITIAL  3.  Pt will have increased DGI to >/=20/24 to demo low fall risk Baseline:  Goal status: INITIAL  4.  Pt will be able to safely  ascend/descend 20 stairs with reciprocal pattern and one HR for home mobility Baseline:  Goal status: INITIAL    ASSESSMENT:  CLINICAL IMPRESSION: Treatment focused on LE strengthening and stability. Initiated balance training this session. Will continue to progress pt as able.   From eval: Patient is a 47 y.o. F who was seen today for physical therapy evaluation and treatment for MS and weakness. Pt notes ongoing decline since 2020 with near falls. Assessment significant for gross R LE weakness with resultant decrease in balance and ROM affecting gait and mobility. Pt with R foot drop and  is open to use of AFO. Pt is at a high risk of falls based on Berg Balance Test and DGI. Pt would benefit from PT to address these issues to maximize her level of function.   OBJECTIVE IMPAIRMENTS: Abnormal gait, decreased activity tolerance, decreased balance, decreased coordination, decreased endurance, decreased mobility, difficulty walking, decreased ROM, decreased strength, impaired flexibility, impaired sensation, improper body mechanics, and postural dysfunction.    PLAN:  PT FREQUENCY: 2x/week  PT DURATION: 8 weeks  PLANNED INTERVENTIONS: Therapeutic exercises, Therapeutic activity, Neuromuscular re-education, Balance training, Gait training, Patient/Family education, Self Care, Joint mobilization, Stair training, Vestibular training, Dry Needling, Electrical stimulation, Cryotherapy, Moist heat, Taping, Vasopneumatic device, Ionotophoresis 4mg /ml Dexamethasone, Manual therapy, and Re-evaluation  PLAN FOR NEXT SESSION: Assess response to HEP. Work on gross R LE strengthening, ROM, stability, balance. Focus primarily on hips and ankles.    Eknoor [place]ack April Ma L Anastasios Melander, PT 03/03/2023, 1:00 PM

## 2023-03-04 ENCOUNTER — Ambulatory Visit: Payer: Federal, State, Local not specified - PPO

## 2023-03-09 ENCOUNTER — Encounter: Payer: Self-pay | Admitting: Physical Therapy

## 2023-03-09 ENCOUNTER — Ambulatory Visit: Payer: Federal, State, Local not specified - PPO | Admitting: Physical Therapy

## 2023-03-09 DIAGNOSIS — R2689 Other abnormalities of gait and mobility: Secondary | ICD-10-CM

## 2023-03-09 DIAGNOSIS — M6281 Muscle weakness (generalized): Secondary | ICD-10-CM

## 2023-03-09 DIAGNOSIS — R2681 Unsteadiness on feet: Secondary | ICD-10-CM

## 2023-03-09 NOTE — Therapy (Signed)
OUTPATIENT PHYSICAL THERAPY NEURO TREATMENT   Patient Name: Karen Oconnor MRN: [id number] DOB:September 04, 1976, 47 y.o., female Today's Date: 03/09/2023  PCP: Harvie Heck, MD REFERRING PROVIDER: Despina Arias, MD  END OF SESSION:  PT End of Session - 03/09/23 0759     Visit Number 3    Number of Visits 16    Date for PT Re-Evaluation 04/27/23    Authorization Type BCBS    PT Start Time 0800    PT Stop Time 0840    PT Time Calculation (min) 40 min    Activity Tolerance Patient tolerated treatment well    Behavior During Therapy [facility] for tasks assessed/performed              Past Medical History:  Diagnosis Date   Diabetes (HCC)    MS (multiple sclerosis) (HCC)    Past Surgical History:  Procedure Laterality Date   ABDOMINAL HYSTERECTOMY  2016   Patient Active Problem List   Diagnosis Date Noted   Transverse myelitis (HCC) 02/25/2022   Multiple sclerosis (HCC) 02/25/2022   Right foot drop 02/25/2022   High risk medication use 02/25/2022   Allodynia 02/25/2022    ONSET DATE: 2013  REFERRING DIAG:  G35 (ICD-10-CM) - Multiple sclerosis (HCC)  M21.371 (ICD-10-CM) - Right foot drop  R26.9 (ICD-10-CM) - Gait difficulty    THERAPY DIAG:  Other abnormalities of gait and mobility  Muscle weakness (generalized)  Unsteadiness on feet  Rationale for Evaluation and Treatment: Rehabilitation  SUBJECTIVE:                                                                                                                                                                                             SUBJECTIVE STATEMENT: Pt reports gabapentin hasn't kicked in yet.    From eval: Pt reports weakness on R side. Pt states she feels that things have declined since COVID 2020 (reports she had stopped going to PT during that time). Getting in/out of a car is hard, stairs are hard. Pt does have N/T in both feet. Pt states she does not have an AFO. Sometimes has dizziness and near  falls. Pt accompanied by: self  PERTINENT HISTORY: Per H&P: was diagnosed with MS in 2013 after presenting with thoracic transverse myelitis and was found to have multiple other lesions consistent with MS and right leg spasticity/foot drop. She presented with electrical sock sensations in her back and legs followed by numbness in the right flank/abdomen and allodynia in her right leg. She also had a right foot drop and spasticity. She also has numbness in her left foot. She was concerned about stroke  and saw Dr. Corwin Levins who did an MRI of the cervical/thoracic spine. She had a large non-enhancing lesion (was 3 months after 1st symptom) at T7-T11. MRI of the brain also showed lesions and MS was diagnosed. 2 NIDDM. She had EBV and Varicella as a child   PAIN:  Are you having pain? No  PRECAUTIONS: Fall  WEIGHT BEARING RESTRICTIONS: No  FALLS: Has patient fallen in last 6 months? 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, contracting 3x/wk   PATIENT GOALS: "The main thing that brings me in is coordination, balance, strength, and flexibility."   OBJECTIVE: (Measures in this section from initial evaluation unless otherwise noted)  DIAGNOSTIC FINDINGS: Nothing in 2024  SENSATION: Bilat lateral calf into bottom of feet  MUSCLE TONE: +1 spasticity with R ankle DF  MUSCLE LENGTH: Hamstrings: Right 70 deg; Left 70 deg Thomas test: did not assess  POSTURE: weight shift left and R LE abducted, foot in pronation/ER  LOWER EXTREMITY ROM:     Active  Right Eval Left Eval  Hip flexion 95 115  Hip extension    Hip abduction    Hip adduction    Hip internal rotation    Hip external rotation    Knee flexion    Knee extension    Ankle dorsiflexion 5 15   Ankle plantarflexion    Ankle inversion    Ankle eversion     (Blank rows = not tested)  LOWER EXTREMITY MMT:    MMT Right Eval Left Eval  Hip flexion 3- 5  Hip extension 3+ 3-  Hip abduction 3+ 3  Hip adduction    Hip internal rotation 3 5  Hip external rotation 3 3+  Knee flexion 3+ 5  Knee extension 4 5  Ankle dorsiflexion    Ankle plantarflexion    Ankle inversion    Ankle eversion    (Blank rows = not tested)  TRANSFERS: Floor:  needs assessment  STAIRS: Level of Assistance: Modified independence Stair Negotiation Technique: Step to Pattern Alternating Pattern  with Bilateral Rails Number of Stairs: 10  Height of Stairs: 4-6"  Comments: Step to pattern on ascent; reciprocal pattern on descent. Needs use of bilat UE support  GAIT: Gait pattern: step through pattern, decreased stance time- Right, decreased hip/knee flexion- Right, decreased ankle dorsiflexion- Right, circumduction- Right, lateral hip instability, lateral lean- Right, and lateral lean- Left; R foot externally rotated Distance walked: 75'x4 Assistive device utilized: None Level of assistance: Complete Independence Comments: See gait pattern above  FUNCTIONAL TESTS:  5 times sit to stand: TBA (limited due to some residual knee soreness after bowling) Berg Balance Scale: 43/56 Dynamic Gait Index: 18/24   PATIENT SURVEYS:  N/a  TODAY'S TREATMENT:       [facility] Adult PT Treatment:                                                DATE: 03/09/23 Therapeutic Exercise: Nustep L6 x 5 min Seated rockerboard ankle DF/PF 3x10 Seated piriformis stretch x30 sec Seated hamstring stretch x30 sec  Supine PPT with marching x10 Supine hip flexion with perturbations at end rangex5 Supine  hip flexion + abd x10 Supine hip abd with red WOD band 3x10 Seated ankle pf/df on rockerboard 2x10 Seated ankle DF eccentrics x10 Standing hip 3 way slider 2x10 Fwd tap on 4" step 2x10   [facility] Adult PT Treatment:                                                 DATE: 03/03/23 Therapeutic Exercise: Nustep L6 x 5 min Seated rockerboard ankle DF/PF 3x10 Seated piriformis stretch x30 sec S/L hip flexion 2x10 S/L hip adduction 2x10 S/L reverse clamshell 2x10 Supine hip abduction 2x10 Heel raises 2x10 Hip 3 way slider 2x10 Left leg press on bosu ball 2x10x5 sec Neuromuscular re-ed: Forward step tap 4" 2x10 L foot on 4" step balance 2x30 sec                                                                                                                           DATE: 03/02/23 See HEP below    PATIENT EDUCATION: Education details: Exam findings, POC, initial HEP Person educated: Patient Education method: Explanation, Demonstration, and Handouts Education comprehension: verbalized understanding, returned demonstration, and needs further education  HOME EXERCISE PROGRAM: Access Code: [email or web address] URL: [email or web address] Date: 03/02/2023 Prepared by: Vernon Prey April Kirstie Peri  Exercises - Standing Hip Abduction with Counter Support  - 1 x daily - 7 x weekly - 2-3 sets - 10 reps - Standing Hip Flexion with Counter Support  - 1 x daily - 7 x weekly - 2-3 sets - 10 reps - Standing Hip Extension with Counter Support  - 1 x daily - 7 x weekly - 2-3 sets - 10 reps - Standing Heel Raise with Support  - 1 x daily - 7 x weekly - 2-3 sets - 10 reps - Seated Toe Raise  - 1 x daily - 7 x weekly - 2-3 sets - 10 reps  GOALS: Goals reviewed with patient? Yes  SHORT TERM GOALS: Target date: 03/30/2023  Pt will be ind with initial HEP Baseline: Goal status: INITIAL  2.  Pt will be able to perform 5x STS without UE support to demo improving functional LE strength Baseline:  Goal status: INITIAL  3.  Pt will be able to amb with normal reciprocal gait x 400' for home mobility Baseline:  Goal status: INITIAL    LONG TERM GOALS: Target date: 04/27/2023   Pt will be ind with management and progression  of HEP Baseline:  Goal status: INITIAL  2.  Pt will have improved Berg Balance Score to >/=50/56 to demo MCID Baseline:  Goal status: INITIAL  3.  Pt will have increased DGI to >/=20/24 to demo low fall risk Baseline:  Goal status: INITIAL  4.  Pt will be able to safely ascend/descend 20 stairs with reciprocal pattern and one HR for home  mobility Baseline:  Goal status: INITIAL    ASSESSMENT:  CLINICAL IMPRESSION: Continued to work on improving R LE weight bearing/stability. Pt tolerated addition of resistance bands for hip abductor strengthening this session.   From eval: Patient is a 47 y.o. F who was seen today for physical therapy evaluation and treatment for MS and weakness. Pt notes ongoing decline since 2020 with near falls. Assessment significant for gross R LE weakness with resultant decrease in balance and ROM affecting gait and mobility. Pt with R foot drop and is open to use of AFO. Pt is at a high risk of falls based on Berg Balance Test and DGI. Pt would benefit from PT to address these issues to maximize her level of function.   OBJECTIVE IMPAIRMENTS: Abnormal gait, decreased activity tolerance, decreased balance, decreased coordination, decreased endurance, decreased mobility, difficulty walking, decreased ROM, decreased strength, impaired flexibility, impaired sensation, improper body mechanics, and postural dysfunction.    PLAN:  PT FREQUENCY: 2x/week  PT DURATION: 8 weeks  PLANNED INTERVENTIONS: Therapeutic exercises, Therapeutic activity, Neuromuscular re-education, Balance training, Gait training, Patient/Family education, Self Care, Joint mobilization, Stair training, Vestibular training, Dry Needling, Electrical stimulation, Cryotherapy, Moist heat, Taping, Vasopneumatic device, Ionotophoresis 4mg /ml Dexamethasone, Manual therapy, and Re-evaluation  PLAN FOR NEXT SESSION: Assess response to HEP. Work on gross R LE strengthening, ROM, stability, balance.  Focus primarily on hips and ankles.    Gaytha Raybourn April Ma L Adonus Uselman, PT 03/09/2023, 7:59 AM

## 2023-03-11 ENCOUNTER — Ambulatory Visit: Payer: Federal, State, Local not specified - PPO

## 2023-03-11 DIAGNOSIS — R2689 Other abnormalities of gait and mobility: Secondary | ICD-10-CM

## 2023-03-11 DIAGNOSIS — M6281 Muscle weakness (generalized): Secondary | ICD-10-CM

## 2023-03-11 DIAGNOSIS — R2681 Unsteadiness on feet: Secondary | ICD-10-CM

## 2023-03-11 NOTE — Therapy (Signed)
OUTPATIENT PHYSICAL THERAPY NEURO TREATMENT   Patient Name: Brixley Dioguardi MRN: [id number] DOB:Oct 25, 1976, 47 y.o., female Today's Date: 03/11/2023  PCP: Harvie Heck, MD REFERRING PROVIDER: Despina Arias, MD  END OF SESSION:  PT End of Session - 03/11/23 1054     Visit Number 4    Number of Visits 16    Date for PT Re-Evaluation 04/27/23    Authorization Type BCBS    PT Start Time 1100    PT Stop Time 1140    PT Time Calculation (min) 40 min    Activity Tolerance Patient tolerated treatment well    Behavior During Therapy [facility] for tasks assessed/performed              Past Medical History:  Diagnosis Date   Diabetes (HCC)    MS (multiple sclerosis) (HCC)    Past Surgical History:  Procedure Laterality Date   ABDOMINAL HYSTERECTOMY  2016   Patient Active Problem List   Diagnosis Date Noted   Transverse myelitis (HCC) 02/25/2022   Multiple sclerosis (HCC) 02/25/2022   Right foot drop 02/25/2022   High risk medication use 02/25/2022   Allodynia 02/25/2022    ONSET DATE: 2013  REFERRING DIAG:  G35 (ICD-10-CM) - Multiple sclerosis (HCC)  M21.371 (ICD-10-CM) - Right foot drop  R26.9 (ICD-10-CM) - Gait difficulty    THERAPY DIAG:  Other abnormalities of gait and mobility  Muscle weakness (generalized)  Unsteadiness on feet  Rationale for Evaluation and Treatment: Rehabilitation  SUBJECTIVE:                                                                                                                                                                                             SUBJECTIVE STATEMENT: Patient reports gabapentin kicked as of yesterday.    From eval: Pt reports weakness on R side. Pt states she feels that things have declined since COVID 2020 (reports she had stopped going to PT during that time). Getting in/out of a car is hard, stairs are hard. Pt does have N/T in both feet. Pt states she does not have an AFO. Sometimes has dizziness and  near falls. Pt accompanied by: self  PERTINENT HISTORY: Per H&P: was diagnosed with MS in 2013 after presenting with thoracic transverse myelitis and was found to have multiple other lesions consistent with MS and right leg spasticity/foot drop. She presented with electrical sock sensations in her back and legs followed by numbness in the right flank/abdomen and allodynia in her right leg. She also had a right foot drop and spasticity. She also has numbness in her left foot. She was concerned about stroke  and saw Dr. Corwin Levins who did an MRI of the cervical/thoracic spine. She had a large non-enhancing lesion (was 3 months after 1st symptom) at T7-T11. MRI of the brain also showed lesions and MS was diagnosed. 2 NIDDM. She had EBV and Varicella as a child   PAIN:  Are you having pain? No  PRECAUTIONS: Fall  WEIGHT BEARING RESTRICTIONS: No  FALLS: Has patient fallen in last 6 months? 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, contracting 3x/wk   PATIENT GOALS: "The main thing that brings me in is coordination, balance, strength, and flexibility."   OBJECTIVE: (Measures in this section from initial evaluation unless otherwise noted)  DIAGNOSTIC FINDINGS: Nothing in 2024  SENSATION: Bilat lateral calf into bottom of feet  MUSCLE TONE: +1 spasticity with R ankle DF  MUSCLE LENGTH: Hamstrings: Right 70 deg; Left 70 deg Thomas test: did not assess  POSTURE: weight shift left and R LE abducted, foot in pronation/ER  LOWER EXTREMITY ROM:     Active  Right Eval Left Eval  Hip flexion 95 115  Hip extension    Hip abduction    Hip adduction    Hip internal rotation    Hip external rotation    Knee flexion    Knee extension    Ankle dorsiflexion  5 15  Ankle plantarflexion    Ankle inversion    Ankle eversion     (Blank rows = not tested)  LOWER EXTREMITY MMT:    MMT Right Eval Left Eval  Hip flexion 3- 5  Hip extension 3+ 3-  Hip abduction 3+ 3  Hip adduction    Hip internal rotation 3 5  Hip external rotation 3 3+  Knee flexion 3+ 5  Knee extension 4 5  Ankle dorsiflexion    Ankle plantarflexion    Ankle inversion    Ankle eversion    (Blank rows = not tested)  TRANSFERS: Floor:  needs assessment  STAIRS: Level of Assistance: Modified independence Stair Negotiation Technique: Step to Pattern Alternating Pattern  with Bilateral Rails Number of Stairs: 10  Height of Stairs: 4-6"  Comments: Step to pattern on ascent; reciprocal pattern on descent. Needs use of bilat UE support  GAIT: Gait pattern: step through pattern, decreased stance time- Right, decreased hip/knee flexion- Right, decreased ankle dorsiflexion- Right, circumduction- Right, lateral hip instability, lateral lean- Right, and lateral lean- Left; R foot externally rotated Distance walked: 75'x4 Assistive device utilized: None Level of assistance: Complete Independence Comments: See gait pattern above  FUNCTIONAL TESTS:  5 times sit to stand: TBA (limited due to some residual knee soreness after bowling) Berg Balance Scale: 43/56 Dynamic Gait Index: 18/24   PATIENT SURVEYS:  N/a  TODAY'S TREATMENT:    [facility] Adult PT Treatment:                                                DATE: 03/11/2023 Therapeutic Exercise: NuStep L6 x Seated: Rocker board ankle DF/PF 3x10 DF stretch slant board 2x30" Supine: Black super band: R knee flex/ext & straight leg raises: pt holding band --> cross body therapist holding band  Passive R  piriformis stretch Marching in PPT 2x10 LTR  Side lying: Straight leg hip abd Front back leg raises (rainbows) Hip abduction leg raises in extension Standing hip 3 way slider --> GTB at lateral ankle        [facility]  Adult PT Treatment:                                                DATE: 03/09/23 Therapeutic Exercise: Nustep L6 x 5 min Seated rockerboard ankle DF/PF 3x10 Seated piriformis stretch x30 sec Seated hamstring stretch x30 sec  Supine PPT with marching x10 Supine hip flexion with perturbations at end rangex5 Supine hip flexion + abd x10 Supine hip abd with red WOD band 3x10 Seated ankle pf/df on rockerboard 2x10 Seated ankle DF eccentrics x10 Standing hip 3 way slider 2x10 Fwd tap on 4" step 2x10   [facility] Adult PT Treatment:                                                DATE: 03/03/23 Therapeutic Exercise: Nustep L6 x 5 min Seated rockerboard ankle DF/PF 3x10 Seated piriformis stretch x30 sec S/L hip flexion 2x10 S/L hip adduction 2x10 S/L reverse clamshell 2x10 Supine hip abduction 2x10 Heel raises 2x10 Hip 3 way slider 2x10 Left leg press on bosu ball 2x10x5 sec Neuromuscular re-ed: Forward step tap 4" 2x10 L foot on 4" step balance 2x30 sec                                                                                                                          PATIENT EDUCATION: Education details: Exam findings, POC, initial HEP Person educated: Patient Education method: Explanation, Demonstration, and Handouts Education comprehension: verbalized understanding, returned demonstration, and needs further education  HOME EXERCISE PROGRAM: Access Code: [email or web address] URL: [email or web address] Date: 03/02/2023 Prepared by: Vernon Prey April Kirstie Peri  Exercises - Standing Hip Abduction with Counter Support  - 1 x daily - 7 x weekly - 2-3 sets - 10 reps - Standing Hip Flexion with Counter Support  - 1 x daily - 7 x weekly - 2-3 sets - 10 reps - Standing Hip Extension with Counter Support  - 1 x daily - 7 x weekly - 2-3 sets - 10 reps - Standing Heel Raise with Support  - 1 x daily - 7 x weekly - 2-3 sets - 10 reps - Seated Toe Raise  - 1 x daily - 7 x weekly - 2-3 sets  - 10 reps  GOALS: Goals reviewed with patient? Yes  SHORT TERM GOALS: Target date: 03/30/2023  Pt will be ind with initial HEP Baseline: Goal status: INITIAL  2.  Pt will be able to perform 5x STS  without UE support to demo improving functional LE strength Baseline:  Goal status: INITIAL  3.  Pt will be able to amb with normal reciprocal gait x 400' for home mobility Baseline:  Goal status: INITIAL    LONG TERM GOALS: Target date: 04/27/2023  Pt will be ind with management and progression of HEP Baseline:  Goal status: INITIAL  2.  Pt will have improved Berg Balance Score to >/=50/56 to demo MCID Baseline:  Goal status: INITIAL  3.  Pt will have increased DGI to >/=20/24 to demo low fall risk Baseline:  Goal status: INITIAL  4.  Pt will be able to safely ascend/descend 20 stairs with reciprocal pattern and one HR for home mobility Baseline:  Goal status: INITIAL    ASSESSMENT:  CLINICAL IMPRESSION:  Hip strengthening progressed with hip abd variations in side lying; tactile cues improved glute activation with hip extension le raises with abduction. Resistance band placed at lateral ankle improved ankle stability and proprioceptive awareness of alignment.   From eval: Patient is a 47 y.o. F who was seen today for physical therapy evaluation and treatment for MS and weakness. Pt notes ongoing decline since 2020 with near falls. Assessment significant for gross R LE weakness with resultant decrease in balance and ROM affecting gait and mobility. Pt with R foot drop and is open to use of AFO. Pt is at a high risk of falls based on Berg Balance Test and DGI. Pt would benefit from PT to address these issues to maximize her level of function.   OBJECTIVE IMPAIRMENTS: Abnormal gait, decreased activity tolerance, decreased balance, decreased coordination, decreased endurance, decreased mobility, difficulty walking, decreased ROM, decreased strength, impaired flexibility, impaired  sensation, improper body mechanics, and postural dysfunction.    PLAN:  PT FREQUENCY: 2x/week  PT DURATION: 8 weeks  PLANNED INTERVENTIONS: Therapeutic exercises, Therapeutic activity, Neuromuscular re-education, Balance training, Gait training, Patient/Family education, Self Care, Joint mobilization, Stair training, Vestibular training, Dry Needling, Electrical stimulation, Cryotherapy, Moist heat, Taping, Vasopneumatic device, Ionotophoresis 4mg /ml Dexamethasone, Manual therapy, and Re-evaluation  PLAN FOR NEXT SESSION: Work on gross R LE strengthening, ROM, stability, balance. Focus primarily on hips and ankles.    Sanjuana Mae, PTA 03/11/2023, 11:41 AM

## 2023-03-16 ENCOUNTER — Ambulatory Visit: Payer: Federal, State, Local not specified - PPO

## 2023-03-16 DIAGNOSIS — M6281 Muscle weakness (generalized): Secondary | ICD-10-CM

## 2023-03-16 DIAGNOSIS — R2689 Other abnormalities of gait and mobility: Secondary | ICD-10-CM | POA: Diagnosis not present

## 2023-03-16 DIAGNOSIS — R2681 Unsteadiness on feet: Secondary | ICD-10-CM

## 2023-03-16 NOTE — Therapy (Signed)
OUTPATIENT PHYSICAL THERAPY NEURO TREATMENT   Patient Name: Karen Oconnor MRN: [id number] DOB:[birth year], 47 y.o., female Today's Date: 03/16/2023  PCP: Harvie Heck, MD REFERRING PROVIDER: Despina Arias, MD  END OF SESSION:  PT End of Session - 03/16/23 0933     Visit Number 5    Number of Visits 16    Date for PT Re-Evaluation 04/27/23    Authorization Type BCBS    PT Start Time 0933    PT Stop Time 1015    PT Time Calculation (min) 42 min    Activity Tolerance Patient tolerated treatment well    Behavior During Therapy [facility] for tasks assessed/performed              Past Medical History:  Diagnosis Date   Diabetes (HCC)    MS (multiple sclerosis) (HCC)    Past Surgical History:  Procedure Laterality Date   ABDOMINAL HYSTERECTOMY  2016   Patient Active Problem List   Diagnosis Date Noted   Transverse myelitis (HCC) 02/25/2022   Multiple sclerosis (HCC) 02/25/2022   Right foot drop 02/25/2022   High risk medication use 02/25/2022   Allodynia 02/25/2022    ONSET DATE: 2013  REFERRING DIAG:  G35 (ICD-10-CM) - Multiple sclerosis (HCC)  M21.371 (ICD-10-CM) - Right foot drop  R26.9 (ICD-10-CM) - Gait difficulty    THERAPY DIAG:  Other abnormalities of gait and mobility  Muscle weakness (generalized)  Unsteadiness on feet  Rationale for Evaluation and Treatment: Rehabilitation  SUBJECTIVE:                                                                                                                                                                                             SUBJECTIVE STATEMENT: Patient reports she went dancing over the weekend and her L knee bothered her from sitting in lawn chairs. Patient states she will ask neurologist about AFO.  From eval: Pt reports weakness on R side. Pt states she feels that things have declined since COVID 2020 (reports she had stopped going to PT during that time). Getting in/out of a car is hard, stairs  are hard. Pt does have N/T in both feet. Pt states she does not have an AFO. Sometimes has dizziness and near falls. Pt accompanied by: self  PERTINENT HISTORY: Per H&P: was diagnosed with MS in 2013 after presenting with thoracic transverse myelitis and was found to have multiple other lesions consistent with MS and right leg spasticity/foot drop. She presented with electrical sock sensations in her back and legs followed by numbness in the right flank/abdomen and allodynia in her right leg. She also had a  right foot drop and spasticity. She also has numbness in her left foot. She was concerned about stroke and saw Dr. Corwin Levins who did an MRI of the cervical/thoracic spine. She had a large non-enhancing lesion (was 3 months after 1st symptom) at T7-T11. MRI of the brain also showed lesions and MS was diagnosed. 2 NIDDM. She had EBV and Varicella as a child   PAIN:  Are you having pain? No  PRECAUTIONS: Fall  WEIGHT BEARING RESTRICTIONS: No  FALLS: Has patient fallen in last 6 months? 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, contracting 3x/wk   PATIENT GOALS: "The main thing that brings me in is coordination, balance, strength, and flexibility."   OBJECTIVE: (Measures in this section from initial evaluation unless otherwise noted)  DIAGNOSTIC FINDINGS: Nothing in 2024  SENSATION: Bilat lateral calf into bottom of feet  MUSCLE TONE: +1 spasticity with R ankle DF  MUSCLE LENGTH: Hamstrings: Right 70 deg; Left 70 deg Thomas test: did not assess  POSTURE: weight shift left and R LE abducted, foot in pronation/ER  LOWER EXTREMITY ROM:     Active  Right Eval Left Eval  Hip flexion 95 115  Hip extension    Hip abduction    Hip adduction     Hip internal rotation    Hip external rotation    Knee flexion    Knee extension    Ankle dorsiflexion 5 15  Ankle plantarflexion    Ankle inversion    Ankle eversion     (Blank rows = not tested)  LOWER EXTREMITY MMT:    MMT Right Eval Left Eval  Hip flexion 3- 5  Hip extension 3+ 3-  Hip abduction 3+ 3  Hip adduction    Hip internal rotation 3 5  Hip external rotation 3 3+  Knee flexion 3+ 5  Knee extension 4 5  Ankle dorsiflexion    Ankle plantarflexion    Ankle inversion    Ankle eversion    (Blank rows = not tested)  TRANSFERS: Floor:  needs assessment  STAIRS: Level of Assistance: Modified independence Stair Negotiation Technique: Step to Pattern Alternating Pattern  with Bilateral Rails Number of Stairs: 10  Height of Stairs: 4-6"  Comments: Step to pattern on ascent; reciprocal pattern on descent. Needs use of bilat UE support  GAIT: Gait pattern: step through pattern, decreased stance time- Right, decreased hip/knee flexion- Right, decreased ankle dorsiflexion- Right, circumduction- Right, lateral hip instability, lateral lean- Right, and lateral lean- Left; R foot externally rotated Distance walked: 75'x4 Assistive device utilized: None Level of assistance: Complete Independence Comments: See gait pattern above  FUNCTIONAL TESTS:  5 times sit to stand: TBA (limited due to some residual knee soreness after bowling) Berg Balance Scale: 43/56 Dynamic Gait Index: 18/24   PATIENT SURVEYS:  N/a  TODAY'S TREATMENT:     [facility] Adult PT Treatment:                                                DATE: 03/16/2023 Therapeutic Exercise: NuStep L6 x 5 min Mat Table: Seated knee ball squeeze + TA activation 10x5" Seated hip abd isometric (  gait belt) 10x5" Rocker board ankle DF/PF 3x10 LTR --> wide leg wipers Marching in PPT x 10 --> R leg marching Standing hip abd --> RTB stabilizing at R ankle Seated 1/2 long sit:  ankle eversion (therapist stabilizing  lower leg) DF/calf stretch w/strap Seated hip IR/ER AROM with slider STS --> L foot propped on yoga block    [facility] Adult PT Treatment:                                                DATE: 03/11/2023 Therapeutic Exercise: NuStep L6 x Seated: Rocker board ankle DF/PF 3x10 DF stretch slant board 2x30" Supine: Black super band: R knee flex/ext & straight leg raises: pt holding band --> cross body therapist holding band  Passive R piriformis stretch Marching in PPT 2x10 LTR  Side lying: Straight leg hip abd Front back leg raises (rainbows) Hip abduction leg raises in extension Standing hip 3 way slider --> GTB at lateral ankle        [facility] Adult PT Treatment:                                                DATE: 03/09/23 Therapeutic Exercise: Nustep L6 x 5 min Seated rockerboard ankle DF/PF 3x10 Seated piriformis stretch x30 sec Seated hamstring stretch x30 sec  Supine PPT with marching x10 Supine hip flexion with perturbations at end rangex5 Supine hip flexion + abd x10 Supine hip abd with red WOD band 3x10 Seated ankle pf/df on rockerboard 2x10 Seated ankle DF eccentrics x10 Standing hip 3 way slider 2x10 Fwd tap on 4" step 2x10                                                                                                                          PATIENT EDUCATION: Education details: Exam findings, POC, initial HEP Person educated: Patient Education method: Explanation, Demonstration, and Handouts Education comprehension: verbalized understanding, returned demonstration, and needs further education  HOME EXERCISE PROGRAM: Access Code: [email or web address] URL: [email or web address] Date: 03/02/2023 Prepared by: Vernon Prey April Kirstie Peri  Exercises - Standing Hip Abduction with Counter Support  - 1 x daily - 7 x weekly - 2-3 sets - 10 reps - Standing Hip Flexion with Counter Support  - 1 x daily - 7 x weekly - 2-3 sets - 10 reps - Standing Hip Extension with  Counter Support  - 1 x daily - 7 x weekly - 2-3 sets - 10 reps - Standing Heel Raise with Support  - 1 x daily - 7 x weekly - 2-3 sets - 10 reps - Seated Toe Raise  - 1 x daily - 7 x weekly - 2-3 sets - 10 reps  GOALS: Goals reviewed with patient? Yes  SHORT TERM GOALS: Target date: 03/30/2023  Pt will be ind with initial HEP Baseline: Goal status: INITIAL  2.  Pt will be able to perform 5x STS without UE support to demo improving functional LE strength Baseline:  Goal status: INITIAL  3.  Pt will be able to amb with normal reciprocal gait x 400' for home mobility Baseline:  Goal status: INITIAL    LONG TERM GOALS: Target date: 04/27/2023  Pt will be ind with management and progression of HEP Baseline:  Goal status: INITIAL  2.  Pt will have improved Berg Balance Score to >/=50/56 to demo MCID Baseline:  Goal status: INITIAL  3.  Pt will have increased DGI to >/=20/24 to demo low fall risk Baseline:  Goal status: INITIAL  4.  Pt will be able to safely ascend/descend 20 stairs with reciprocal pattern and one HR for home mobility Baseline:  Goal status: INITIAL    ASSESSMENT:  CLINICAL IMPRESSION:  Hip strengthening and mobility progressed with IR/ER AROM. Left propped during sit to stands to promote increased weight bearing on R LE.   From eval: Patient is a 47 y.o. F who was seen today for physical therapy evaluation and treatment for MS and weakness. Pt notes ongoing decline since 2020 with near falls. Assessment significant for gross R LE weakness with resultant decrease in balance and ROM affecting gait and mobility. Pt with R foot drop and is open to use of AFO. Pt is at a high risk of falls based on Berg Balance Test and DGI. Pt would benefit from PT to address these issues to maximize her level of function.   OBJECTIVE IMPAIRMENTS: Abnormal gait, decreased activity tolerance, decreased balance, decreased coordination, decreased endurance, decreased mobility,  difficulty walking, decreased ROM, decreased strength, impaired flexibility, impaired sensation, improper body mechanics, and postural dysfunction.    PLAN:  PT FREQUENCY: 2x/week  PT DURATION: 8 weeks  PLANNED INTERVENTIONS: Therapeutic exercises, Therapeutic activity, Neuromuscular re-education, Balance training, Gait training, Patient/Family education, Self Care, Joint mobilization, Stair training, Vestibular training, Dry Needling, Electrical stimulation, Cryotherapy, Moist heat, Taping, Vasopneumatic device, Ionotophoresis 4mg /ml Dexamethasone, Manual therapy, and Re-evaluation  PLAN FOR NEXT SESSION: Work on gross R LE strengthening, ROM, stability, balance. Focus primarily on hips and ankles.    Sanjuana Mae, PTA 03/16/2023, 10:15 AM

## 2023-03-17 ENCOUNTER — Ambulatory Visit: Payer: Federal, State, Local not specified - PPO | Admitting: Neurology

## 2023-03-17 ENCOUNTER — Encounter: Payer: Self-pay | Admitting: Neurology

## 2023-03-17 VITALS — BP 140/93 | HR 79 | Ht 66.0 in | Wt 213.5 lb

## 2023-03-17 DIAGNOSIS — M21371 Foot drop, right foot: Secondary | ICD-10-CM

## 2023-03-17 DIAGNOSIS — G35 Multiple sclerosis: Secondary | ICD-10-CM

## 2023-03-17 DIAGNOSIS — M25562 Pain in left knee: Secondary | ICD-10-CM

## 2023-03-17 DIAGNOSIS — R269 Unspecified abnormalities of gait and mobility: Secondary | ICD-10-CM

## 2023-03-17 DIAGNOSIS — Z79899 Other long term (current) drug therapy: Secondary | ICD-10-CM | POA: Diagnosis not present

## 2023-03-17 NOTE — Progress Notes (Signed)
[facility]  PATIENT: Karen Oconnor DOB: 12-25-1975  REFERRING DOCTOR OR PCP: Harvie Heck, MD; Megan Mans, NP SOURCE: Patient, notes from [facility] between 2013 and present, imaging and lab reports, MRI images will be personally reviewed when available an addendum will be added  _________________________________   HISTORICAL  CHIEF COMPLAINT:  Chief Complaint  Patient presents with   Room 11    Pt is here Alone. Pt states that a few weeks ago she went bowling with her work group and her left leg had swelling and pt is wanting her left leg to be added to her pt regiment. Pt states other than that she hasn't had any other symptoms.     HISTORY OF PRESENT ILLNESS:  Karen Oconnor is a 47 y.o. woman with multiple sclerosis.  UPDATE 03/17/2023: She is currently on Ocrevus and tolerates it well.    Next infusion in October, 2023.   She has no infusion reactions. She has had no exacerbations since starting in 2021.     At the last visit I checked anti-NMO and anti-MOG and both are negative.  IgG and IgM are in the normal range.     She went bowling 2 weeks ago and had a lot of swelling in her left knee .  She feels a tight band in her left knee.   She is doing PT for her right foot drop already and would like them to also help the knee  Gait is mildly off balanced and she has a right foot drop.    Some stumbles but no falls.   She uses the banister on stairs    She has right leg weakness and some spasticity.     She has altered sensation sensation in her right leg and a different altered sensation right flank with numbness.    Bladder function is doing ok with some urgency but no recent incontinence.   Bowel incontinence topped with d/c metformin     Vision was poor as a child and she feels it has been stable and she is much better with contacts.     Color vision is fine.   She never had diplopia.     She has fatigue but accomplishes daily goals.   She has  some insomnia, sleep onset worse than maintenance.About 3 nights a month.  Mood and cognition are doing well.    She also has type 2 NIDDM.   She had EBV and Varicella as a child.    Weight is up a few pounds the last 6 months and she has had mild HTN.  MS HISTORY In July 2013, she presented with electrical sock sensations in her back and legs followed by numbness in the right flank/abdomen and allodynia in her right leg.   She also had a right foot drop and spasticity.   She also has numbness in her left foot.   She was concerned about stroke and saw Dr. Corwin Levins who did an MRI of the cervical/thoracic spine.   She had a large non-enhancing lesion (was 3 months after 1st symptom) at T7-T11.   MRI of the brain also showed lesions and MS was diagnosed.   She was initially placed on Tecfidera and stayed on that medication until March 2021 after an MRI February 2021 showed additional multiple sclerosis foci.  She was placed on Ocrevus March 2021 and continues on that medication.  She has been seeing Dr. Melrose Nakayama at [facility].   Imaging (report): MRI  of the brain 12/09/2019 showed multiple T2/FLAIR hyperintense foci in the periventricular, juxtacortical and deep white matter.  2 of the foci, 1 high in the right frontal lobe and and other in the periventricular white matter adjacent to the left ventricular atrium.  MRI of the cervical and thoracic spine 12/09/2019 showed a focus at T7-T8 centrally towards the right and possible focus at C3-C4.  MRI report from 08/24/2012 showed multiple T2/FLAIR hyperintense foci in the cerebral hemispheres consistent with MS with enhancement of a focus in the left parietal lobe.  MRI of the cervical and thoracic spine 08/11/2012 shows a right hemicord focus from T7-T11 that did not enhance.  The cervical spinal cord was normal.  Laboratory tests: 11/28/2019 Quantiferon TB and hepatitis B and C tests were negative.  IgG and IgM and IgA were normal.  08/30/2012:  Anti-NMO IgG was negative  REVIEW OF SYSTEMS: Constitutional: No fevers, chills, sweats, or change in appetite Eyes: No visual changes, double vision, eye pain Ear, nose and throat: No hearing loss, ear pain, nasal congestion, sore throat Cardiovascular: No chest pain, palpitations Respiratory:  No shortness of breath at rest or with exertion.   No wheezes GastrointestinaI: No nausea, vomiting, diarrhea, abdominal pain, fecal incontinence Genitourinary:  No dysuria, urinary retention or frequency.  No nocturia. Musculoskeletal:  No neck pain, back pain Integumentary: No rash, pruritus, skin lesions Neurological: as above Psychiatric: No depression at this time.  No anxiety Endocrine: No palpitations, diaphoresis, change in appetite, change in weigh or increased thirst Hematologic/Lymphatic:  No anemia, purpura, petechiae. Allergic/Immunologic: No itchy/runny eyes, nasal congestion, recent allergic reactions, rashes  ALLERGIES: No Known Allergies  HOME MEDICATIONS:  Current Outpatient Medications:    Alogliptin Benzoate 25 MG TABS, Take 1 tablet by mouth daily., Disp: , Rfl:    baclofen (LIORESAL) 10 MG tablet, Take 1 tablet (10 mg total) by mouth 3 (three) times daily as needed., Disp: 270 each, Rfl: 1   Biotin 10 MG CAPS, Take 10,000 mcg by mouth daily., Disp: , Rfl:    Cholecalciferol 25 MCG (1000 UT) capsule, Take 5,000 Units by mouth daily., Disp: , Rfl:    FARXIGA 10 MG TABS tablet, Take 10 mg by mouth daily., Disp: , Rfl:    gabapentin (NEURONTIN) 300 MG capsule, Take 3 capsules (900 mg total) by mouth 2 (two) times daily., Disp: 540 capsule, Rfl: 1   naproxen (NAPROSYN) 250 MG tablet, Take 1 tablet (250 mg total) by mouth 2 (two) times daily as needed., Disp: 180 tablet, Rfl: 1   ocrelizumab (OCREVUS) 300 MG/10ML injection, Inject 600 mg into the vein every 6 (six) months., Disp: , Rfl:   PAST MEDICAL HISTORY: Past Medical History:  Diagnosis Date   Diabetes (HCC)    MS  (multiple sclerosis) (HCC)     PAST SURGICAL HISTORY:   FAMILY HISTORY: Family History  Problem Relation Age of Onset   Gout Mother    Diabetes Father     SOCIAL HISTORY:  Social History   Socioeconomic History   Marital status: Single    Spouse name: Not on file   Number of children: Not on file   Years of education: Not on file   Highest education level: Not on file  Occupational History   Not on file  Tobacco Use   Smoking status: Never   Smokeless tobacco: Never  Substance and Sexual Activity   Alcohol use: Yes    Comment: rare   Drug use: Never   Sexual activity:  Not on file  Other Topics Concern   Not on file  Social History Narrative   Right handed   Caffeine use: coffee/tea daily   Social Determinants of Health   Financial Resource Strain: Not on file  Food Insecurity: Not on file  Transportation Needs: Not on file  Physical Activity: Not on file  Stress: Not on file  Social Connections: Not on file  Intimate Partner Violence: Not on file     PHYSICAL EXAM  Vitals:   03/17/23 0859  BP: (!) 140/93  Pulse: 79  Weight: 213 lb 8 oz (96.8 kg)  Height: 5\' 6"  (1.676 m)    Body mass index is 34.46 kg/m.     General: The patient is well-developed and well-nourished and in no acute distress  HEENT:  Head is [place]/AT.  Sclera are anicteric.  Funduscopic exam shows normal optic discs and retinal vessels.  Neck: No carotid bruits are noted.  The neck is nontender.  Cardiovascular: The heart has a regular rate and rhythm with a normal S1 and S2. There were no murmurs, gallops or rubs.    Skin: Extremities are without rash or  edema.  Musculoskeletal:  Back is nontender.  The knee is slightly painful on the left to anterior drawer maneuver.  Valgus and varus and posterior drawer were fine   Neurologic Exam  Mental status: The patient is alert and oriented x 3 at the time of the examination. The patient has apparent normal recent and remote  memory, with an apparently normal attention span and concentration ability.   Speech is normal.  Cranial nerves: Extraocular movements are full. Pupils are equal, round, and reactive to light and accomodation.  V visual acuity was reduced OD versus OS.  Color vision was symmetric.  There is good facial sensation to soft touch bilaterally.Facial strength is normal.  Trapezius and sternocleidomastoid strength is normal. No dysarthria is noted.  The tongue is midline, and the patient has symmetric elevation of the soft palate. No obvious hearing deficits are noted.  Motor:  Muscle bulk is normal.   Tone is increased in the right leg. . Strength is  5 / 5 in arms and left leg but 4/5 proximal right leg and 4/5 EHL and ankle extension, 4+/5 in there muscles.  Sensory: She reported mild altered sensation in the right flank as well as altered sensation in the right leg and both feet.  Vibration sensation was minimally reduced in the right foot and temperature sensation felt different in the legs than the arms but was not necessarily reduced.  Coordination: Finger-nose-finger was performed well.  Heel-to-shin was reduced bilaterally  Gait and station: Station is normal.  She has a right foot drop and spastic right gait.  The tandem gait is poor.  Romberg was negative. . Reflexes: Deep tendon reflexes are symmetric and normal n arms but increased 3+ right and 3 left knee and anle.  Plantar responses are flexor.     ASSESSMENT AND PLAN  Multiple sclerosis (HCC) - Plan: IgG, IgA, IgM, CBC with Differential/Platelet, PR STATIC OR DYNAMI AFO PRE CST  Gait difficulty - Plan: PR STATIC OR DYNAMI AFO PRE CST  Right foot drop - Plan: PR STATIC OR DYNAMI AFO PRE CST  High risk medication use - Plan: IgG, IgA, IgM, CBC with Differential/Platelet  Left knee pain, unspecified chronicity  Continue Ocrevus.  IgG and IgM were well within normal range when last checked.  This will need to be checked again around  the time of the next visit.  If she continues to be stable we can hold off on MRI of the brain until 2025 to check more subclinical progression. Stay active and exercise as tolerated. Right AFO script (she will take to PT).  Additionally we requested PT to work on her knee pain as well as her gait Return in 6 months or sooner for new or worsening neurologic symptoms.  Bowen Goyal A. Epimenio Foot, MD, [facility] 03/17/2023, 12:39 PM Certified in Neurology, Clinical Neurophysiology, Sleep Medicine and Neuroimaging  [facility] [address], [address] [place], [place] [address] [phone]

## 2023-03-18 ENCOUNTER — Encounter: Payer: Self-pay | Admitting: Physical Therapy

## 2023-03-18 ENCOUNTER — Ambulatory Visit: Payer: Federal, State, Local not specified - PPO | Admitting: Physical Therapy

## 2023-03-18 DIAGNOSIS — R2681 Unsteadiness on feet: Secondary | ICD-10-CM

## 2023-03-18 DIAGNOSIS — M6281 Muscle weakness (generalized): Secondary | ICD-10-CM

## 2023-03-18 DIAGNOSIS — R2689 Other abnormalities of gait and mobility: Secondary | ICD-10-CM | POA: Diagnosis not present

## 2023-03-18 LAB — CBC WITH DIFFERENTIAL/PLATELET
Basophils Absolute: 0 10*3/uL (ref 0.0–0.2)
Basos: 1 %
EOS (ABSOLUTE): 0.1 10*3/uL (ref 0.0–0.4)
Eos: 4 %
Hematocrit: 42.9 % (ref 34.0–46.6)
Hemoglobin: 14 g/dL (ref 11.1–15.9)
Immature Grans (Abs): 0 10*3/uL (ref 0.0–0.1)
Immature Granulocytes: 0 %
Lymphocytes Absolute: 1.1 10*3/uL (ref 0.7–3.1)
Lymphs: 33 %
MCH: 27.9 pg (ref 26.6–33.0)
MCHC: 32.6 g/dL (ref 31.5–35.7)
MCV: 86 fL (ref 79–97)
Monocytes Absolute: 0.3 10*3/uL (ref 0.1–0.9)
Monocytes: 9 %
Neutrophils Absolute: 1.8 10*3/uL (ref 1.4–7.0)
Neutrophils: 53 %
Platelets: 283 10*3/uL (ref 150–450)
RBC: 5.02 x10E6/uL (ref 3.77–5.28)
RDW: 13.2 % (ref 11.7–15.4)
WBC: 3.4 10*3/uL (ref 3.4–10.8)

## 2023-03-18 LAB — IGG, IGA, IGM
IgA/Immunoglobulin A, Serum: 201 mg/dL (ref 87–352)
IgG (Immunoglobin G), Serum: 1342 mg/dL (ref 586–1602)
IgM (Immunoglobulin M), Srm: 30 mg/dL (ref 26–217)

## 2023-03-18 NOTE — Therapy (Signed)
OUTPATIENT PHYSICAL THERAPY NEURO TREATMENT   Patient Name: Karen Oconnor MRN: [id number] DOB:[birth year], 47 y.o., female Today's Date: 03/18/2023  PCP: Harvie Heck, MD REFERRING PROVIDER: Despina Arias, MD  END OF SESSION:  PT End of Session - 03/18/23 1059     Visit Number 6    Number of Visits 16    Date for PT Re-Evaluation 04/27/23    Authorization Type BCBS    PT Start Time 1100    PT Stop Time 1145    PT Time Calculation (min) 45 min    Activity Tolerance Patient tolerated treatment well    Behavior During Therapy [facility] for tasks assessed/performed              Past Medical History:  Diagnosis Date   Diabetes (HCC)    MS (multiple sclerosis) (HCC)    Past Surgical History:  Procedure Laterality Date   ABDOMINAL HYSTERECTOMY  2016   Patient Active Problem List   Diagnosis Date Noted   Transverse myelitis (HCC) 02/25/2022   Multiple sclerosis (HCC) 02/25/2022   Right foot drop 02/25/2022   High risk medication use 02/25/2022   Allodynia 02/25/2022    ONSET DATE: 2013  REFERRING DIAG:  G35 (ICD-10-CM) - Multiple sclerosis (HCC)  M21.371 (ICD-10-CM) - Right foot drop  R26.9 (ICD-10-CM) - Gait difficulty    THERAPY DIAG:  Other abnormalities of gait and mobility  Muscle weakness (generalized)  Unsteadiness on feet  Rationale for Evaluation and Treatment: Rehabilitation  SUBJECTIVE:                                                                                                                                                                                             SUBJECTIVE STATEMENT: Pt reports she saw neurologist who provided script for AFO and to add knee strengthening. Pt's L knee continues to bother her since last injury.    From eval: Pt reports weakness on R side. Pt states she feels that things have declined since COVID 2020 (reports she had stopped going to PT during that time). Getting in/out of a car is hard, stairs are hard. Pt  does have N/T in both feet. Pt states she does not have an AFO. Sometimes has dizziness and near falls. Pt accompanied by: self  PERTINENT HISTORY: Per H&P: was diagnosed with MS in 2013 after presenting with thoracic transverse myelitis and was found to have multiple other lesions consistent with MS and right leg spasticity/foot drop. She presented with electrical sock sensations in her back and legs followed by numbness in the right flank/abdomen and allodynia in her right leg. She also had a  right foot drop and spasticity. She also has numbness in her left foot. She was concerned about stroke and saw Dr. Corwin Levins who did an MRI of the cervical/thoracic spine. She had a large non-enhancing lesion (was 3 months after 1st symptom) at T7-T11. MRI of the brain also showed lesions and MS was diagnosed. 2 NIDDM. She had EBV and Varicella as a child   PAIN:  Are you having pain? No  PRECAUTIONS: Fall  WEIGHT BEARING RESTRICTIONS: No  FALLS: Has patient fallen in last 6 months? 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, contracting 3x/wk   PATIENT GOALS: "The main thing that brings me in is coordination, balance, strength, and flexibility."   OBJECTIVE: (Measures in this section from initial evaluation unless otherwise noted)  DIAGNOSTIC FINDINGS: Nothing in 2024  SENSATION: Bilat lateral calf into bottom of feet  MUSCLE TONE: +1 spasticity with R ankle DF  MUSCLE LENGTH: Hamstrings: Right 70 deg; Left 70 deg Thomas test: did not assess  POSTURE: weight shift left and R LE abducted, foot in pronation/ER  LOWER EXTREMITY ROM:     Active  Right Eval Left Eval  Hip flexion 95 115  Hip extension    Hip abduction    Hip adduction    Hip internal  rotation    Hip external rotation    Knee flexion    Knee extension    Ankle dorsiflexion 5 15  Ankle plantarflexion    Ankle inversion    Ankle eversion     (Blank rows = not tested)  LOWER EXTREMITY MMT:    MMT Right Eval Left Eval  Hip flexion 3- 5  Hip extension 3+ 3-  Hip abduction 3+ 3  Hip adduction    Hip internal rotation 3 5  Hip external rotation 3 3+  Knee flexion 3+ 5  Knee extension 4 5  Ankle dorsiflexion    Ankle plantarflexion    Ankle inversion    Ankle eversion    (Blank rows = not tested)  TRANSFERS: Floor:  needs assessment  STAIRS: Level of Assistance: Modified independence Stair Negotiation Technique: Step to Pattern Alternating Pattern  with Bilateral Rails Number of Stairs: 10  Height of Stairs: 4-6"  Comments: Step to pattern on ascent; reciprocal pattern on descent. Needs use of bilat UE support  GAIT: Gait pattern: step through pattern, decreased stance time- Right, decreased hip/knee flexion- Right, decreased ankle dorsiflexion- Right, circumduction- Right, lateral hip instability, lateral lean- Right, and lateral lean- Left; R foot externally rotated Distance walked: 75'x4 Assistive device utilized: None Level of assistance: Complete Independence Comments: See gait pattern above  FUNCTIONAL TESTS:  5 times sit to stand: TBA (limited due to some residual knee soreness after bowling) Berg Balance Scale: 43/56 Dynamic Gait Index: 18/24   PATIENT SURVEYS:  N/a  TODAY'S TREATMENT:    [facility] Adult PT Treatment:                                                DATE: 03/18/23 Therapeutic Exercise: Recumbent bike L3 x 6 min Gastroc stretch 2x30 sec Soleus stretch x30 sec Quadruped hip ext 2x10 Prone quad  stretch with strap 2x30 sec Quadruped rocking x10 Prone hamstring curl 2x10 red TB on L, eccentrics on R Quadruped fire hydrant x10 Half kneeling static balance 2x30 sec  [facility] Adult PT Treatment:                                                 DATE: 03/16/2023 Therapeutic Exercise: NuStep L6 x 5 min Mat Table: Seated knee ball squeeze + TA activation 10x5" Seated hip abd isometric (gait belt) 10x5" Rocker board ankle DF/PF 3x10 LTR --> wide leg wipers Marching in PPT x 10 --> R leg marching Standing hip abd --> RTB stabilizing at R ankle Seated 1/2 long sit:  ankle eversion (therapist stabilizing lower leg) DF/calf stretch w/strap Seated hip IR/ER AROM with slider STS --> L foot propped on yoga block    [facility] Adult PT Treatment:                                                DATE: 03/11/2023 Therapeutic Exercise: NuStep L6 x Seated: Rocker board ankle DF/PF 3x10 DF stretch slant board 2x30" Supine: Black super band: R knee flex/ext & straight leg raises: pt holding band --> cross body therapist holding band  Passive R piriformis stretch Marching in PPT 2x10 LTR  Side lying: Straight leg hip abd Front back leg raises (rainbows) Hip abduction leg raises in extension Standing hip 3 way slider --> GTB at lateral ankle                                                                                                                             PATIENT EDUCATION: Education details: Exam findings, POC, initial HEP Person educated: Patient Education method: Explanation, Demonstration, and Handouts Education comprehension: verbalized understanding, returned demonstration, and needs further education  HOME EXERCISE PROGRAM: Access Code: [email or web address] URL: [email or web address] Date: 03/02/2023 Prepared by: Vernon Prey April Kirstie Peri  Exercises - Standing Hip Abduction with Counter Support  - 1 x daily - 7 x weekly - 2-3 sets - 10 reps - Standing Hip Flexion with Counter Support  - 1 x daily - 7 x weekly - 2-3 sets - 10 reps - Standing Hip Extension with Counter Support  - 1 x daily - 7 x weekly - 2-3 sets - 10 reps - Standing Heel Raise with Support  - 1 x daily - 7 x weekly - 2-3 sets - 10  reps - Seated Toe Raise  - 1 x daily - 7 x weekly - 2-3 sets - 10 reps  GOALS: Goals reviewed with patient? Yes  SHORT TERM GOALS: Target date: 03/30/2023  Pt will be ind with initial HEP Baseline: Goal status: INITIAL  2.  Pt will be able to perform 5x STS without UE support to demo improving functional LE strength Baseline:  Goal status: INITIAL  3.  Pt will be able to amb with normal reciprocal gait x 400' for home mobility Baseline:  Goal status: INITIAL    LONG TERM GOALS: Target date: 04/27/2023  Pt will be ind with management and progression of HEP Baseline:  Goal status: INITIAL  2.  Pt will have improved Berg Balance Score to >/=50/56 to demo MCID Baseline:  Goal status: INITIAL  3.  Pt will have increased DGI to >/=20/24 to demo low fall risk Baseline:  Goal status: INITIAL  4.  Pt will be able to safely ascend/descend 20 stairs with reciprocal pattern and one HR for home mobility Baseline:  Goal status: INITIAL    ASSESSMENT:  CLINICAL IMPRESSION:  Continued to work on strengthening. Initiated quadruped exercises to reduce pt's reliance on quads for stability. Worked on core and hips primarily. Pt with very tight quads addressed with stretching and hamstring strengthening. Knee flexion bilat is <95 deg. Will try and provide AFO trials for the next 1-2 sessions.  From eval: Patient is a 47 y.o. F who was seen today for physical therapy evaluation and treatment for MS and weakness. Pt notes ongoing decline since 2020 with near falls. Assessment significant for gross R LE weakness with resultant decrease in balance and ROM affecting gait and mobility. Pt with R foot drop and is open to use of AFO. Pt is at a high risk of falls based on Berg Balance Test and DGI. Pt would benefit from PT to address these issues to maximize her level of function.   OBJECTIVE IMPAIRMENTS: Abnormal gait, decreased activity tolerance, decreased balance, decreased coordination,  decreased endurance, decreased mobility, difficulty walking, decreased ROM, decreased strength, impaired flexibility, impaired sensation, improper body mechanics, and postural dysfunction.    PLAN:  PT FREQUENCY: 2x/week  PT DURATION: 8 weeks  PLANNED INTERVENTIONS: Therapeutic exercises, Therapeutic activity, Neuromuscular re-education, Balance training, Gait training, Patient/Family education, Self Care, Joint mobilization, Stair training, Vestibular training, Dry Needling, Electrical stimulation, Cryotherapy, Moist heat, Taping, Vasopneumatic device, Ionotophoresis 4mg /ml Dexamethasone, Manual therapy, and Re-evaluation  PLAN FOR NEXT SESSION: Trial resistance bands with standing hip strengthening. Trial AFOs (April will try and borrow from [facility]). Work on gross R LE strengthening, ROM, stability, balance. Focus primarily on hips and ankles.    Bracen Schum April Ma L Sid Greener, PT 03/18/2023, 10:59 AM

## 2023-03-23 ENCOUNTER — Ambulatory Visit: Payer: Federal, State, Local not specified - PPO

## 2023-03-23 DIAGNOSIS — R2681 Unsteadiness on feet: Secondary | ICD-10-CM

## 2023-03-23 DIAGNOSIS — R2689 Other abnormalities of gait and mobility: Secondary | ICD-10-CM

## 2023-03-23 DIAGNOSIS — M6281 Muscle weakness (generalized): Secondary | ICD-10-CM

## 2023-03-23 NOTE — Therapy (Signed)
OUTPATIENT PHYSICAL THERAPY NEURO TREATMENT   Patient Name: Karen Oconnor MRN: [id number] DOB:1975/12/25, 47 y.o., female Today's Date: 03/23/2023  PCP: Harvie Heck, MD REFERRING PROVIDER: Despina Arias, MD  END OF SESSION:  PT End of Session - 03/23/23 1100     Visit Number 7    Number of Visits 16    Date for PT Re-Evaluation 04/27/23    Authorization Type BCBS    PT Start Time 1100    PT Stop Time 1143    PT Time Calculation (min) 43 min    Activity Tolerance Patient tolerated treatment well    Behavior During Therapy WFL for tasks assessed/performed              Past Medical History:  Diagnosis Date   Diabetes (HCC)    MS (multiple sclerosis) (HCC)    Past Surgical History:  Procedure Laterality Date   ABDOMINAL HYSTERECTOMY  2016   Patient Active Problem List   Diagnosis Date Noted   Transverse myelitis (HCC) 02/25/2022   Multiple sclerosis (HCC) 02/25/2022   Right foot drop 02/25/2022   High risk medication use 02/25/2022   Allodynia 02/25/2022    ONSET DATE: 2013  REFERRING DIAG:  G35 (ICD-10-CM) - Multiple sclerosis (HCC)  M21.371 (ICD-10-CM) - Right foot drop  R26.9 (ICD-10-CM) - Gait difficulty    THERAPY DIAG:  Other abnormalities of gait and mobility  Muscle weakness (generalized)  Unsteadiness on feet  Rationale for Evaluation and Treatment: Rehabilitation  SUBJECTIVE:                                                                                                                                                                                             SUBJECTIVE STATEMENT: Patient reports she had a free trial at [facility], where they focused on stretching her lower body. Patient states she was sore in bilateral knees after last session  From eval: Pt reports weakness on R side. Pt states she feels that things have declined since COVID 2020 (reports she had stopped going to PT during that time). Getting in/out of a car is  hard, stairs are hard. Pt does have N/T in both feet. Pt states she does not have an AFO. Sometimes has dizziness and near falls. Pt accompanied by: self  PERTINENT HISTORY: Per H&P: was diagnosed with MS in 2013 after presenting with thoracic transverse myelitis and was found to have multiple other lesions consistent with MS and right leg spasticity/foot drop. She presented with electrical sock sensations in her back and legs followed by numbness in the right flank/abdomen and allodynia in her right leg. She  also had a right foot drop and spasticity. She also has numbness in her left foot. She was concerned about stroke and saw Dr. Corwin Levins who did an MRI of the cervical/thoracic spine. She had a large non-enhancing lesion (was 3 months after 1st symptom) at T7-T11. MRI of the brain also showed lesions and MS was diagnosed. 2 NIDDM. She had EBV and Varicella as a child   PAIN:  Are you having pain? No  PRECAUTIONS: Fall  WEIGHT BEARING RESTRICTIONS: No  FALLS: Has patient fallen in last 6 months? 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, contracting 3x/wk   PATIENT GOALS: "The main thing that brings me in is coordination, balance, strength, and flexibility."   OBJECTIVE: (Measures in this section from initial evaluation unless otherwise noted)  DIAGNOSTIC FINDINGS: Nothing in 2024  SENSATION: Bilat lateral calf into bottom of feet  MUSCLE TONE: +1 spasticity with R ankle DF  MUSCLE LENGTH: Hamstrings: Right 70 deg; Left 70 deg Thomas test: did not assess  POSTURE: weight shift left and R LE abducted, foot in pronation/ER  LOWER EXTREMITY ROM:     Active  Right Eval Left Eval  Hip flexion 95 115  Hip extension    Hip abduction    Hip  adduction    Hip internal rotation    Hip external rotation    Knee flexion    Knee extension    Ankle dorsiflexion 5 15  Ankle plantarflexion    Ankle inversion    Ankle eversion     (Blank rows = not tested)  LOWER EXTREMITY MMT:    MMT Right Eval Left Eval  Hip flexion 3- 5  Hip extension 3+ 3-  Hip abduction 3+ 3  Hip adduction    Hip internal rotation 3 5  Hip external rotation 3 3+  Knee flexion 3+ 5  Knee extension 4 5  Ankle dorsiflexion    Ankle plantarflexion    Ankle inversion    Ankle eversion    (Blank rows = not tested)  TRANSFERS: Floor:  needs assessment  STAIRS: Level of Assistance: Modified independence Stair Negotiation Technique: Step to Pattern Alternating Pattern  with Bilateral Rails Number of Stairs: 10  Height of Stairs: 4-6"  Comments: Step to pattern on ascent; reciprocal pattern on descent. Needs use of bilat UE support  GAIT: Gait pattern: step through pattern, decreased stance time- Right, decreased hip/knee flexion- Right, decreased ankle dorsiflexion- Right, circumduction- Right, lateral hip instability, lateral lean- Right, and lateral lean- Left; R foot externally rotated Distance walked: 75'x4 Assistive device utilized: None Level of assistance: Complete Independence Comments: See gait pattern above  FUNCTIONAL TESTS:  5 times sit to stand: TBA (limited due to some residual knee soreness after bowling) Berg Balance Scale: 43/56 Dynamic Gait Index: 18/24   PATIENT SURVEYS:  N/a  TODAY'S TREATMENT:    [facility] Adult PT Treatment:                                                DATE: 03/23/2023 Therapeutic Exercise: Recumbent bike L3 x 6 min Gastroc/soleus stretches at wall 2x30" Standing: Hip abd YTB crossed at  ankles Side stepping YTB  Bent over hip ext 2x15 Bent over HS curls in ext x 8, x 10 Seated R HS stretch w/strap (foot on stool) Therapeutic Activity: AFO fitting and walking trial   [facility] Adult [facility] Treatment:                                                 DATE: 03/18/23 Therapeutic Exercise: Recumbent bike L3 x 6 min Gastroc stretch 2x30 sec Soleus stretch x30 sec Quadruped hip ext 2x10 Prone quad stretch with strap 2x30 sec Quadruped rocking x10 Prone hamstring curl 2x10 red TB on L, eccentrics on R Quadruped fire hydrant x10 Half kneeling static balance 2x30 sec   [facility] Adult PT Treatment:                                                DATE: 03/16/2023 Therapeutic Exercise: NuStep L6 x 5 min Mat Table: Seated knee ball squeeze + TA activation 10x5" Seated hip abd isometric (gait belt) 10x5" Rocker board ankle DF/PF 3x10 LTR --> wide leg wipers Marching in PPT x 10 --> R leg marching Standing hip abd --> RTB stabilizing at R ankle Seated 1/2 long sit:  ankle eversion (therapist stabilizing lower leg) DF/calf stretch w/strap Seated hip IR/ER AROM with slider STS --> L foot propped on yoga block                                                                                                                          PATIENT EDUCATION: Education details: Exam findings, POC, initial HEP Person educated: Patient Education method: Explanation, Demonstration, and Handouts Education comprehension: verbalized understanding, returned demonstration, and needs further education  HOME EXERCISE PROGRAM: Access Code: [email or web address] URL: [email or web address] Date: 03/02/2023 Prepared by: Vernon Prey April Kirstie Peri  Exercises - Standing Hip Abduction with Counter Support  - 1 x daily - 7 x weekly - 2-3 sets - 10 reps - Standing Hip Flexion with Counter Support  - 1 x daily - 7 x weekly - 2-3 sets - 10 reps - Standing Hip Extension with Counter Support  - 1 x daily - 7 x weekly - 2-3 sets - 10 reps - Standing Heel Raise with Support  - 1 x daily - 7 x weekly - 2-3 sets - 10 reps - Seated Toe Raise  - 1 x daily - 7 x weekly - 2-3 sets - 10 reps  GOALS: Goals reviewed with patient?  Yes  SHORT TERM GOALS: Target date: 03/30/2023  Pt will be ind with initial HEP Baseline: Goal status: INITIAL  2.  Pt will be able to perform 5x STS without UE support to demo improving functional LE strength Baseline:  Goal status: INITIAL  3.  Pt will be able to amb with normal reciprocal gait x 400' for home mobility Baseline:  Goal status: INITIAL    LONG TERM GOALS: Target date: 04/27/2023  Pt will be ind with management and progression of HEP Baseline:  Goal status: INITIAL  2.  Pt will have improved Berg Balance Score to >/=50/56 to demo MCID Baseline:  Goal status: INITIAL  3.  Pt will have increased DGI to >/=20/24 to demo low fall risk Baseline:  Goal status: INITIAL  4.  Pt will be able to safely ascend/descend 20 stairs with reciprocal pattern and one HR for home mobility Baseline:  Goal status: INITIAL    ASSESSMENT:  CLINICAL IMPRESSION:  Trial with AFO to introduce patient to device. Standing hip strengthening progressed with light resistance. Hamstring strengthening continued with progression to standing hamstring curls.  From eval: Patient is a 47 y.o. F who was seen today for physical therapy evaluation and treatment for MS and weakness. Pt notes ongoing decline since 2020 with near falls. Assessment significant for gross R LE weakness with resultant decrease in balance and ROM affecting gait and mobility. Pt with R foot drop and is open to use of AFO. Pt is at a high risk of falls based on Berg Balance Test and DGI. Pt would benefit from PT to address these issues to maximize her level of function.   OBJECTIVE IMPAIRMENTS: Abnormal gait, decreased activity tolerance, decreased balance, decreased coordination, decreased endurance, decreased mobility, difficulty walking, decreased ROM, decreased strength, impaired flexibility, impaired sensation, improper body mechanics, and postural dysfunction.    PLAN:  PT FREQUENCY: 2x/week  PT DURATION: 8  weeks  PLANNED INTERVENTIONS: Therapeutic exercises, Therapeutic activity, Neuromuscular re-education, Balance training, Gait training, Patient/Family education, Self Care, Joint mobilization, Stair training, Vestibular training, Dry Needling, Electrical stimulation, Cryotherapy, Moist heat, Taping, Vasopneumatic device, Ionotophoresis 4mg /ml Dexamethasone, Manual therapy, and Re-evaluation  PLAN FOR NEXT SESSION: Work on gross R LE strengthening, ROM, stability, balance. Focus primarily on hips and ankles.    Sanjuana Mae, PTA 03/23/2023, 11:52 AM

## 2023-03-24 ENCOUNTER — Ambulatory Visit: Payer: Federal, State, Local not specified - PPO | Admitting: Neurology

## 2023-03-25 ENCOUNTER — Ambulatory Visit: Payer: Federal, State, Local not specified - PPO | Admitting: Physical Therapy

## 2023-03-25 ENCOUNTER — Encounter: Payer: Self-pay | Admitting: Physical Therapy

## 2023-03-25 DIAGNOSIS — M6281 Muscle weakness (generalized): Secondary | ICD-10-CM

## 2023-03-25 DIAGNOSIS — R2681 Unsteadiness on feet: Secondary | ICD-10-CM

## 2023-03-25 DIAGNOSIS — R2689 Other abnormalities of gait and mobility: Secondary | ICD-10-CM | POA: Diagnosis not present

## 2023-03-25 NOTE — Therapy (Signed)
OUTPATIENT PHYSICAL THERAPY NEURO TREATMENT   Patient Name: Karen Oconnor MRN: [id number] DOB:July 21, 1976, 47 y.o., female Today's Date: 03/25/2023  PCP: Harvie Heck, MD REFERRING PROVIDER: Despina Arias, MD  END OF SESSION:  PT End of Session - 03/25/23 0931     Visit Number 8    Number of Visits 16    Date for PT Re-Evaluation 04/27/23    Authorization Type BCBS    PT Start Time 0930    PT Stop Time 1015    PT Time Calculation (min) 45 min    Activity Tolerance Patient tolerated treatment well    Behavior During Therapy [facility] for tasks assessed/performed              Past Medical History:  Diagnosis Date   Diabetes (HCC)    MS (multiple sclerosis) (HCC)    Past Surgical History:  Procedure Laterality Date   ABDOMINAL HYSTERECTOMY  2016   Patient Active Problem List   Diagnosis Date Noted   Transverse myelitis (HCC) 02/25/2022   Multiple sclerosis (HCC) 02/25/2022   Right foot drop 02/25/2022   High risk medication use 02/25/2022   Allodynia 02/25/2022    ONSET DATE: 2013  REFERRING DIAG:  G35 (ICD-10-CM) - Multiple sclerosis (HCC)  M21.371 (ICD-10-CM) - Right foot drop  R26.9 (ICD-10-CM) - Gait difficulty    THERAPY DIAG:  Other abnormalities of gait and mobility  Unsteadiness on feet  Muscle weakness (generalized)  Rationale for Evaluation and Treatment: Rehabilitation  SUBJECTIVE:                                                                                                                                                                                             SUBJECTIVE STATEMENT: Pt notes no new complaints. Will go back on [facility] to [facility].   From eval: Pt reports weakness on R side. Pt states she feels that things have declined since COVID 2020 (reports she had stopped going to PT during that time). Getting in/out of a car is hard, stairs are hard. Pt does have N/T in both feet. Pt states she does not have an AFO. Sometimes  has dizziness and near falls. Pt accompanied by: self  PERTINENT HISTORY: Per H&P: was diagnosed with MS in 2013 after presenting with thoracic transverse myelitis and was found to have multiple other lesions consistent with MS and right leg spasticity/foot drop. She presented with electrical sock sensations in her back and legs followed by numbness in the right flank/abdomen and allodynia in her right leg. She also had a right foot drop and spasticity. She also has numbness in her left  foot. She was concerned about stroke and saw Dr. Corwin Levins who did an MRI of the cervical/thoracic spine. She had a large non-enhancing lesion (was 3 months after 1st symptom) at T7-T11. MRI of the brain also showed lesions and MS was diagnosed. 2 NIDDM. She had EBV and Varicella as a child   PAIN:  Are you having pain? No  PRECAUTIONS: Fall  WEIGHT BEARING RESTRICTIONS: No  FALLS: Has patient fallen in last 6 months? 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, contracting 3x/wk   PATIENT GOALS: "The main thing that brings me in is coordination, balance, strength, and flexibility."   OBJECTIVE: (Measures in this section from initial evaluation unless otherwise noted)  DIAGNOSTIC FINDINGS: Nothing in 2024  SENSATION: Bilat lateral calf into bottom of feet  MUSCLE TONE: +1 spasticity with R ankle DF  MUSCLE LENGTH: Hamstrings: Right 70 deg; Left 70 deg Thomas test: did not assess  POSTURE: weight shift left and R LE abducted, foot in pronation/ER  LOWER EXTREMITY ROM:     Active  Right Eval Left Eval  Hip flexion 95 115  Hip extension    Hip abduction    Hip adduction    Hip internal rotation    Hip external rotation    Knee flexion    Knee extension     Ankle dorsiflexion 5 15  Ankle plantarflexion    Ankle inversion    Ankle eversion     (Blank rows = not tested)  LOWER EXTREMITY MMT:    MMT Right Eval Left Eval  Hip flexion 3- 5  Hip extension 3+ 3-  Hip abduction 3+ 3  Hip adduction    Hip internal rotation 3 5  Hip external rotation 3 3+  Knee flexion 3+ 5  Knee extension 4 5  Ankle dorsiflexion    Ankle plantarflexion    Ankle inversion    Ankle eversion    (Blank rows = not tested)  TRANSFERS: Floor:  needs assessment  STAIRS: Level of Assistance: Modified independence Stair Negotiation Technique: Step to Pattern Alternating Pattern  with Bilateral Rails Number of Stairs: 10  Height of Stairs: 4-6"  Comments: Step to pattern on ascent; reciprocal pattern on descent. Needs use of bilat UE support  GAIT: Gait pattern: step through pattern, decreased stance time- Right, decreased hip/knee flexion- Right, decreased ankle dorsiflexion- Right, circumduction- Right, lateral hip instability, lateral lean- Right, and lateral lean- Left; R foot externally rotated Distance walked: 75'x4 Assistive device utilized: None Level of assistance: Complete Independence Comments: See gait pattern above  FUNCTIONAL TESTS:  5 times sit to stand: TBA (limited due to some residual knee soreness after bowling) Berg Balance Scale: 43/56 Dynamic Gait Index: 18/24   PATIENT SURVEYS:  N/a  TODAY'S TREATMENT:    [facility] Adult PT Treatment:                                                DATE: 03/25/23 Therapeutic Exercise: Recumbent bike L3 x 6 min S/L hip flexion x10, eccentrics x10 red TB Supine clamshell red TB 2x10 Therapeutic Activity: Step over obstacle x10 Side step over obstacle x10 Amb fwd  and step over 2 pool noodles 2x20' Side step and step over 2 pool noodles 4 x10' Forward step up 4" step 2x10 with UE and then no UE support Gait: Amb x 3 laps with AFO, cues for knee flex/ext during toe off/heel strike   [facility]  Adult PT Treatment:                                                DATE: 03/23/2023 Therapeutic Exercise: Recumbent bike L3 x 6 min Gastroc/soleus stretches at wall 2x30" Standing: Hip abd YTB crossed at ankles Side stepping YTB  Bent over hip ext 2x15 Bent over HS curls in ext x 8, x 10 Seated R HS stretch w/strap (foot on stool) Therapeutic Activity: AFO fitting and walking trial                                                                                                                           PATIENT EDUCATION: Education details: Exam findings, POC, initial HEP Person educated: Patient Education method: Explanation, Demonstration, and Handouts Education comprehension: verbalized understanding, returned demonstration, and needs further education  HOME EXERCISE PROGRAM: Access Code: [email or web address] URL: [email or web address] Date: 03/02/2023 Prepared by: Vernon Prey April Kirstie Peri  Exercises - Standing Hip Abduction with Counter Support  - 1 x daily - 7 x weekly - 2-3 sets - 10 reps - Standing Hip Flexion with Counter Support  - 1 x daily - 7 x weekly - 2-3 sets - 10 reps - Standing Hip Extension with Counter Support  - 1 x daily - 7 x weekly - 2-3 sets - 10 reps - Standing Heel Raise with Support  - 1 x daily - 7 x weekly - 2-3 sets - 10 reps - Seated Toe Raise  - 1 x daily - 7 x weekly - 2-3 sets - 10 reps  GOALS: Goals reviewed with patient? Yes  SHORT TERM GOALS: Target date: 03/30/2023  Pt will be ind with initial HEP Baseline: Goal status: INITIAL  2.  Pt will be able to perform 5x STS without UE support to demo improving functional LE strength Baseline:  Goal status: INITIAL  3.  Pt will be able to amb with normal reciprocal gait x 400' for home mobility Baseline:  Goal status: INITIAL    LONG TERM GOALS: Target date: 04/27/2023  Pt will be ind with management and progression of HEP Baseline:  Goal status: INITIAL  2.  Pt will have improved  Berg Balance Score to >/=50/56 to demo MCID Baseline:  Goal status: INITIAL  3.  Pt will have increased DGI to >/=20/24 to demo low fall risk Baseline:  Goal status: INITIAL  4.  Pt will be able to safely ascend/descend 20 stairs with reciprocal pattern and one HR for home mobility Baseline:  Goal status: INITIAL    ASSESSMENT:  CLINICAL IMPRESSION:  Continued to trial AFO with amb and functional tasks such as stepping over obstacles and using stairs.   From eval: Patient is a 47 y.o. F who was seen today for physical therapy evaluation and treatment for MS and weakness. Pt notes ongoing decline since 2020 with near falls. Assessment significant for gross R LE weakness with resultant decrease in balance and ROM affecting gait and mobility. Pt with R foot drop and is open to use of AFO. Pt is at a high risk of falls based on Berg Balance Test and DGI. Pt would benefit from PT to address these issues to maximize her level of function.   OBJECTIVE IMPAIRMENTS: Abnormal gait, decreased activity tolerance, decreased balance, decreased coordination, decreased endurance, decreased mobility, difficulty walking, decreased ROM, decreased strength, impaired flexibility, impaired sensation, improper body mechanics, and postural dysfunction.    PLAN:  PT FREQUENCY: 2x/week  PT DURATION: 8 weeks  PLANNED INTERVENTIONS: Therapeutic exercises, Therapeutic activity, Neuromuscular re-education, Balance training, Gait training, Patient/Family education, Self Care, Joint mobilization, Stair training, Vestibular training, Dry Needling, Electrical stimulation, Cryotherapy, Moist heat, Taping, Vasopneumatic device, Ionotophoresis 4mg /ml Dexamethasone, Manual therapy, and Re-evaluation  PLAN FOR NEXT SESSION: Work on gross R LE strengthening, ROM, stability, balance. Focus primarily on hips and ankles.    Kore Madlock April Ma L Charlestine Rookstool, PT 03/25/2023, 9:32 AM

## 2023-03-30 ENCOUNTER — Ambulatory Visit: Payer: Federal, State, Local not specified - PPO | Attending: Neurology

## 2023-03-30 DIAGNOSIS — M6281 Muscle weakness (generalized): Secondary | ICD-10-CM

## 2023-03-30 DIAGNOSIS — R2681 Unsteadiness on feet: Secondary | ICD-10-CM

## 2023-03-30 DIAGNOSIS — R2689 Other abnormalities of gait and mobility: Secondary | ICD-10-CM | POA: Diagnosis present

## 2023-03-30 NOTE — Therapy (Signed)
OUTPATIENT PHYSICAL THERAPY NEURO TREATMENT   Patient Name: Karen Oconnor MRN: [id number] DOB:July 31, 1976, 47 y.o., female Today's Date: 03/30/2023  PCP: Harvie Heck, MD REFERRING PROVIDER: Despina Arias, MD  END OF SESSION:  PT End of Session - 03/30/23 1100     Visit Number 9    Number of Visits 16    Date for PT Re-Evaluation 04/27/23    Authorization Type [employer]    PT Start Time 1100    PT Stop Time 1150    PT Time Calculation (min) 50 min    Activity Tolerance Patient tolerated treatment well    Behavior During Therapy [facility] for tasks assessed/performed              Past Medical History:  Diagnosis Date   Diabetes (HCC)    MS (multiple sclerosis) (HCC)    Past Surgical History:  Procedure Laterality Date   ABDOMINAL HYSTERECTOMY  2016   Patient Active Problem List   Diagnosis Date Noted   Transverse myelitis (HCC) 02/25/2022   Multiple sclerosis (HCC) 02/25/2022   Right foot drop 02/25/2022   High risk medication use 02/25/2022   Allodynia 02/25/2022    ONSET DATE: 2013  REFERRING DIAG:  G35 (ICD-10-CM) - Multiple sclerosis (HCC)  M21.371 (ICD-10-CM) - Right foot drop  R26.9 (ICD-10-CM) - Gait difficulty    THERAPY DIAG:  Other abnormalities of gait and mobility  Unsteadiness on feet  Muscle weakness (generalized)  Rationale for Evaluation and Treatment: Rehabilitation  SUBJECTIVE:                                                                                                                                                                                             SUBJECTIVE STATEMENT: Patient reports she had her stretch session on Monday and feels it is helping.    From eval: Pt reports weakness on R side. Pt states she feels that things have declined since COVID 2020 (reports she had stopped going to PT during that time). Getting in/out of a car is hard, stairs are hard. Pt does have N/T in both feet. Pt states she does not have an AFO.  Sometimes has dizziness and near falls. Pt accompanied by: self  PERTINENT HISTORY: Per H&P: was diagnosed with MS in 2013 after presenting with thoracic transverse myelitis and was found to have multiple other lesions consistent with MS and right leg spasticity/foot drop. She presented with electrical sock sensations in her back and legs followed by numbness in the right flank/abdomen and allodynia in her right leg. She also had a right foot drop and spasticity. She also has numbness in her  left foot. She was concerned about stroke and saw Dr. Corwin Levins who did an MRI of the cervical/thoracic spine. She had a large non-enhancing lesion (was 3 months after 1st symptom) at T7-T11. MRI of the brain also showed lesions and MS was diagnosed. 2 NIDDM. She had EBV and Varicella as a child   PAIN:  Are you having pain? No  PRECAUTIONS: Fall  WEIGHT BEARING RESTRICTIONS: No  FALLS: Has patient fallen in last 6 months? 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, contracting 3x/wk   PATIENT GOALS: "The main thing that brings me in is coordination, balance, strength, and flexibility."   OBJECTIVE: (Measures in this section from initial evaluation unless otherwise noted)  DIAGNOSTIC FINDINGS: Nothing in 2024  SENSATION: Bilat lateral calf into bottom of feet  MUSCLE TONE: +1 spasticity with R ankle DF  MUSCLE LENGTH: Hamstrings: Right 70 deg; Left 70 deg Thomas test: did not assess  POSTURE: weight shift left and R LE abducted, foot in pronation/ER  LOWER EXTREMITY ROM:     Active  Right Eval Left Eval  Hip flexion 95 115  Hip extension    Hip abduction    Hip adduction    Hip internal rotation    Hip external rotation    Knee flexion    Knee  extension    Ankle dorsiflexion 5 15  Ankle plantarflexion    Ankle inversion    Ankle eversion     (Blank rows = not tested)  LOWER EXTREMITY MMT:    MMT Right Eval Left Eval  Hip flexion 3- 5  Hip extension 3+ 3-  Hip abduction 3+ 3  Hip adduction    Hip internal rotation 3 5  Hip external rotation 3 3+  Knee flexion 3+ 5  Knee extension 4 5  Ankle dorsiflexion    Ankle plantarflexion    Ankle inversion    Ankle eversion    (Blank rows = not tested)  TRANSFERS: Floor:  needs assessment  STAIRS: Level of Assistance: Modified independence Stair Negotiation Technique: Step to Pattern Alternating Pattern  with Bilateral Rails Number of Stairs: 10  Height of Stairs: 4-6"  Comments: Step to pattern on ascent; reciprocal pattern on descent. Needs use of bilat UE support  GAIT: Gait pattern: step through pattern, decreased stance time- Right, decreased hip/knee flexion- Right, decreased ankle dorsiflexion- Right, circumduction- Right, lateral hip instability, lateral lean- Right, and lateral lean- Left; R foot externally rotated Distance walked: 75'x4 Assistive device utilized: None Level of assistance: Complete Independence Comments: See gait pattern above  FUNCTIONAL TESTS:  5 times sit to stand: TBA (limited due to some residual knee soreness after bowling) Berg Balance Scale: 43/56 Dynamic Gait Index: 18/24   PATIENT SURVEYS:  N/a  TODAY'S TREATMENT:   [facility] Adult PT Treatment:                                                DATE: 03/30/2023 Therapeutic Exercise: Recumbent bike L3 x Supine clamshells RTB x20  S/L clamshells RTB x20 Standing squats RTB above knees x15  Therapeutic Activity: STS x 5 no HHA Fitting AFO with sneakers  Fwd amb with AFO 80x3L (240') Fwd stepping over noodles & yoga blocks (various heights) & cone weaving     [facility] Adult PT Treatment:                                                DATE: 03/25/23 Therapeutic  Exercise: Recumbent bike L3 x 6 min S/L hip flexion x10, eccentrics x10 red TB Supine clamshell red TB 2x10 Therapeutic Activity: Step over obstacle x10 Side step over obstacle x10 Amb fwd and step over 2 pool noodles 2x20' Side step and step over 2 pool noodles 4 x10' Forward step up 4" step 2x10 with UE and then no UE support Gait: Amb x 3 laps with AFO, cues for knee flex/ext during toe off/heel strike   [facility] Adult PT Treatment:                                                DATE: 03/23/2023 Therapeutic Exercise: Recumbent bike L3 x 6 min Gastroc/soleus stretches at wall 2x30" Standing: Hip abd YTB crossed at ankles Side stepping YTB  Bent over hip ext 2x15 Bent over HS curls in ext x 8, x 10 Seated R HS stretch w/strap (foot on stool) Therapeutic Activity: AFO fitting and walking trial                                                                                                                          PATIENT EDUCATION: Education details: Exam findings, POC, initial HEP Person educated: Patient Education method: Explanation, Demonstration, and Handouts Education comprehension: verbalized understanding, returned demonstration, and needs further education  HOME EXERCISE PROGRAM: Access Code: [email or web address] URL: [email or web address] Date: 03/02/2023 Prepared by: Vernon Prey April Kirstie Peri  Exercises - Standing Hip Abduction with Counter Support  - 1 x daily - 7 x weekly - 2-3 sets - 10 reps - Standing Hip Flexion with Counter Support  - 1 x daily - 7 x weekly - 2-3 sets - 10 reps - Standing Hip Extension with Counter Support  - 1 x daily - 7 x weekly - 2-3 sets - 10 reps - Standing Heel Raise with Support  - 1 x daily - 7 x weekly - 2-3 sets - 10 reps - Seated Toe Raise  - 1 x daily - 7 x weekly - 2-3 sets - 10 reps  GOALS: Goals reviewed with patient? Yes  SHORT TERM GOALS: Target date: 03/30/2023  Pt will be ind with initial HEP Baseline: Goal status:  MET  2.  Pt will be able to perform 5x STS without UE support to demo improving functional LE strength Baseline:  Goal status: MET  3.  Pt will be able to amb with  normal reciprocal gait x 400' for home mobility Baseline:  Goal status: IN PROGRESS    LONG TERM GOALS: Target date: 04/27/2023  Pt will be ind with management and progression of HEP Baseline:  Goal status: INITIAL  2.  Pt will have improved Berg Balance Score to >/=50/56 to demo MCID Baseline:  Goal status: INITIAL  3.  Pt will have increased DGI to >/=20/24 to demo low fall risk Baseline:  Goal status: INITIAL  4.  Pt will be able to safely ascend/descend 20 stairs with reciprocal pattern and one HR for home mobility Baseline:  Goal status: INITIAL    ASSESSMENT:  CLINICAL IMPRESSION:  Patient demonstrated reciprocal gait pattern while donning AFO; able to complete 240' of walking before requiring rest break. Initial R circumductive pattern with high stepping over obstacles, however form improved with repetition.    From eval: Patient is a 47 y.o. F who was seen today for physical therapy evaluation and treatment for MS and weakness. Pt notes ongoing decline since 2020 with near falls. Assessment significant for gross R LE weakness with resultant decrease in balance and ROM affecting gait and mobility. Pt with R foot drop and is open to use of AFO. Pt is at a high risk of falls based on Berg Balance Test and DGI. Pt would benefit from PT to address these issues to maximize her level of function.   OBJECTIVE IMPAIRMENTS: Abnormal gait, decreased activity tolerance, decreased balance, decreased coordination, decreased endurance, decreased mobility, difficulty walking, decreased ROM, decreased strength, impaired flexibility, impaired sensation, improper body mechanics, and postural dysfunction.    PLAN:  PT FREQUENCY: 2x/week  PT DURATION: 8 weeks  PLANNED INTERVENTIONS: Therapeutic exercises, Therapeutic  activity, Neuromuscular re-education, Balance training, Gait training, Patient/Family education, Self Care, Joint mobilization, Stair training, Vestibular training, Dry Needling, Electrical stimulation, Cryotherapy, Moist heat, Taping, Vasopneumatic device, Ionotophoresis 4mg /ml Dexamethasone, Manual therapy, and Re-evaluation  PLAN FOR NEXT SESSION: Work on gross R LE strengthening, ROM, stability, balance. Focus primarily on hips and ankles.    Sanjuana Mae, PTA 03/30/2023, 12:04 PM

## 2023-04-01 ENCOUNTER — Encounter: Payer: Federal, State, Local not specified - PPO | Admitting: Physical Therapy

## 2023-04-06 ENCOUNTER — Ambulatory Visit: Payer: Federal, State, Local not specified - PPO

## 2023-04-06 DIAGNOSIS — M6281 Muscle weakness (generalized): Secondary | ICD-10-CM

## 2023-04-06 DIAGNOSIS — R2689 Other abnormalities of gait and mobility: Secondary | ICD-10-CM

## 2023-04-06 DIAGNOSIS — R2681 Unsteadiness on feet: Secondary | ICD-10-CM

## 2023-04-06 NOTE — Therapy (Signed)
OUTPATIENT PHYSICAL THERAPY NEURO TREATMENT   Patient Name: Karen Oconnor MRN: [id number] DOB:08-21-1976, 47 y.o., female Today's Date: 04/06/2023  PCP: Harvie Heck, MD REFERRING PROVIDER: Despina Arias, MD  END OF SESSION:  PT End of Session - 04/06/23 1102     Visit Number 10    Number of Visits 16    Date for PT Re-Evaluation 04/27/23    PT Start Time 1102    PT Stop Time 1142    PT Time Calculation (min) 40 min    Activity Tolerance Patient tolerated treatment well    Behavior During Therapy [facility] for tasks assessed/performed              Past Medical History:  Diagnosis Date   Diabetes (HCC)    MS (multiple sclerosis) (HCC)    Past Surgical History:  Procedure Laterality Date   ABDOMINAL HYSTERECTOMY  2016   Patient Active Problem List   Diagnosis Date Noted   Transverse myelitis (HCC) 02/25/2022   Multiple sclerosis (HCC) 02/25/2022   Right foot drop 02/25/2022   High risk medication use 02/25/2022   Allodynia 02/25/2022    ONSET DATE: 2013  REFERRING DIAG:  G35 (ICD-10-CM) - Multiple sclerosis (HCC)  M21.371 (ICD-10-CM) - Right foot drop  R26.9 (ICD-10-CM) - Gait difficulty    THERAPY DIAG:  Other abnormalities of gait and mobility  Unsteadiness on feet  Muscle weakness (generalized)  Rationale for Evaluation and Treatment: Rehabilitation  SUBJECTIVE:                                                                                                                                                                                             SUBJECTIVE STATEMENT: Patient reports she had a good time in [place], had some knee pain but pain medication helped a lot. Patient states she has her stretch session after therapy today.   From eval: Pt reports weakness on R side. Pt states she feels that things have declined since COVID 2020 (reports she had stopped going to PT during that time). Getting in/out of a car is hard, stairs are hard. Pt does have  N/T in both feet. Pt states she does not have an AFO. Sometimes has dizziness and near falls. Pt accompanied by: self  PERTINENT HISTORY: Per H&P: was diagnosed with MS in 2013 after presenting with thoracic transverse myelitis and was found to have multiple other lesions consistent with MS and right leg spasticity/foot drop. She presented with electrical sock sensations in her back and legs followed by numbness in the right flank/abdomen and allodynia in her right leg. She also had a right foot drop  and spasticity. She also has numbness in her left foot. She was concerned about stroke and saw Dr. Corwin Levins who did an MRI of the cervical/thoracic spine. She had a large non-enhancing lesion (was 3 months after 1st symptom) at T7-T11. MRI of the brain also showed lesions and MS was diagnosed. 2 NIDDM. She had EBV and Varicella as a child   PAIN:  Are you having pain? No  PRECAUTIONS: Fall  WEIGHT BEARING RESTRICTIONS: No  FALLS: Has patient fallen in last 6 months? 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, contracting 3x/wk   PATIENT GOALS: "The main thing that brings me in is coordination, balance, strength, and flexibility."   OBJECTIVE: (Measures in this section from initial evaluation unless otherwise noted)  DIAGNOSTIC FINDINGS: Nothing in 2024  SENSATION: Bilat lateral calf into bottom of feet  MUSCLE TONE: +1 spasticity with R ankle DF  MUSCLE LENGTH: Hamstrings: Right 70 deg; Left 70 deg Thomas test: did not assess  POSTURE: weight shift left and R LE abducted, foot in pronation/ER  LOWER EXTREMITY ROM:     Active  Right Eval Left Eval  Hip flexion 95 115  Hip extension    Hip abduction    Hip adduction    Hip internal rotation     Hip external rotation    Knee flexion    Knee extension    Ankle dorsiflexion 5 15  Ankle plantarflexion    Ankle inversion    Ankle eversion     (Blank rows = not tested)  LOWER EXTREMITY MMT:    MMT Right Eval Left Eval  Hip flexion 3- 5  Hip extension 3+ 3-  Hip abduction 3+ 3  Hip adduction    Hip internal rotation 3 5  Hip external rotation 3 3+  Knee flexion 3+ 5  Knee extension 4 5  Ankle dorsiflexion    Ankle plantarflexion    Ankle inversion    Ankle eversion    (Blank rows = not tested)  TRANSFERS: Floor:  needs assessment  STAIRS: Level of Assistance: Modified independence Stair Negotiation Technique: Step to Pattern Alternating Pattern  with Bilateral Rails Number of Stairs: 10  Height of Stairs: 4-6"  Comments: Step to pattern on ascent; reciprocal pattern on descent. Needs use of bilat UE support  GAIT: Gait pattern: step through pattern, decreased stance time- Right, decreased hip/knee flexion- Right, decreased ankle dorsiflexion- Right, circumduction- Right, lateral hip instability, lateral lean- Right, and lateral lean- Left; R foot externally rotated Distance walked: 75'x4 Assistive device utilized: None Level of assistance: Complete Independence Comments: See gait pattern above  FUNCTIONAL TESTS:  5 times sit to stand: TBA (limited due to some residual knee soreness after bowling) Berg Balance Scale: 43/56 Dynamic Gait Index: 18/24   PATIENT SURVEYS:  N/a  TODAY'S TREATMENT:    [facility] Adult PT Treatment:                                                DATE: 04/06/2023 Therapeutic Exercise: Recumbent bike L3 x 6 min Therapeutic Activity: Wearing AFO: Fwd walking --> side stepping Stepping over blocks/noodle: leading with R/L --> focus  on leading with L leg  Side stepping over yoga blocks with SPC Various dance phrases with SPC as needed   [facility] Adult PT Treatment:                                                DATE:  03/30/2023 Therapeutic Exercise: Recumbent bike L3 x Supine clamshells RTB x20  S/L clamshells RTB x20 Standing squats RTB above knees x15  Therapeutic Activity: STS x 5 no HHA Fitting AFO with sneakers Fwd amb with AFO 80x3L (240') Fwd stepping over noodles & yoga blocks (various heights) & cone weaving     [facility] Adult PT Treatment:                                                DATE: 03/25/23 Therapeutic Exercise: Recumbent bike L3 x 6 min S/L hip flexion x10, eccentrics x10 red TB Supine clamshell red TB 2x10 Therapeutic Activity: Step over obstacle x10 Side step over obstacle x10 Amb fwd and step over 2 pool noodles 2x20' Side step and step over 2 pool noodles 4 x10' Forward step up 4" step 2x10 with UE and then no UE support Gait: Amb x 3 laps with AFO, cues for knee flex/ext during toe off/heel strike                                                                                                                       PATIENT EDUCATION: Education details: Exam findings, POC, initial HEP Person educated: Patient Education method: Explanation, Demonstration, and Handouts Education comprehension: verbalized understanding, returned demonstration, and needs further education  HOME EXERCISE PROGRAM: Access Code: [email or web address] URL: [email or web address] Date: 03/02/2023 Prepared by: Vernon Prey April Kirstie Peri  Exercises - Standing Hip Abduction with Counter Support  - 1 x daily - 7 x weekly - 2-3 sets - 10 reps - Standing Hip Flexion with Counter Support  - 1 x daily - 7 x weekly - 2-3 sets - 10 reps - Standing Hip Extension with Counter Support  - 1 x daily - 7 x weekly - 2-3 sets - 10 reps - Standing Heel Raise with Support  - 1 x daily - 7 x weekly - 2-3 sets - 10 reps - Seated Toe Raise  - 1 x daily - 7 x weekly - 2-3 sets - 10 reps  GOALS: Goals reviewed with patient? Yes  SHORT TERM GOALS: Target date: 03/30/2023  Pt will be ind with initial  HEP Baseline: Goal status: MET  2.  Pt will be able to perform 5x STS without UE support to demo improving functional LE strength Baseline:  Goal status: MET  3.  Pt will be able to amb with normal reciprocal gait  x 400' for home mobility Baseline:  Goal status: IN PROGRESS    LONG TERM GOALS: Target date: 04/27/2023  Pt will be ind with management and progression of HEP Baseline:  Goal status: INITIAL  2.  Pt will have improved Berg Balance Score to >/=50/56 to demo MCID Baseline:  Goal status: INITIAL  3.  Pt will have increased DGI to >/=20/24 to demo low fall risk Baseline:  Goal status: INITIAL  4.  Pt will be able to safely ascend/descend 20 stairs with reciprocal pattern and one HR for home mobility Baseline:  Goal status: INITIAL    ASSESSMENT:  CLINICAL IMPRESSION:  Session focused on functional gait and obstacle stepping with AFO; SPC provided during side stepping over blocks for additional support. Occasional circumductive gait noted during stair and block navigation; occasional cueing helped patient's awareness with body mechanics.     From eval: Patient is a 47 y.o. F who was seen today for physical therapy evaluation and treatment for MS and weakness. Pt notes ongoing decline since 2020 with near falls. Assessment significant for gross R LE weakness with resultant decrease in balance and ROM affecting gait and mobility. Pt with R foot drop and is open to use of AFO. Pt is at a high risk of falls based on Berg Balance Test and DGI. Pt would benefit from PT to address these issues to maximize her level of function.   OBJECTIVE IMPAIRMENTS: Abnormal gait, decreased activity tolerance, decreased balance, decreased coordination, decreased endurance, decreased mobility, difficulty walking, decreased ROM, decreased strength, impaired flexibility, impaired sensation, improper body mechanics, and postural dysfunction.    PLAN:  PT FREQUENCY: 2x/week  PT DURATION: 8  weeks  PLANNED INTERVENTIONS: Therapeutic exercises, Therapeutic activity, Neuromuscular re-education, Balance training, Gait training, Patient/Family education, Self Care, Joint mobilization, Stair training, Vestibular training, Dry Needling, Electrical stimulation, Cryotherapy, Moist heat, Taping, Vasopneumatic device, Ionotophoresis 4mg /ml Dexamethasone, Manual therapy, and Re-evaluation  PLAN FOR NEXT SESSION: Work on gross R LE strengthening, ROM, stability, balance. Focus primarily on hips and ankles.    Sanjuana Mae, PTA 04/06/2023, 11:48 AM

## 2023-04-08 ENCOUNTER — Ambulatory Visit: Payer: Federal, State, Local not specified - PPO

## 2023-04-08 DIAGNOSIS — M6281 Muscle weakness (generalized): Secondary | ICD-10-CM

## 2023-04-08 DIAGNOSIS — R2689 Other abnormalities of gait and mobility: Secondary | ICD-10-CM

## 2023-04-08 DIAGNOSIS — R2681 Unsteadiness on feet: Secondary | ICD-10-CM

## 2023-04-08 NOTE — Therapy (Signed)
OUTPATIENT PHYSICAL THERAPY NEURO TREATMENT   Patient Name: Karen Oconnor MRN: [id number] DOB:1975/11/20, 47 y.o., female Today's Date: 04/08/2023  PCP: Harvie Heck, MD REFERRING PROVIDER: Despina Arias, MD  END OF SESSION:  PT End of Session - 04/08/23 1100     Visit Number 11    Number of Visits 16    Date for PT Re-Evaluation 04/27/23    Authorization Type BCBS    PT Start Time 1100    PT Stop Time 1145    PT Time Calculation (min) 45 min    Activity Tolerance Patient tolerated treatment well    Behavior During Therapy WFL for tasks assessed/performed              Past Medical History:  Diagnosis Date   Diabetes (HCC)    MS (multiple sclerosis) (HCC)    Past Surgical History:  Procedure Laterality Date   ABDOMINAL HYSTERECTOMY  2016   Patient Active Problem List   Diagnosis Date Noted   Transverse myelitis (HCC) 02/25/2022   Multiple sclerosis (HCC) 02/25/2022   Right foot drop 02/25/2022   High risk medication use 02/25/2022   Allodynia 02/25/2022    ONSET DATE: 2013  REFERRING DIAG:  G35 (ICD-10-CM) - Multiple sclerosis (HCC)  M21.371 (ICD-10-CM) - Right foot drop  R26.9 (ICD-10-CM) - Gait difficulty    THERAPY DIAG:  Other abnormalities of gait and mobility  Unsteadiness on feet  Muscle weakness (generalized)  Rationale for Evaluation and Treatment: Rehabilitation  SUBJECTIVE:                                                                                                                                                                                             SUBJECTIVE STATEMENT: Patient reports she is taking a break from her dance class due to upcoming travels.   From eval: Pt reports weakness on R side. Pt states she feels that things have declined since COVID 2020 (reports she had stopped going to PT during that time). Getting in/out of a car is hard, stairs are hard. Pt does have N/T in both feet. Pt states she does not have an  AFO. Sometimes has dizziness and near falls. Pt accompanied by: self  PERTINENT HISTORY: Per H&P: was diagnosed with MS in 2013 after presenting with thoracic transverse myelitis and was found to have multiple other lesions consistent with MS and right leg spasticity/foot drop. She presented with electrical sock sensations in her back and legs followed by numbness in the right flank/abdomen and allodynia in her right leg. She also had a right foot drop and spasticity. She also has numbness in her  left foot. She was concerned about stroke and saw Dr. Corwin Levins who did an MRI of the cervical/thoracic spine. She had a large non-enhancing lesion (was 3 months after 1st symptom) at T7-T11. MRI of the brain also showed lesions and MS was diagnosed. 2 NIDDM. She had EBV and Varicella as a child   PAIN:  Are you having pain? No  PRECAUTIONS: Fall  WEIGHT BEARING RESTRICTIONS: No  FALLS: Has patient fallen in last 6 months? 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, contracting 3x/wk   PATIENT GOALS: "The main thing that brings me in is coordination, balance, strength, and flexibility."   OBJECTIVE: (Measures in this section from initial evaluation unless otherwise noted)  DIAGNOSTIC FINDINGS: Nothing in 2024  SENSATION: Bilat lateral calf into bottom of feet  MUSCLE TONE: +1 spasticity with R ankle DF  MUSCLE LENGTH: Hamstrings: Right 70 deg; Left 70 deg Thomas test: did not assess  POSTURE: weight shift left and R LE abducted, foot in pronation/ER  LOWER EXTREMITY ROM:     Active  Right Eval Left Eval  Hip flexion 95 115  Hip extension    Hip abduction    Hip adduction    Hip internal rotation    Hip external rotation    Knee flexion    Knee  extension    Ankle dorsiflexion 5 15  Ankle plantarflexion    Ankle inversion    Ankle eversion     (Blank rows = not tested)  LOWER EXTREMITY MMT:    MMT Right Eval Left Eval  Hip flexion 3- 5  Hip extension 3+ 3-  Hip abduction 3+ 3  Hip adduction    Hip internal rotation 3 5  Hip external rotation 3 3+  Knee flexion 3+ 5  Knee extension 4 5  Ankle dorsiflexion    Ankle plantarflexion    Ankle inversion    Ankle eversion    (Blank rows = not tested)  TRANSFERS: Floor:  needs assessment  STAIRS: Level of Assistance: Modified independence Stair Negotiation Technique: Step to Pattern Alternating Pattern  with Bilateral Rails Number of Stairs: 10  Height of Stairs: 4-6"  Comments: Step to pattern on ascent; reciprocal pattern on descent. Needs use of bilat UE support  GAIT: Gait pattern: step through pattern, decreased stance time- Right, decreased hip/knee flexion- Right, decreased ankle dorsiflexion- Right, circumduction- Right, lateral hip instability, lateral lean- Right, and lateral lean- Left; R foot externally rotated Distance walked: 75'x4 Assistive device utilized: None Level of assistance: Complete Independence Comments: See gait pattern above  FUNCTIONAL TESTS:  5 times sit to stand: TBA (limited due to some residual knee soreness after bowling) Berg Balance Scale: 43/56 Dynamic Gait Index: 18/24   PATIENT SURVEYS:  N/a  TODAY'S TREATMENT:   [facility] Adult PT Treatment:                                                DATE: 04/08/2023 Therapeutic Exercise: Recumbent bike L3 x 5 min Mat Table: S/L clamshells RTB 2x10 --> reverse clamshells YTB 2x10 Supine bridges: ball squeeze x10, clamshell x12, foot propped on block x10 each Therapeutic Activity:  Forward weight shifting in staggered stance (R back) Straight leg raises M/L over block Slow STS w/left foot on yoga block Side marching --> focus on hip flexion      [facility] Adult PT Treatment:                                                 DATE: 04/06/2023 Therapeutic Exercise: Recumbent bike L3 x 6 min Therapeutic Activity: Wearing AFO: Fwd walking --> side stepping Stepping over blocks/noodle: leading with R/L --> focus on leading with L leg  Side stepping over yoga blocks with SPC Various dance phrases with SPC as needed   [facility] Adult PT Treatment:                                                DATE: 03/30/2023 Therapeutic Exercise: Recumbent bike L3 x Supine clamshells RTB x20  S/L clamshells RTB x20 Standing squats RTB above knees x15  Therapeutic Activity: STS x 5 no HHA Fitting AFO with sneakers Fwd amb with AFO 80x3L (240') Fwd stepping over noodles & yoga blocks (various heights) & cone weaving                                                                                                                       PATIENT EDUCATION: Education details: Exam findings, POC, initial HEP Person educated: Patient Education method: Explanation, Demonstration, and Handouts Education comprehension: verbalized understanding, returned demonstration, and needs further education  HOME EXERCISE PROGRAM: Access Code: [email or web address] URL: [email or web address] Date: 03/02/2023 Prepared by: Vernon Prey April Kirstie Peri  Exercises - Standing Hip Abduction with Counter Support  - 1 x daily - 7 x weekly - 2-3 sets - 10 reps - Standing Hip Flexion with Counter Support  - 1 x daily - 7 x weekly - 2-3 sets - 10 reps - Standing Hip Extension with Counter Support  - 1 x daily - 7 x weekly - 2-3 sets - 10 reps - Standing Heel Raise with Support  - 1 x daily - 7 x weekly - 2-3 sets - 10 reps - Seated Toe Raise  - 1 x daily - 7 x weekly - 2-3 sets - 10 reps  GOALS: Goals reviewed with patient? Yes  SHORT TERM GOALS: Target date: 03/30/2023  Pt will be ind with initial HEP Baseline: Goal status: MET  2.  Pt will be able to perform 5x STS without UE support to demo improving functional  LE strength Baseline:  Goal status: MET  3.  Pt will be able to amb with normal reciprocal gait x 400' for home mobility Baseline:  Goal status: IN PROGRESS    LONG TERM GOALS: Target date: 04/27/2023  Pt will be ind  with management and progression of HEP Baseline:  Goal status: INITIAL  2.  Pt will have improved Berg Balance Score to >/=50/56 to demo MCID Baseline:  Goal status: INITIAL  3.  Pt will have increased DGI to >/=20/24 to demo low fall risk Baseline:  Goal status: INITIAL  4.  Pt will be able to safely ascend/descend 20 stairs with reciprocal pattern and one HR for home mobility Baseline:  Goal status: INITIAL    ASSESSMENT:  CLINICAL IMPRESSION:  Session focused on hip and ankle strengthening. Functional hip and knee flexion progressed with functional side marching; noted fatigue in L hip flexor towards end of activity.    From eval: Patient is a 47 y.o. F who was seen today for physical therapy evaluation and treatment for MS and weakness. Pt notes ongoing decline since 2020 with near falls. Assessment significant for gross R LE weakness with resultant decrease in balance and ROM affecting gait and mobility. Pt with R foot drop and is open to use of AFO. Pt is at a high risk of falls based on Berg Balance Test and DGI. Pt would benefit from PT to address these issues to maximize her level of function.   OBJECTIVE IMPAIRMENTS: Abnormal gait, decreased activity tolerance, decreased balance, decreased coordination, decreased endurance, decreased mobility, difficulty walking, decreased ROM, decreased strength, impaired flexibility, impaired sensation, improper body mechanics, and postural dysfunction.    PLAN:  PT FREQUENCY: 2x/week  PT DURATION: 8 weeks  PLANNED INTERVENTIONS: Therapeutic exercises, Therapeutic activity, Neuromuscular re-education, Balance training, Gait training, Patient/Family education, Self Care, Joint mobilization, Stair training,  Vestibular training, Dry Needling, Electrical stimulation, Cryotherapy, Moist heat, Taping, Vasopneumatic device, Ionotophoresis 4mg /ml Dexamethasone, Manual therapy, and Re-evaluation  PLAN FOR NEXT SESSION: Progress gross R LE strengthening, ROM, stability, balance. Focus primarily on hips and ankles.    Sanjuana Mae, PTA 04/08/2023, 11:50 AM

## 2023-04-13 ENCOUNTER — Ambulatory Visit: Payer: Federal, State, Local not specified - PPO | Admitting: Physical Therapy

## 2023-04-13 ENCOUNTER — Encounter: Payer: Self-pay | Admitting: Physical Therapy

## 2023-04-13 DIAGNOSIS — M6281 Muscle weakness (generalized): Secondary | ICD-10-CM

## 2023-04-13 DIAGNOSIS — R2689 Other abnormalities of gait and mobility: Secondary | ICD-10-CM

## 2023-04-13 DIAGNOSIS — R2681 Unsteadiness on feet: Secondary | ICD-10-CM

## 2023-04-13 NOTE — Therapy (Signed)
OUTPATIENT PHYSICAL THERAPY NEURO TREATMENT   Patient Name: Money Madill MRN: [id number] DOB:22-Dec-1975, 47 y.o., female Today's Date: 04/13/2023  PCP: Harvie Heck, MD REFERRING PROVIDER: Despina Arias, MD  END OF SESSION:  PT End of Session - 04/13/23 1059     Visit Number 12    Number of Visits 16    Date for PT Re-Evaluation 04/27/23    Authorization Type BCBS    PT Start Time 1100    PT Stop Time 1145    PT Time Calculation (min) 45 min    Activity Tolerance Patient tolerated treatment well    Behavior During Therapy [facility] for tasks assessed/performed              Past Medical History:  Diagnosis Date   Diabetes (HCC)    MS (multiple sclerosis) (HCC)    Past Surgical History:  Procedure Laterality Date   ABDOMINAL HYSTERECTOMY  2016   Patient Active Problem List   Diagnosis Date Noted   Transverse myelitis (HCC) 02/25/2022   Multiple sclerosis (HCC) 02/25/2022   Right foot drop 02/25/2022   High risk medication use 02/25/2022   Allodynia 02/25/2022    ONSET DATE: 2013  REFERRING DIAG:  G35 (ICD-10-CM) - Multiple sclerosis (HCC)  M21.371 (ICD-10-CM) - Right foot drop  R26.9 (ICD-10-CM) - Gait difficulty    THERAPY DIAG:  Other abnormalities of gait and mobility  Unsteadiness on feet  Muscle weakness (generalized)  Rationale for Evaluation and Treatment: Rehabilitation  SUBJECTIVE:                                                                                                                                                                                             SUBJECTIVE STATEMENT: Pt states she's been fine. Nothing new or different to report.   From eval: Pt reports weakness on R side. Pt states she feels that things have declined since COVID 2020 (reports she had stopped going to PT during that time). Getting in/out of a car is hard, stairs are hard. Pt does have N/T in both feet. Pt states she does not have an AFO. Sometimes has  dizziness and near falls. Pt accompanied by: self  PERTINENT HISTORY: Per H&P: was diagnosed with MS in 2013 after presenting with thoracic transverse myelitis and was found to have multiple other lesions consistent with MS and right leg spasticity/foot drop. She presented with electrical sock sensations in her back and legs followed by numbness in the right flank/abdomen and allodynia in her right leg. She also had a right foot drop and spasticity. She also has numbness in her left foot. She was  concerned about stroke and saw Dr. Corwin Levins who did an MRI of the cervical/thoracic spine. She had a large non-enhancing lesion (was 3 months after 1st symptom) at T7-T11. MRI of the brain also showed lesions and MS was diagnosed. 2 NIDDM. She had EBV and Varicella as a child   PAIN:  Are you having pain? No  PRECAUTIONS: Fall  WEIGHT BEARING RESTRICTIONS: No  FALLS: Has patient fallen in last 6 months? 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, contracting 3x/wk   PATIENT GOALS: "The main thing that brings me in is coordination, balance, strength, and flexibility."   OBJECTIVE: (Measures in this section from initial evaluation unless otherwise noted)  DIAGNOSTIC FINDINGS: Nothing in 2024  SENSATION: Bilat lateral calf into bottom of feet  MUSCLE TONE: +1 spasticity with R ankle DF  MUSCLE LENGTH: Hamstrings: Right 70 deg; Left 70 deg Thomas test: did not assess  POSTURE: weight shift left and R LE abducted, foot in pronation/ER  LOWER EXTREMITY ROM:     Active  Right Eval Left Eval  Hip flexion 95 115  Hip extension    Hip abduction    Hip adduction    Hip internal rotation    Hip external rotation    Knee flexion    Knee extension     Ankle dorsiflexion 5 15  Ankle plantarflexion    Ankle inversion    Ankle eversion     (Blank rows = not tested)  LOWER EXTREMITY MMT:    MMT Right Eval Left Eval  Hip flexion 3- 5  Hip extension 3+ 3-  Hip abduction 3+ 3  Hip adduction    Hip internal rotation 3 5  Hip external rotation 3 3+  Knee flexion 3+ 5  Knee extension 4 5  Ankle dorsiflexion    Ankle plantarflexion    Ankle inversion    Ankle eversion    (Blank rows = not tested)  TRANSFERS: Floor:  needs assessment  STAIRS: Level of Assistance: Modified independence Stair Negotiation Technique: Step to Pattern Alternating Pattern  with Bilateral Rails Number of Stairs: 10  Height of Stairs: 4-6"  Comments: Step to pattern on ascent; reciprocal pattern on descent. Needs use of bilat UE support  GAIT: Gait pattern: step through pattern, decreased stance time- Right, decreased hip/knee flexion- Right, decreased ankle dorsiflexion- Right, circumduction- Right, lateral hip instability, lateral lean- Right, and lateral lean- Left; R foot externally rotated Distance walked: 75'x4 Assistive device utilized: None Level of assistance: Complete Independence Comments: See gait pattern above  FUNCTIONAL TESTS:  5 times sit to stand: TBA (limited due to some residual knee soreness after bowling) Berg Balance Scale: 43/56 Dynamic Gait Index: 18/24   PATIENT SURVEYS:  N/a  TODAY'S TREATMENT:  [facility] Adult PT Treatment:                                                DATE: 04/13/23 Therapeutic Exercise: Elliptical fwd 2 min, bwd 2 min Quadruped Hip ext 2x10 Fire hydrant 2x10 Primal push up 2x10 sec Tall kneeling Hip hinge into glute set 5# 2x10 Half kneeling Static balance x30 sec With front leg  hip ER against red TB x10 Leg press 90# red TB around knees x10, 115# red TB around knees 2x 10 Supine Feet on wall, bridge 3x10 sec Feet on wall, walk up x5 Foot on wall step in/out 2x10 Foot on wall knee to chest  2x10   [facility] Adult PT Treatment:                                                DATE: 04/08/2023 Therapeutic Exercise: Recumbent bike L3 x 5 min Mat Table: S/L clamshells RTB 2x10 --> reverse clamshells YTB 2x10 Supine bridges: ball squeeze x10, clamshell x12, foot propped on block x10 each Therapeutic Activity: Forward weight shifting in staggered stance (R back) Straight leg raises M/L over block Slow STS w/left foot on yoga block Side marching --> focus on hip flexion    [facility] Adult PT Treatment:                                                DATE: 04/06/2023 Therapeutic Exercise: Recumbent bike L3 x 6 min Therapeutic Activity: Wearing AFO: Fwd walking --> side stepping Stepping over blocks/noodle: leading with R/L --> focus on leading with L leg  Side stepping over yoga blocks with SPC Various dance phrases with SPC as needed                                                                                                                       PATIENT EDUCATION: Education details: Exam findings, POC, initial HEP Person educated: Patient Education method: Explanation, Demonstration, and Handouts Education comprehension: verbalized understanding, returned demonstration, and needs further education  HOME EXERCISE PROGRAM: Access Code: [email or web address] URL: [email or web address] Date: 03/02/2023 Prepared by: Vernon Prey April Kirstie Peri  Exercises - Standing Hip Abduction with Counter Support  - 1 x daily - 7 x weekly - 2-3 sets - 10 reps - Standing Hip Flexion with Counter Support  - 1 x daily - 7 x weekly - 2-3 sets - 10 reps - Standing Hip Extension with Counter Support  - 1 x daily - 7 x weekly - 2-3 sets - 10 reps - Standing Heel Raise with Support  - 1 x daily - 7 x weekly - 2-3 sets - 10 reps - Seated Toe Raise  - 1 x daily - 7 x weekly - 2-3 sets - 10 reps  GOALS: Goals reviewed with patient? Yes  SHORT TERM GOALS: Target date: 03/30/2023  Pt will be ind with  initial HEP Baseline: Goal status: MET  2.  Pt will be able to perform 5x STS without UE support to demo improving functional LE strength Baseline:  Goal status: MET  3.  Pt will be able to amb with normal  reciprocal gait x 400' for home mobility Baseline:  Goal status: IN PROGRESS    LONG TERM GOALS: Target date: 04/27/2023  Pt will be ind with management and progression of HEP Baseline:  Goal status: INITIAL  2.  Pt will have improved Berg Balance Score to >/=50/56 to demo MCID Baseline:  Goal status: INITIAL  3.  Pt will have increased DGI to >/=20/24 to demo low fall risk Baseline:  Goal status: INITIAL  4.  Pt will be able to safely ascend/descend 20 stairs with reciprocal pattern and one HR for home mobility Baseline:  Goal status: INITIAL    ASSESSMENT:  CLINICAL IMPRESSION: Continued progressing R LE strengthening. Focused primarily on R hip stability and coordination. Pt tolerated well.   From eval: Patient is a 47 y.o. F who was seen today for physical therapy evaluation and treatment for MS and weakness. Pt notes ongoing decline since 2020 with near falls. Assessment significant for gross R LE weakness with resultant decrease in balance and ROM affecting gait and mobility. Pt with R foot drop and is open to use of AFO. Pt is at a high risk of falls based on Berg Balance Test and DGI. Pt would benefit from PT to address these issues to maximize her level of function.   OBJECTIVE IMPAIRMENTS: Abnormal gait, decreased activity tolerance, decreased balance, decreased coordination, decreased endurance, decreased mobility, difficulty walking, decreased ROM, decreased strength, impaired flexibility, impaired sensation, improper body mechanics, and postural dysfunction.    PLAN:  PT FREQUENCY: 2x/week  PT DURATION: 8 weeks  PLANNED INTERVENTIONS: Therapeutic exercises, Therapeutic activity, Neuromuscular re-education, Balance training, Gait training, Patient/Family  education, Self Care, Joint mobilization, Stair training, Vestibular training, Dry Needling, Electrical stimulation, Cryotherapy, Moist heat, Taping, Vasopneumatic device, Ionotophoresis 4mg /ml Dexamethasone, Manual therapy, and Re-evaluation  PLAN FOR NEXT SESSION: Progress gross R LE strengthening, ROM, stability, balance. Focus primarily on hips and ankles.    Mata Rowen April Ma L Kacy Conely, PT 04/13/2023, 11:48 AM

## 2023-04-15 ENCOUNTER — Ambulatory Visit: Payer: Federal, State, Local not specified - PPO

## 2023-04-15 DIAGNOSIS — R2689 Other abnormalities of gait and mobility: Secondary | ICD-10-CM | POA: Diagnosis not present

## 2023-04-15 DIAGNOSIS — R2681 Unsteadiness on feet: Secondary | ICD-10-CM

## 2023-04-15 DIAGNOSIS — M6281 Muscle weakness (generalized): Secondary | ICD-10-CM

## 2023-04-15 NOTE — Therapy (Signed)
OUTPATIENT PHYSICAL THERAPY NEURO TREATMENT   Patient Name: Daysha Elwart MRN: [id number] DOB:16-Jun-1976, 47 y.o., female Today's Date: 04/15/2023  PCP: Harvie Heck, MD REFERRING PROVIDER: Despina Arias, MD  END OF SESSION:  PT End of Session - 04/15/23 1101     Visit Number 13    Number of Visits 16    Date for PT Re-Evaluation 04/27/23    Authorization Type BCBS    PT Start Time 1102    PT Stop Time 1143    PT Time Calculation (min) 41 min    Activity Tolerance Patient tolerated treatment well    Behavior During Therapy WFL for tasks assessed/performed              Past Medical History:  Diagnosis Date   Diabetes (HCC)    MS (multiple sclerosis) (HCC)    Past Surgical History:  Procedure Laterality Date   ABDOMINAL HYSTERECTOMY  2016   Patient Active Problem List   Diagnosis Date Noted   Transverse myelitis (HCC) 02/25/2022   Multiple sclerosis (HCC) 02/25/2022   Right foot drop 02/25/2022   High risk medication use 02/25/2022   Allodynia 02/25/2022    ONSET DATE: 2013  REFERRING DIAG:  G35 (ICD-10-CM) - Multiple sclerosis (HCC)  M21.371 (ICD-10-CM) - Right foot drop  R26.9 (ICD-10-CM) - Gait difficulty    THERAPY DIAG:  Other abnormalities of gait and mobility  Unsteadiness on feet  Muscle weakness (generalized)  Rationale for Evaluation and Treatment: Rehabilitation  SUBJECTIVE:                                                                                                                                                                                             SUBJECTIVE STATEMENT: Patient reports her thighs are sore but her knees feel much better, states she was able to go up/down stairs with no pain in knees.   From eval: Pt reports weakness on R side. Pt states she feels that things have declined since COVID 2020 (reports she had stopped going to PT during that time). Getting in/out of a car is hard, stairs are hard. Pt does have N/T  in both feet. Pt states she does not have an AFO. Sometimes has dizziness and near falls. Pt accompanied by: self  PERTINENT HISTORY: Per H&P: was diagnosed with MS in 2013 after presenting with thoracic transverse myelitis and was found to have multiple other lesions consistent with MS and right leg spasticity/foot drop. She presented with electrical sock sensations in her back and legs followed by numbness in the right flank/abdomen and allodynia in her right leg. She also had a right  foot drop and spasticity. She also has numbness in her left foot. She was concerned about stroke and saw Dr. Corwin Levins who did an MRI of the cervical/thoracic spine. She had a large non-enhancing lesion (was 3 months after 1st symptom) at T7-T11. MRI of the brain also showed lesions and MS was diagnosed. 2 NIDDM. She had EBV and Varicella as a child   PAIN:  Are you having pain? No  PRECAUTIONS: Fall  WEIGHT BEARING RESTRICTIONS: No  FALLS: Has patient fallen in last 6 months? 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, contracting 3x/wk   PATIENT GOALS: "The main thing that brings me in is coordination, balance, strength, and flexibility."   OBJECTIVE: (Measures in this section from initial evaluation unless otherwise noted)  DIAGNOSTIC FINDINGS: Nothing in 2024  SENSATION: Bilat lateral calf into bottom of feet  MUSCLE TONE: +1 spasticity with R ankle DF  MUSCLE LENGTH: Hamstrings: Right 70 deg; Left 70 deg Thomas test: did not assess  POSTURE: weight shift left and R LE abducted, foot in pronation/ER  LOWER EXTREMITY ROM:     Active  Right Eval Left Eval  Hip flexion 95 115  Hip extension    Hip abduction    Hip adduction    Hip internal rotation     Hip external rotation    Knee flexion    Knee extension    Ankle dorsiflexion 5 15  Ankle plantarflexion    Ankle inversion    Ankle eversion     (Blank rows = not tested)  LOWER EXTREMITY MMT:    MMT Right Eval Left Eval  Hip flexion 3- 5  Hip extension 3+ 3-  Hip abduction 3+ 3  Hip adduction    Hip internal rotation 3 5  Hip external rotation 3 3+  Knee flexion 3+ 5  Knee extension 4 5  Ankle dorsiflexion    Ankle plantarflexion    Ankle inversion    Ankle eversion    (Blank rows = not tested)  TRANSFERS: Floor:  needs assessment  STAIRS: Level of Assistance: Modified independence Stair Negotiation Technique: Step to Pattern Alternating Pattern  with Bilateral Rails Number of Stairs: 10  Height of Stairs: 4-6"  Comments: Step to pattern on ascent; reciprocal pattern on descent. Needs use of bilat UE support  GAIT: Gait pattern: step through pattern, decreased stance time- Right, decreased hip/knee flexion- Right, decreased ankle dorsiflexion- Right, circumduction- Right, lateral hip instability, lateral lean- Right, and lateral lean- Left; R foot externally rotated Distance walked: 75'x4 Assistive device utilized: None Level of assistance: Complete Independence Comments: See gait pattern above  FUNCTIONAL TESTS:  5 times sit to stand: TBA (limited due to some residual knee soreness after bowling) Berg Balance Scale: 43/56 Dynamic Gait Index: 18/24   PATIENT SURVEYS:  N/a  TODAY'S TREATMENT:  [facility] Adult PT Treatment:                                                DATE: 04/15/2023 Therapeutic Exercise: Elliptical fwd 2 min, bwd 2 min Quadruped: Hip ext 2x15 Fire hydrant 2x12 Primal push up 3x15" (therapist assist w/R foot)  Tall kneeling hip hinge into glute set 5# 2x12 Half kneeling: Static balance x30" With front leg hip ER against red TB x10 Leg press (RTB around knees) 120# x15 --> 140# x15, x10    [facility] Adult PT Treatment:                                                 DATE: 04/13/23 Therapeutic Exercise: Elliptical fwd 2 min, bwd 2 min Quadruped Hip ext 2x10 Fire hydrant 2x10 Primal push up 2x10" Tall kneeling hip hinge into glute set 5# 2x10 Half kneeling Static balance x30 sec With front leg hip ER against red TB x10 Leg press 90# red TB around knees x10, 115# red TB around knees 2x 10 Supine Feet on wall, bridge 3x10 sec Feet on wall, walk up x5 Foot on wall step in/out 2x10 Foot on wall knee to chest 2x10   [facility] Adult PT Treatment:                                                DATE: 04/08/2023 Therapeutic Exercise: Recumbent bike L3 x 5 min Mat Table: S/L clamshells RTB 2x10 --> reverse clamshells YTB 2x10 Supine bridges: ball squeeze x10, clamshell x12, foot propped on block x10 each Therapeutic Activity: Forward weight shifting in staggered stance (R back) Straight leg raises M/L over block Slow STS w/left foot on yoga block Side marching --> focus on hip flexion                                                                                                                       PATIENT EDUCATION: Education details: Exam findings, POC, initial HEP Person educated: Patient Education method: Explanation, Demonstration, and Handouts Education comprehension: verbalized understanding, returned demonstration, and needs further education  HOME EXERCISE PROGRAM: Access Code: [email or web address] URL: [email or web address] Date: 03/02/2023 Prepared by: Vernon Prey April Kirstie Peri  Exercises - Standing Hip Abduction with Counter Support  - 1 x daily - 7 x weekly - 2-3 sets - 10 reps - Standing Hip Flexion with Counter Support  - 1 x daily - 7 x weekly - 2-3 sets - 10 reps - Standing Hip Extension with Counter Support  - 1 x daily - 7 x weekly - 2-3 sets - 10 reps - Standing Heel Raise with Support  - 1 x daily - 7 x weekly - 2-3 sets - 10 reps - Seated Toe Raise  - 1 x daily - 7 x weekly - 2-3 sets - 10  reps  GOALS: Goals reviewed with patient? Yes  SHORT TERM GOALS: Target date: 03/30/2023  Pt will be ind with initial HEP Baseline: Goal status: MET  2.  Pt will be able to perform  5x STS without UE support to demo improving functional LE strength Baseline:  Goal status: MET  3.  Pt will be able to amb with normal reciprocal gait x 400' for home mobility Baseline:  Goal status: IN PROGRESS    LONG TERM GOALS: Target date: 04/27/2023  Pt will be ind with management and progression of HEP Baseline:  Goal status: INITIAL  2.  Pt will have improved Berg Balance Score to >/=50/56 to demo MCID Baseline:  Goal status: INITIAL  3.  Pt will have increased DGI to >/=20/24 to demo low fall risk Baseline:  Goal status: INITIAL  4.  Pt will be able to safely ascend/descend 20 stairs with reciprocal pattern and one HR for home mobility Baseline:  Goal status: INITIAL    ASSESSMENT:  CLINICAL IMPRESSION: Continued LE strengthening exercises focusing on dynamic glute activation for postural support. Occasional cueing provided to improve core stability and postural support in various positions as session progressed.   From eval: Patient is a 47 y.o. F who was seen today for physical therapy evaluation and treatment for MS and weakness. Pt notes ongoing decline since 2020 with near falls. Assessment significant for gross R LE weakness with resultant decrease in balance and ROM affecting gait and mobility. Pt with R foot drop and is open to use of AFO. Pt is at a high risk of falls based on Berg Balance Test and DGI. Pt would benefit from PT to address these issues to maximize her level of function.   OBJECTIVE IMPAIRMENTS: Abnormal gait, decreased activity tolerance, decreased balance, decreased coordination, decreased endurance, decreased mobility, difficulty walking, decreased ROM, decreased strength, impaired flexibility, impaired sensation, improper body mechanics, and postural  dysfunction.    PLAN:  PT FREQUENCY: 2x/week  PT DURATION: 8 weeks  PLANNED INTERVENTIONS: Therapeutic exercises, Therapeutic activity, Neuromuscular re-education, Balance training, Gait training, Patient/Family education, Self Care, Joint mobilization, Stair training, Vestibular training, Dry Needling, Electrical stimulation, Cryotherapy, Moist heat, Taping, Vasopneumatic device, Ionotophoresis 4mg /ml Dexamethasone, Manual therapy, and Re-evaluation  PLAN FOR NEXT SESSION: Elliptical warm-up; continue quadruped/kneeling exercises. Progress gross R LE strengthening, ROM, stability, balance. Focus primarily on hips and ankles.    Sanjuana Mae, PTA 04/15/2023, 11:44 AM

## 2023-04-20 ENCOUNTER — Encounter: Payer: Federal, State, Local not specified - PPO | Admitting: Physical Therapy

## 2023-04-22 ENCOUNTER — Encounter: Payer: Federal, State, Local not specified - PPO | Admitting: Physical Therapy

## 2023-04-26 ENCOUNTER — Ambulatory Visit: Payer: Federal, State, Local not specified - PPO | Attending: Neurology

## 2023-04-26 DIAGNOSIS — M6281 Muscle weakness (generalized): Secondary | ICD-10-CM | POA: Diagnosis present

## 2023-04-26 DIAGNOSIS — R2681 Unsteadiness on feet: Secondary | ICD-10-CM | POA: Insufficient documentation

## 2023-04-26 DIAGNOSIS — R2689 Other abnormalities of gait and mobility: Secondary | ICD-10-CM | POA: Diagnosis present

## 2023-04-26 NOTE — Therapy (Signed)
OUTPATIENT PHYSICAL THERAPY NEURO TREATMENT   Patient Name: Karen Oconnor MRN: [id number] DOB:08/09/1976, 47 y.o., female Today's Date: 04/26/2023  PCP: Harvie Heck, MD REFERRING PROVIDER: Despina Arias, MD  END OF SESSION:  PT End of Session - 04/26/23 1107     Visit Number 14    Number of Visits 16    Date for PT Re-Evaluation 04/27/23    Authorization Type BCBS    PT Start Time 1108    PT Stop Time 1147    PT Time Calculation (min) 39 min    Activity Tolerance Patient tolerated treatment well    Behavior During Therapy WFL for tasks assessed/performed              Past Medical History:  Diagnosis Date   Diabetes (HCC)    MS (multiple sclerosis) (HCC)    Past Surgical History:  Procedure Laterality Date   ABDOMINAL HYSTERECTOMY  2016   Patient Active Problem List   Diagnosis Date Noted   Transverse myelitis (HCC) 02/25/2022   Multiple sclerosis (HCC) 02/25/2022   Right foot drop 02/25/2022   High risk medication use 02/25/2022   Allodynia 02/25/2022    ONSET DATE: 2013  REFERRING DIAG:  G35 (ICD-10-CM) - Multiple sclerosis (HCC)  M21.371 (ICD-10-CM) - Right foot drop  R26.9 (ICD-10-CM) - Gait difficulty    THERAPY DIAG:  Other abnormalities of gait and mobility  Unsteadiness on feet  Muscle weakness (generalized)  Rationale for Evaluation and Treatment: Rehabilitation  SUBJECTIVE:                                                                                                                                                                                             SUBJECTIVE STATEMENT: Patient reports she is feeling tired today.  From eval: Pt reports weakness on R side. Pt states she feels that things have declined since COVID 2020 (reports she had stopped going to PT during that time). Getting in/out of a car is hard, stairs are hard. Pt does have N/T in both feet. Pt states she does not have an AFO. Sometimes has dizziness and near  falls. Pt accompanied by: self  PERTINENT HISTORY: Per H&P: was diagnosed with MS in 2013 after presenting with thoracic transverse myelitis and was found to have multiple other lesions consistent with MS and right leg spasticity/foot drop. She presented with electrical sock sensations in her back and legs followed by numbness in the right flank/abdomen and allodynia in her right leg. She also had a right foot drop and spasticity. She also has numbness in her left foot. She was concerned about stroke and saw  Dr. Corwin Levins who did an MRI of the cervical/thoracic spine. She had a large non-enhancing lesion (was 3 months after 1st symptom) at T7-T11. MRI of the brain also showed lesions and MS was diagnosed. 2 NIDDM. She had EBV and Varicella as a child   PAIN:  Are you having pain? No  PRECAUTIONS: Fall  WEIGHT BEARING RESTRICTIONS: No  FALLS: Has patient fallen in last 6 months? 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, contracting 3x/wk   PATIENT GOALS: "The main thing that brings me in is coordination, balance, strength, and flexibility."   OBJECTIVE: (Measures in this section from initial evaluation unless otherwise noted)  DIAGNOSTIC FINDINGS: Nothing in 2024  SENSATION: Bilat lateral calf into bottom of feet  MUSCLE TONE: +1 spasticity with R ankle DF  MUSCLE LENGTH: Hamstrings: Right 70 deg; Left 70 deg Thomas test: did not assess  POSTURE: weight shift left and R LE abducted, foot in pronation/ER  LOWER EXTREMITY ROM:     Active  Right Eval Left Eval  Hip flexion 95 115  Hip extension    Hip abduction    Hip adduction    Hip internal rotation    Hip external rotation    Knee flexion    Knee extension    Ankle dorsiflexion 5 15   Ankle plantarflexion    Ankle inversion    Ankle eversion     (Blank rows = not tested)  LOWER EXTREMITY MMT:    MMT Right Eval Left Eval  Hip flexion 3- 5  Hip extension 3+ 3-  Hip abduction 3+ 3  Hip adduction    Hip internal rotation 3 5  Hip external rotation 3 3+  Knee flexion 3+ 5  Knee extension 4 5  Ankle dorsiflexion    Ankle plantarflexion    Ankle inversion    Ankle eversion    (Blank rows = not tested)  TRANSFERS: Floor:  needs assessment  STAIRS: Level of Assistance: Modified independence Stair Negotiation Technique: Step to Pattern Alternating Pattern  with Bilateral Rails Number of Stairs: 10  Height of Stairs: 4-6"  Comments: Step to pattern on ascent; reciprocal pattern on descent. Needs use of bilat UE support  GAIT: Gait pattern: step through pattern, decreased stance time- Right, decreased hip/knee flexion- Right, decreased ankle dorsiflexion- Right, circumduction- Right, lateral hip instability, lateral lean- Right, and lateral lean- Left; R foot externally rotated Distance walked: 75'x4 Assistive device utilized: None Level of assistance: Complete Independence Comments: See gait pattern above  FUNCTIONAL TESTS:  5 times sit to stand: TBA (limited due to some residual knee soreness after bowling) Berg Balance Scale: 43/56 Dynamic Gait Index: 18/24   PATIENT SURVEYS:  N/a  TODAY'S TREATMENT:  [facility] Adult PT Treatment:                                                DATE: 04/26/2023 Therapeutic Exercise: Recumbent bike L 3 x 5 min Supine: Feet on wall, bridge + clamshell GTB 2x10x3" Foot on wall knee to chest 2x10 Quadruped: Fire hydrants 2x10 B Hip extension x10 B Primal push up 3x10"    [facility] Adult PT  Treatment:                                                DATE: 04/15/2023 Therapeutic Exercise: Elliptical fwd 2 min, bwd 2 min Quadruped: Hip ext 2x15 Fire hydrant 2x12 Primal push up 3x15" (therapist assist w/R foot)  Tall  kneeling hip hinge into glute set 5# 2x12 Half kneeling: Static balance x30" With front leg hip ER against red TB x10 Leg press (RTB around knees) 120# x15 --> 140# x15, x10    [facility] Adult PT Treatment:                                                DATE: 04/13/23 Therapeutic Exercise: Elliptical fwd 2 min, bwd 2 min Quadruped Hip ext 2x10 Fire hydrant 2x10 Primal push up 2x10" Tall kneeling hip hinge into glute set 5# 2x10 Half kneeling Static balance x30 sec With front leg hip ER against red TB x10 Leg press 90# red TB around knees x10, 115# red TB around knees 2x 10 Supine Feet on wall, bridge 3x10 sec Feet on wall, walk up x5 Foot on wall step in/out 2x10 Foot on wall knee to chest 2x10                                                                                                                       PATIENT EDUCATION: Education details: Exam findings, POC, initial HEP Person educated: Patient Education method: Explanation, Demonstration, and Handouts Education comprehension: verbalized understanding, returned demonstration, and needs further education  HOME EXERCISE PROGRAM: Access Code: [email or web address] URL: [email or web address] Date: 03/02/2023 Prepared by: Vernon Prey April Kirstie Peri  Exercises - Standing Hip Abduction with Counter Support  - 1 x daily - 7 x weekly - 2-3 sets - 10 reps - Standing Hip Flexion with Counter Support  - 1 x daily - 7 x weekly - 2-3 sets - 10 reps - Standing Hip Extension with Counter Support  - 1 x daily - 7 x weekly - 2-3 sets - 10 reps - Standing Heel Raise with Support  - 1 x daily - 7 x weekly - 2-3 sets - 10 reps - Seated Toe Raise  - 1 x daily - 7 x weekly - 2-3 sets - 10 reps  GOALS: Goals reviewed with patient? Yes  SHORT TERM GOALS: Target date: 03/30/2023  Pt will be ind with initial HEP Baseline: Goal status: MET  2.  Pt will be able to perform 5x STS without UE support to demo improving functional LE  strength Baseline:  Goal status: MET  3.  Pt will be able to amb with normal reciprocal gait x 400' for home mobility Baseline:  Goal status: IN PROGRESS  LONG TERM GOALS: Target date: 04/27/2023  Pt will be ind with management and progression of HEP Baseline:  Goal status: INITIAL  2.  Pt will have improved Berg Balance Score to >/=50/56 to demo MCID Baseline:  Goal status: INITIAL  3.  Pt will have increased DGI to >/=20/24 to demo low fall risk Baseline:  Goal status: INITIAL  4.  Pt will be able to safely ascend/descend 20 stairs with reciprocal pattern and one HR for home mobility Baseline:  Goal status: INITIAL    ASSESSMENT:  CLINICAL IMPRESSION: Patient fatigued today from dance conference and more physical activity than usual over the weekend. Glute and hip strengthening continued with bridge variations in supine and quadruped exercises.   From eval: Patient is a 47 y.o. F who was seen today for physical therapy evaluation and treatment for MS and weakness. Pt notes ongoing decline since 2020 with near falls. Assessment significant for gross R LE weakness with resultant decrease in balance and ROM affecting gait and mobility. Pt with R foot drop and is open to use of AFO. Pt is at a high risk of falls based on Berg Balance Test and DGI. Pt would benefit from PT to address these issues to maximize her level of function.   OBJECTIVE IMPAIRMENTS: Abnormal gait, decreased activity tolerance, decreased balance, decreased coordination, decreased endurance, decreased mobility, difficulty walking, decreased ROM, decreased strength, impaired flexibility, impaired sensation, improper body mechanics, and postural dysfunction.    PLAN:  PT FREQUENCY: 2x/week  PT DURATION: 8 weeks  PLANNED INTERVENTIONS: Therapeutic exercises, Therapeutic activity, Neuromuscular re-education, Balance training, Gait training, Patient/Family education, Self Care, Joint mobilization, Stair  training, Vestibular training, Dry Needling, Electrical stimulation, Cryotherapy, Moist heat, Taping, Vasopneumatic device, Ionotophoresis 4mg /ml Dexamethasone, Manual therapy, and Re-evaluation  PLAN FOR NEXT SESSION: LTG review next visit. Continue quadruped/kneeling exercises. Progress gross R LE strengthening, ROM, stability, balance. Focus primarily on hips and ankles.    Sanjuana Mae, PTA 04/26/2023, 11:49 AM

## 2023-04-28 ENCOUNTER — Ambulatory Visit: Payer: Federal, State, Local not specified - PPO | Admitting: Physical Therapy

## 2023-04-28 ENCOUNTER — Encounter: Payer: Self-pay | Admitting: Physical Therapy

## 2023-04-28 DIAGNOSIS — R2681 Unsteadiness on feet: Secondary | ICD-10-CM

## 2023-04-28 DIAGNOSIS — R2689 Other abnormalities of gait and mobility: Secondary | ICD-10-CM | POA: Diagnosis not present

## 2023-04-28 DIAGNOSIS — M6281 Muscle weakness (generalized): Secondary | ICD-10-CM

## 2023-04-28 NOTE — Therapy (Signed)
OUTPATIENT PHYSICAL THERAPY NEURO TREATMENT   Patient Name: Karen Oconnor MRN: [id number] DOB:1976-02-24, 47 y.o., female Today's Date: 04/28/2023  PCP: Harvie Heck, MD REFERRING PROVIDER: Despina Arias, MD  END OF SESSION:  PT End of Session - 04/28/23 1108     Visit Number 15    Number of Visits 16    Date for PT Re-Evaluation 04/27/23    Authorization Type BCBS    PT Start Time 1105    PT Stop Time 1145    PT Time Calculation (min) 40 min    Activity Tolerance Patient tolerated treatment well    Behavior During Therapy WFL for tasks assessed/performed              Past Medical History:  Diagnosis Date   Diabetes (HCC)    MS (multiple sclerosis) (HCC)    Past Surgical History:  Procedure Laterality Date   ABDOMINAL HYSTERECTOMY  2016   Patient Active Problem List   Diagnosis Date Noted   Transverse myelitis (HCC) 02/25/2022   Multiple sclerosis (HCC) 02/25/2022   Right foot drop 02/25/2022   High risk medication use 02/25/2022   Allodynia 02/25/2022    ONSET DATE: 2013  REFERRING DIAG:  G35 (ICD-10-CM) - Multiple sclerosis (HCC)  M21.371 (ICD-10-CM) - Right foot drop  R26.9 (ICD-10-CM) - Gait difficulty    THERAPY DIAG:  Other abnormalities of gait and mobility  Unsteadiness on feet  Muscle weakness (generalized)  Rationale for Evaluation and Treatment: Rehabilitation  SUBJECTIVE:                                                                                                                                                                                             SUBJECTIVE STATEMENT: Pt states she's doing okay this morning.   From eval: Pt reports weakness on R side. Pt states she feels that things have declined since COVID 2020 (reports she had stopped going to PT during that time). Getting in/out of a car is hard, stairs are hard. Pt does have N/T in both feet. Pt states she does not have an AFO. Sometimes has dizziness and near  falls. Pt accompanied by: self  PERTINENT HISTORY: Per H&P: was diagnosed with MS in 2013 after presenting with thoracic transverse myelitis and was found to have multiple other lesions consistent with MS and right leg spasticity/foot drop. She presented with electrical sock sensations in her back and legs followed by numbness in the right flank/abdomen and allodynia in her right leg. She also had a right foot drop and spasticity. She also has numbness in her left foot. She was concerned about stroke and  saw Dr. Corwin Levins who did an MRI of the cervical/thoracic spine. She had a large non-enhancing lesion (was 3 months after 1st symptom) at T7-T11. MRI of the brain also showed lesions and MS was diagnosed. 2 NIDDM. She had EBV and Varicella as a child   PAIN:  Are you having pain? No  PRECAUTIONS: Fall  WEIGHT BEARING RESTRICTIONS: No  FALLS: Has patient fallen in last 6 months? 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, contracting 3x/wk   PATIENT GOALS: "The main thing that brings me in is coordination, balance, strength, and flexibility."   OBJECTIVE: (Measures in this section from initial evaluation unless otherwise noted)  DIAGNOSTIC FINDINGS: Nothing in 2024  SENSATION: Bilat lateral calf into bottom of feet  MUSCLE TONE: +1 spasticity with R ankle DF  MUSCLE LENGTH: Hamstrings: Right 70 deg; Left 70 deg Thomas test: did not assess  POSTURE: weight shift left and R LE abducted, foot in pronation/ER  LOWER EXTREMITY ROM:     Active  Right Eval Left Eval Right 04/28/23  Hip flexion 95 115 100  Hip extension     Hip abduction     Hip adduction     Hip internal rotation     Hip external rotation     Knee flexion     Knee extension      Ankle dorsiflexion 5 15 5   Ankle plantarflexion     Ankle inversion     Ankle eversion      (Blank rows = not tested)  LOWER EXTREMITY MMT:    MMT Right Eval Left Eval Right 04/28/23  Hip flexion 3- 5 3  Hip extension 3+ 3- 3+  Hip abduction 3+ 3 4-  Hip adduction     Hip internal rotation 3 5 3+  Hip external rotation 3 3+ 3+  Knee flexion 3+ 5 4  Knee extension 4 5 5   Ankle dorsiflexion     Ankle plantarflexion     Ankle inversion     Ankle eversion     (Blank rows = not tested)  TRANSFERS: Floor:  needs assessment  STAIRS: Level of Assistance: Modified independence Stair Negotiation Technique: Step to Pattern Alternating Pattern  with Bilateral Rails Number of Stairs: 10  Height of Stairs: 4-6"  Comments: Step to pattern on ascent; reciprocal pattern on descent. Needs use of bilat UE support  GAIT: Gait pattern: step through pattern, decreased stance time- Right, decreased hip/knee flexion- Right, decreased ankle dorsiflexion- Right, circumduction- Right, lateral hip instability, lateral lean- Right, and lateral lean- Left; R foot externally rotated Distance walked: 75'x4 Assistive device utilized: None Level of assistance: Complete Independence Comments: See gait pattern above  FUNCTIONAL TESTS:  5 times sit to stand: TBA (limited due to some residual knee soreness after bowling) Berg Balance Scale: 43/56 Dynamic Gait Index: 18/24  04/28/23 5 times sit to stand: 14 sec Berg Balance Scale:  Dynamic Gait Index:   PATIENT SURVEYS:  N/a  TODAY'S TREATMENT:  [facility] Adult PT Treatment:                                                DATE:  04/28/23 Therapeutic Exercise: Recumbent bike L3 x 5 min Standing gastroc stretch x 30 sec Standing soleus stretch x 30 sec Seated piriformis stretch x 30 sec Therapeutic Activity: Rechecking goals   [facility] Adult PT Treatment:                                                DATE: 04/26/2023 Therapeutic Exercise: Recumbent bike  L 3 x 5 min Supine: Feet on wall, bridge + clamshell GTB 2x10x3" Foot on wall knee to chest 2x10 Quadruped: Fire hydrants 2x10 B Hip extension x10 B Primal push up 3x10"    [facility] Adult PT Treatment:                                                DATE: 04/15/2023 Therapeutic Exercise: Elliptical fwd 2 min, bwd 2 min Quadruped: Hip ext 2x15 Fire hydrant 2x12 Primal push up 3x15" (therapist assist w/R foot)  Tall kneeling hip hinge into glute set 5# 2x12 Half kneeling: Static balance x30" With front leg hip ER against red TB x10 Leg press (RTB around knees) 120# x15 --> 140# x15, x10    [facility] Adult PT Treatment:                                                DATE: 04/13/23 Therapeutic Exercise: Elliptical fwd 2 min, bwd 2 min Quadruped Hip ext 2x10 Fire hydrant 2x10 Primal push up 2x10" Tall kneeling hip hinge into glute set 5# 2x10 Half kneeling Static balance x30 sec With front leg hip ER against red TB x10 Leg press 90# red TB around knees x10, 115# red TB around knees 2x 10 Supine Feet on wall, bridge 3x10 sec Feet on wall, walk up x5 Foot on wall step in/out 2x10 Foot on wall knee to chest 2x10                                                                                                                       PATIENT EDUCATION: Education details: Exam findings, POC, initial HEP Person educated: Patient Education method: Explanation, Demonstration, and Handouts Education comprehension: verbalized understanding, returned demonstration, and needs further education  HOME EXERCISE PROGRAM: Access Code: [email or web address] URL: [email or web address] Date: 03/02/2023 Prepared by: Vernon Prey April Kirstie Peri  Exercises - Standing Hip Abduction with Counter Support  - 1 x daily - 7 x weekly - 2-3 sets - 10 reps - Standing Hip Flexion with Counter Support  - 1 x daily - 7 x weekly - 2-3 sets - 10 reps - Standing Hip Extension with Counter Support  - 1  x daily - 7 x  weekly - 2-3 sets - 10 reps - Standing Heel Raise with Support  - 1 x daily - 7 x weekly - 2-3 sets - 10 reps - Seated Toe Raise  - 1 x daily - 7 x weekly - 2-3 sets - 10 reps  GOALS: Goals reviewed with patient? Yes  SHORT TERM GOALS: Target date: 03/30/2023  Pt will be ind with initial HEP Baseline: Goal status: MET  2.  Pt will be able to perform 5x STS without UE support to demo improving functional LE strength Baseline:  Goal status: MET  3.  Pt will be able to amb with normal reciprocal gait x 400' for home mobility Baseline:  Goal status: IN PROGRESS    LONG TERM GOALS: Target date: 04/27/2023  Pt will be ind with management and progression of HEP Baseline:  Goal status: MET  2.  Pt will have improved Berg Balance Score to >/=50/56 to demo MCID Baseline:  Goal status: MET  3.  Pt will have increased DGI to >/=20/24 to demo low fall risk Baseline:  Goal status: MET  4.  Pt will be able to safely ascend/descend 20 stairs with reciprocal pattern and one HR for home mobility Baseline:  Goal status: MET    ASSESSMENT:  CLINICAL IMPRESSION: Treatment focused on re-assessing goals. Pt has met almost all of her goals except gait/ambulation. She still demos limited ankle DF with gait affecting heel strike but with AFO she has much more normal gait pattern. We are currently waiting her AFO. She is demonstrating improved strength and balance as evidenced by her Berg Balance and DGI. Discussed holding PT until she gets her AFO and then we can establish new walking and standing goals and decrease PT frequency.   From eval: Patient is a 47 y.o. F who was seen today for physical therapy evaluation and treatment for MS and weakness. Pt notes ongoing decline since 2020 with near falls. Assessment significant for gross R LE weakness with resultant decrease in balance and ROM affecting gait and mobility. Pt with R foot drop and is open to use of AFO. Pt is at a high risk of falls  based on Berg Balance Test and DGI. Pt would benefit from PT to address these issues to maximize her level of function.   OBJECTIVE IMPAIRMENTS: Abnormal gait, decreased activity tolerance, decreased balance, decreased coordination, decreased endurance, decreased mobility, difficulty walking, decreased ROM, decreased strength, impaired flexibility, impaired sensation, improper body mechanics, and postural dysfunction.    PLAN:  PT FREQUENCY: 2x/week  PT DURATION: 8 weeks  PLANNED INTERVENTIONS: Therapeutic exercises, Therapeutic activity, Neuromuscular re-education, Balance training, Gait training, Patient/Family education, Self Care, Joint mobilization, Stair training, Vestibular training, Dry Needling, Electrical stimulation, Cryotherapy, Moist heat, Taping, Vasopneumatic device, Ionotophoresis 4mg /ml Dexamethasone, Manual therapy, and Re-evaluation  PLAN FOR NEXT SESSION: Continue quadruped/kneeling exercises. Progress gross R LE strengthening, ROM, stability, balance. Focus primarily on hips and ankles.    Denina Rieger April Ma L Vaneza Pickart, PT 04/28/2023, 11:09 AM

## 2023-05-03 ENCOUNTER — Ambulatory Visit: Payer: Federal, State, Local not specified - PPO

## 2023-05-05 ENCOUNTER — Ambulatory Visit: Payer: Federal, State, Local not specified - PPO | Admitting: Physical Therapy

## 2023-07-20 ENCOUNTER — Ambulatory Visit: Payer: Federal, State, Local not specified - PPO | Admitting: Orthopaedic Surgery

## 2023-07-20 ENCOUNTER — Other Ambulatory Visit (INDEPENDENT_AMBULATORY_CARE_PROVIDER_SITE_OTHER): Payer: Self-pay

## 2023-07-20 DIAGNOSIS — M25561 Pain in right knee: Secondary | ICD-10-CM

## 2023-07-21 DIAGNOSIS — M25561 Pain in right knee: Secondary | ICD-10-CM

## 2023-07-21 MED ORDER — METHYLPREDNISOLONE ACETATE 40 MG/ML IJ SUSP
40.0000 mg | INTRAMUSCULAR | Status: AC | PRN
  Administered 2023-07-21: 40 mg via INTRA_ARTICULAR

## 2023-07-21 MED ORDER — LIDOCAINE HCL 1 % IJ SOLN
0.5000 mL | INTRAMUSCULAR | Status: AC | PRN
  Administered 2023-07-21: .5 mL

## 2023-07-21 MED ORDER — BUPIVACAINE HCL 0.25 % IJ SOLN
4.0000 mL | INTRAMUSCULAR | Status: AC | PRN
  Administered 2023-07-21: 4 mL via INTRA_ARTICULAR

## 2023-07-21 NOTE — Progress Notes (Signed)
Office Visit Note   Patient: Karen Oconnor           Date of Birth: 1976-02-15           MRN: [id number] Visit Date: 07/20/2023              Requested by: Harvie Heck, MD [address] [place],  [place] [address] PCP: Harvie Heck, MD   Assessment & Plan: Visit Diagnoses:  1. Right knee pain, unspecified chronicity     Plan: Right knee injection performed for acute effusion.  She will elevate use ice.  She can continue the Naprosyn with food.  She will let us know if she is having persistent symptoms.  Follow-Up Instructions: No follow-ups on file.   Orders:  Orders Placed This Encounter  Procedures   XR KNEE 3 VIEW RIGHT   No orders of the defined types were placed in this encounter.     Procedures: Large Joint Inj: R knee on 07/21/2023 9:45 AM Indications: pain and joint swelling Details: 22 G 1.5 in needle, anterolateral approach  Arthrogram: No  Medications: 40 mg methylPREDNISolone acetate 40 MG/ML; 0.5 mL lidocaine 1 %; 4 mL bupivacaine 0.25 % Outcome: tolerated well, no immediate complications Procedure, treatment alternatives, risks and benefits explained, specific risks discussed. Consent was given by the patient. Immediately prior to procedure a time out was called to verify the correct patient, procedure, equipment, support staff and site/side marked as required. Patient was prepped and draped in the usual sterile fashion.       Clinical Data: No additional findings.   Subjective: Chief Complaint  Patient presents with   Right Knee - Pain    HPI 47 year old female with MS more weakness on the right lower extremity than left with documented MRI scan brain thoracic spine lumbar spine.  She states that she went bowling and after 1 game a significant increase in right knee pain and had to stop she is not sure of any specific twisting injury she has had trouble walking since that time.  She getting ready to go to [place] with  her daughter for 13-day trip and will be doing a lot of walking and is concerned about the right knee pain and mild swelling she is having.  Patient is on [place] and also Neurontin and is use Naprosyn without relief.  She is on baclofen for MS.  Review of Systems all systems noncontributory HPI.   Objective: Vital Signs: There were no vitals taken for this visit.  Physical Exam Constitutional:      Appearance: She is well-developed.  HENT:     Head: Normocephalic.     Right Ear: External ear normal.     Left Ear: External ear normal. There is no impacted cerumen.  Eyes:     Pupils: Pupils are equal, round, and reactive to light.  Neck:     Thyroid: No thyromegaly.     Trachea: No tracheal deviation.  Cardiovascular:     Rate and Rhythm: Normal rate.  Pulmonary:     Effort: Pulmonary effort is normal.  Abdominal:     Palpations: Abdomen is soft.  Musculoskeletal:     Cervical back: No rigidity.  Skin:    General: Skin is warm and dry.  Neurological:     Mental Status: She is alert and oriented to person, place, and time.  Psychiatric:        Behavior: Behavior normal.     Ortho Exam slow deliberate gait  with right knee limp limited knee flexion with ambulation.  She has mild crepitus knee flexion extension negative logroll hips.  Pain with patellofemoral loading.  Mild medial and lateral joint line tenderness with gentle flexion extension there is no locking.  Specialty Comments:  No specialty comments available.  Imaging: No results found.   PMFS History: Patient Active Problem List   Diagnosis Date Noted   Transverse myelitis (HCC) 02/25/2022   Multiple sclerosis (HCC) 02/25/2022   Right foot drop 02/25/2022   High risk medication use 02/25/2022   Allodynia 02/25/2022   Past Medical History:  Diagnosis Date   Diabetes (HCC)    MS (multiple sclerosis) (HCC)     Family History  Problem Relation Age of Onset   Gout Mother    Diabetes Father     Past  Surgical History:  Procedure Laterality Date   ABDOMINAL HYSTERECTOMY  2016   Social History   Occupational History   Not on file  Tobacco Use   Smoking status: Never   Smokeless tobacco: Never  Substance and Sexual Activity   Alcohol use: Yes    Comment: rare   Drug use: Never   Sexual activity: Not on file

## 2023-07-23 ENCOUNTER — Encounter: Payer: Self-pay | Admitting: Neurology

## 2023-07-26 ENCOUNTER — Encounter: Payer: Self-pay | Admitting: Neurology

## 2023-07-27 ENCOUNTER — Telehealth: Payer: Self-pay

## 2023-07-27 NOTE — Telephone Encounter (Signed)
Called Daughter and got to talk to pt, asked pt if she still needed her letter from Dr. Epimenio Foot. Pt stated that she does need letter. Pt provided email address [email or web address]. Sending letter today 07/27/2023

## 2023-08-11 ENCOUNTER — Encounter: Payer: Self-pay | Admitting: Orthopaedic Surgery

## 2023-08-11 DIAGNOSIS — M25561 Pain in right knee: Secondary | ICD-10-CM

## 2023-08-13 ENCOUNTER — Other Ambulatory Visit: Payer: Self-pay | Admitting: Neurology

## 2023-08-16 NOTE — Telephone Encounter (Signed)
Last seen on 02/25/23 Follow up scheduled on 09/28/23

## 2023-09-01 ENCOUNTER — Inpatient Hospital Stay
Admission: RE | Admit: 2023-09-01 | Discharge: 2023-09-01 | Discharge status: Home / Self Care | Payer: Federal, State, Local not specified - PPO | Source: Ambulatory Visit | Attending: Orthopaedic Surgery | Admitting: Orthopaedic Surgery

## 2023-09-01 DIAGNOSIS — M25561 Pain in right knee: Secondary | ICD-10-CM

## 2023-09-07 ENCOUNTER — Encounter: Payer: Self-pay | Admitting: Orthopaedic Surgery

## 2023-09-10 ENCOUNTER — Ambulatory Visit (INDEPENDENT_AMBULATORY_CARE_PROVIDER_SITE_OTHER): Payer: Federal, State, Local not specified - PPO | Admitting: Orthopaedic Surgery

## 2023-09-10 ENCOUNTER — Encounter: Payer: Self-pay | Admitting: Orthopaedic Surgery

## 2023-09-10 VITALS — BP 139/97 | HR 97 | Ht 66.0 in | Wt 204.0 lb

## 2023-09-10 DIAGNOSIS — M25561 Pain in right knee: Secondary | ICD-10-CM

## 2023-09-10 DIAGNOSIS — S83281D Other tear of lateral meniscus, current injury, right knee, subsequent encounter: Secondary | ICD-10-CM | POA: Diagnosis not present

## 2023-09-10 DIAGNOSIS — S83289A Other tear of lateral meniscus, current injury, unspecified knee, initial encounter: Secondary | ICD-10-CM | POA: Insufficient documentation

## 2023-09-10 DIAGNOSIS — M25569 Pain in unspecified knee: Secondary | ICD-10-CM | POA: Insufficient documentation

## 2023-09-10 NOTE — Progress Notes (Signed)
Office Visit Note   Patient: Karen Oconnor           Date of Birth: 10/09/1976           MRN: [id number] Visit Date: 09/10/2023              Requested by: Harvie Heck, MD [address] [place],  [place] [address] PCP: Harvie Heck, MD   Assessment & Plan: Visit Diagnoses:  1. Other tear of lateral meniscus of right knee, unspecified whether old or current tear, subsequent encounter   2. Pain of knee joint with osteochondral injury     Plan: MRI scan is reviewed.  She has a fairly large osteochondral lesion lateral compartment.  She has had multiple episodes of catching and is having to use her cane for ambulation.  Plan would be knee arthroscopy with debridement of osteochondral lesion and partial lateral meniscectomy.  Might require surgical treatment the parameniscal cyst medially but MRI does not show a definite medial meniscal tear.  Questions were elicited and answered will set up for outpatient right knee arthroscopy and debridement.  Follow-Up Instructions: No follow-ups on file.   Orders:  No orders of the defined types were placed in this encounter.  No orders of the defined types were placed in this encounter.     Procedures: No procedures performed   Clinical Data: No additional findings.   Subjective: Chief Complaint  Patient presents with   Right Knee - Pain    MRI review    HPI 47 year old female returns she had previous injection 2 months ago in her knee with persistent symptoms and MRI scan shows lateral meniscal tear as well as osteochondral lesion lateral femoral condyle 1.7 cm.  2.3 cm bronchial multiloculated medial joint line cyst possible parameniscal cyst.  Patellofemoral degenerative changes are noted as well.  Patient is use Naprosyn.  She likes to dance and has not been able to dance for a few months.  Knee has been catching swelling and painful to walk and she has been ambulating with a cane now for greater than 6  weeks.  Review of Systems history of MS.  She takes Neurontin and baclofen.  Negative cardiac history.    Objective: Vital Signs: BP (!) 139/97   Pulse 97   Ht 5\' 6"  (1.676 m)   Wt 204 lb (92.5 kg)   BMI 32.93 kg/m   Physical Exam Constitutional:      Appearance: She is well-developed.  HENT:     Head: Normocephalic.     Right Ear: External ear normal.     Left Ear: External ear normal. There is no impacted cerumen.  Eyes:     Pupils: Pupils are equal, round, and reactive to light.  Neck:     Thyroid: No thyromegaly.     Trachea: No tracheal deviation.  Cardiovascular:     Rate and Rhythm: Normal rate.  Pulmonary:     Effort: Pulmonary effort is normal.  Abdominal:     Palpations: Abdomen is soft.  Musculoskeletal:     Cervical back: No rigidity.  Skin:    General: Skin is warm and dry.  Neurological:     Mental Status: She is alert and oriented to person, place, and time.  Psychiatric:        Behavior: Behavior normal.     Ortho Exam patient with lateral joint line tenderness pain with hyperextension.  No palpable Baker's cyst.  2+ knee effusion.  Collateral ligaments ACL  PCL exam is normal.  Specialty Comments:  No specialty comments available.  Imaging: Narrative & Impression  CLINICAL DATA:  Right knee pain for the past 6 months with difficulty walking. No injury or prior surgery.   EXAM: MRI OF THE RIGHT KNEE WITHOUT CONTRAST   TECHNIQUE: Multiplanar, multisequence MR imaging of the knee was performed. No intravenous contrast was administered.   COMPARISON:  Right knee x-rays dated July 20, 2023.   FINDINGS: MENISCI   Medial meniscus:  Intact.   Lateral meniscus:  Small radial tear of the midbody.   LIGAMENTS   Cruciates:  Intact ACL and PCL.   Collaterals: Medial collateral ligament is intact. Lateral collateral ligament complex is intact.   CARTILAGE   Patellofemoral:  Mild partial-thickness cartilage loss.   Medial:  Mild  partial-thickness cartilage loss.   Lateral: 1.7 x 1.4 cm unstable osteochondral lesion of the peripheral lateral femoral condyle with surrounding marrow edema.   Joint: Small joint effusion. 2.7 x 1.2 x 4.1 cm slightly lobulated T2 and T1 hypointense mass in the lateral joint space adjacent to the patella (series 106, image 18; series 107, image 25). Normal Hoffa's fat.   Popliteal Fossa:  No Baker cyst. Intact popliteus tendon.   Extensor Mechanism: Intact quadriceps tendon and patellar tendon. Intact medial and lateral patellar retinaculum. Intact MPFL.   Bones: No acute fracture or dislocation. No suspicious bone lesion.   Other: 1.7 x 0.6 x 2.3 cm multiloculated cyst along the medial joint line (series 106, image 28; series 109, image 18).   IMPRESSION: 1. Small radial tear of the lateral meniscus midbody. 2. 1.7 cm unstable osteochondral lesion of the peripheral lateral femoral condyle. 3. 4.1 cm synovial mass in the lateral joint space adjacent to the patella, possibly focal nodular synovitis. 4. 2.3 cm multiloculated cyst along the medial joint line could represent a parameniscal cyst from occult meniscal tear or ganglion cyst. 5. Mild tricompartmental osteoarthritis.     Electronically Signed   By: Obie Dredge M.D.   On: 09/09/2023 14:03       PMFS History: Patient Active Problem List   Diagnosis Date Noted   Lateral meniscal tear 09/10/2023   Pain of knee joint with osteochondral injury 09/10/2023   Transverse myelitis (HCC) 02/25/2022   Multiple sclerosis (HCC) 02/25/2022   Right foot drop 02/25/2022   High risk medication use 02/25/2022   Allodynia 02/25/2022   Past Medical History:  Diagnosis Date   Diabetes (HCC)    MS (multiple sclerosis) (HCC)     Family History  Problem Relation Age of Onset   Gout Mother    Diabetes Father     Past Surgical History:  Procedure Laterality Date   ABDOMINAL HYSTERECTOMY  2016   Social History    Occupational History   Not on file  Tobacco Use   Smoking status: Never   Smokeless tobacco: Never  Substance and Sexual Activity   Alcohol use: Yes    Comment: rare   Drug use: Never   Sexual activity: Not on file

## 2023-09-13 ENCOUNTER — Encounter: Payer: Self-pay | Admitting: Orthopaedic Surgery

## 2023-09-13 ENCOUNTER — Other Ambulatory Visit: Payer: Self-pay | Admitting: Orthopaedic Surgery

## 2023-09-13 DIAGNOSIS — M23351 Other meniscus derangements, posterior horn of lateral meniscus, right knee: Secondary | ICD-10-CM | POA: Diagnosis not present

## 2023-09-13 DIAGNOSIS — M23311 Other meniscus derangements, anterior horn of medial meniscus, right knee: Secondary | ICD-10-CM | POA: Diagnosis not present

## 2023-09-13 DIAGNOSIS — M12261 Villonodular synovitis (pigmented), right knee: Secondary | ICD-10-CM | POA: Diagnosis not present

## 2023-09-13 MED ORDER — OXYCODONE-ACETAMINOPHEN 5-325 MG PO TABS: 1.0000 | ORAL_TABLET | Freq: Four times a day (QID) | ORAL | 0 refills | Status: DC | PRN

## 2023-09-14 ENCOUNTER — Ambulatory Visit: Payer: Federal, State, Local not specified - PPO | Admitting: Orthopaedic Surgery

## 2023-09-21 ENCOUNTER — Ambulatory Visit: Payer: Federal, State, Local not specified - PPO | Admitting: Orthopaedic Surgery

## 2023-09-21 VITALS — BP 152/91 | Ht 66.0 in | Wt 204.0 lb

## 2023-09-21 DIAGNOSIS — S83281S Other tear of lateral meniscus, current injury, right knee, sequela: Secondary | ICD-10-CM

## 2023-09-21 MED ORDER — OXYCODONE-ACETAMINOPHEN 5-325 MG PO TABS
1.0000 | ORAL_TABLET | Freq: Four times a day (QID) | ORAL | 0 refills | Status: DC | PRN
Start: 2023-09-21 — End: 2024-06-29

## 2023-09-21 NOTE — Progress Notes (Signed)
   Post-Op Visit Note   Patient: Karen Oconnor           Date of Birth: Sep 27, 1976           MRN: [id number] Visit Date: 09/21/2023 PCP: Harvie Heck, MD   Assessment & Plan: Follow-up medial and lateral meniscectomy right knee.  She had a chondral divot down to subchondral bone on the lateral femoral condyle where she also had complex lateral meniscal tear.  There is an area of PVNS anterolateral which was debrided as well.  Pain is much better she is using a walker.  Pain medication renewed she went back working at home she works from home on laptop.  Continue leg lifts knee range of motion exercises and gradual progress with ambulation return 1 month.  Chief Complaint:  Chief Complaint  Patient presents with   Right Knee - Routine Post Op    09/13/2023 right knee arthroscopy, debridement of osteochondral lesion, PLM   Visit Diagnoses: No diagnosis found.  Plan: Recheck 1 month.  Follow-Up Instructions: No follow-ups on file.   Orders:  No orders of the defined types were placed in this encounter.  No orders of the defined types were placed in this encounter.   Imaging: No results found.  PMFS History: Patient Active Problem List   Diagnosis Date Noted   Lateral meniscal tear 09/10/2023   Pain of knee joint with osteochondral injury 09/10/2023   Transverse myelitis (HCC) 02/25/2022   Multiple sclerosis (HCC) 02/25/2022   Right foot drop 02/25/2022   High risk medication use 02/25/2022   Allodynia 02/25/2022   Past Medical History:  Diagnosis Date   Diabetes (HCC)    MS (multiple sclerosis) (HCC)     Family History  Problem Relation Age of Onset   Gout Mother    Diabetes Father     Past Surgical History:  Procedure Laterality Date   ABDOMINAL HYSTERECTOMY  2016   Social History   Occupational History   Not on file  Tobacco Use   Smoking status: Never   Smokeless tobacco: Never  Substance and Sexual Activity   Alcohol use: Yes    Comment: rare    Drug use: Never   Sexual activity: Not on file

## 2023-09-22 ENCOUNTER — Ambulatory Visit: Payer: Federal, State, Local not specified - PPO | Admitting: Family Medicine

## 2023-09-28 ENCOUNTER — Ambulatory Visit (INDEPENDENT_AMBULATORY_CARE_PROVIDER_SITE_OTHER): Payer: Federal, State, Local not specified - PPO | Admitting: Neurology

## 2023-09-28 ENCOUNTER — Encounter: Payer: Self-pay | Admitting: Neurology

## 2023-09-28 VITALS — BP 115/83 | HR 92 | Ht 67.0 in | Wt 204.5 lb

## 2023-09-28 DIAGNOSIS — R269 Unspecified abnormalities of gait and mobility: Secondary | ICD-10-CM | POA: Diagnosis not present

## 2023-09-28 DIAGNOSIS — M21371 Foot drop, right foot: Secondary | ICD-10-CM

## 2023-09-28 DIAGNOSIS — G35D Multiple sclerosis, unspecified: Secondary | ICD-10-CM

## 2023-09-28 DIAGNOSIS — R208 Other disturbances of skin sensation: Secondary | ICD-10-CM | POA: Diagnosis not present

## 2023-09-28 DIAGNOSIS — Z79899 Other long term (current) drug therapy: Secondary | ICD-10-CM

## 2023-09-28 DIAGNOSIS — G35 Multiple sclerosis: Secondary | ICD-10-CM | POA: Diagnosis not present

## 2023-09-28 MED ORDER — DALFAMPRIDINE ER 10 MG PO TB12: ORAL_TABLET | ORAL | 3 refills | Status: DC

## 2023-09-28 NOTE — Progress Notes (Signed)
[facility]  PATIENT: Karen Oconnor DOB: [birth year]  REFERRING DOCTOR OR PCP: Harvie Heck, MD; Megan Mans, NP SOURCE: Patient, notes from [facility] between 2013 and present, imaging and lab reports, MRI images will be personally reviewed when available an addendum will be added  _________________________________   HISTORICAL  CHIEF COMPLAINT:  Chief Complaint  Patient presents with   Follow-up    Pt in room 10, mother in room. Here for MS follow up on Ocrevus. Pt reports doing well from MS standpoint. Pt has knee right surgery 09/13/23. No recent falls.     HISTORY OF PRESENT ILLNESS:  Karen Oconnor is a 47 y.o. woman with multiple sclerosis.  UPDATE 09/28/2023 She had arthroscopic surgery on the right knee due to a torn meniscus and is doing better.  She is currently on Ocrevus and tolerates it well.    Next infusion in April, 2025.   She has no infusion reactions. She has had no exacerbations since starting in 2021.     IgG and IgM are in the normal range.     Gait is mildly off balanced and she has a right foot drop.    Some stumbles but no falls.   She uses the banister on stairs    She has right leg weakness and some spasticity.   Ampyra had helped but she has had trouble getting it recently   Baclofen helps.  She has altered sensation sensation in her right leg and a different altered sensation right flank with numbness.    She is doing some home PT.   She would like to do outpatient PT at the [facility]   Bladder function is doing ok with some urgency but no recent incontinence.   Bowel incontinence topped with d/c metformin     Vision was poor as a child and she feels it has been stable and she is much better with contacts.     Color vision is fine.   She never had diplopia.     She has fatigue but accomplishes daily goals.   She has some insomnia, sleep onset worse than maintenance.About 3 nights a month.  Mood and cognition  are doing well.    She also has type 2 NIDDM.   She had EBV and Varicella as a child.    Weight is up a few pounds the last 6 months and she has had mild HTN.  MS HISTORY In July 2013, she presented with electrical sock sensations in her back and legs followed by numbness in the right flank/abdomen and allodynia in her right leg.   She also had a right foot drop and spasticity.   She also has numbness in her left foot.   She was concerned about stroke and saw Dr. Corwin Levins who did an MRI of the cervical/thoracic spine.   She had a large non-enhancing lesion (was 3 months after 1st symptom) at T7-T11.   MRI of the brain also showed lesions and MS was diagnosed.   She was initially placed on Tecfidera and stayed on that medication until March 2021 after an MRI February 2021 showed additional multiple sclerosis foci.  She was placed on Ocrevus March 2021 and continues on that medication.  She has been seeing Dr. Melrose Nakayama at [facility]   Imaging (report): MRI of the brain 12/09/2019 showed multiple T2/FLAIR hyperintense foci in the periventricular, juxtacortical and deep white matter.  2 of the foci, 1 high in the right frontal lobe  and and other in the periventricular white matter adjacent to the left ventricular atrium.  MRI of the cervical and thoracic spine 12/09/2019 showed a focus at T7-T8 centrally towards the right and possible focus at C3-C4.  MRI report from 08/24/2012 showed multiple T2/FLAIR hyperintense foci in the cerebral hemispheres consistent with MS with enhancement of a focus in the left parietal lobe.  MRI of the cervical and thoracic spine 08/11/2012 shows a right hemicord focus from T7-T11 that did not enhance.  The cervical spinal cord was normal.  Laboratory tests: 11/28/2019 Quantiferon TB and hepatitis B and C tests were negative.  IgG and IgM and IgA were normal.  08/30/2012: Anti-NMO IgG was negative   In 2023,  anti-NMO and anti-MOG and both are negative.    REVIEW OF  SYSTEMS: Constitutional: No fevers, chills, sweats, or change in appetite Eyes: No visual changes, double vision, eye pain Ear, nose and throat: No hearing loss, ear pain, nasal congestion, sore throat Cardiovascular: No chest pain, palpitations Respiratory:  No shortness of breath at rest or with exertion.   No wheezes GastrointestinaI: No nausea, vomiting, diarrhea, abdominal pain, fecal incontinence Genitourinary:  No dysuria, urinary retention or frequency.  No nocturia. Musculoskeletal:  No neck pain, back pain Integumentary: No rash, pruritus, skin lesions Neurological: as above Psychiatric: No depression at this time.  No anxiety Endocrine: No palpitations, diaphoresis, change in appetite, change in weigh or increased thirst Hematologic/Lymphatic:  No anemia, purpura, petechiae. Allergic/Immunologic: No itchy/runny eyes, nasal congestion, recent allergic reactions, rashes  ALLERGIES: No Known Allergies  HOME MEDICATIONS:  Current Outpatient Medications:    Alogliptin Benzoate 25 MG TABS, Take 1 tablet by mouth daily., Disp: , Rfl:    baclofen (LIORESAL) 10 MG tablet, Take 1 tablet (10 mg total) by mouth 3 (three) times daily as needed., Disp: 270 each, Rfl: 1   Biotin 10 MG CAPS, Take 10,000 mcg by mouth daily., Disp: , Rfl:    Cholecalciferol 25 MCG (1000 UT) capsule, Take 5,000 Units by mouth daily., Disp: , Rfl:    dalfampridine 10 MG TB12, One po q12 hours, Disp: 180 tablet, Rfl: 3   FARXIGA 10 MG TABS tablet, Take 10 mg by mouth daily., Disp: , Rfl:    gabapentin (NEURONTIN) 300 MG capsule, TAKE 3 CAPSULES TWICE DAILY, Disp: 540 capsule, Rfl: 1   naproxen (NAPROSYN) 250 MG tablet, Take 1 tablet (250 mg total) by mouth 2 (two) times daily as needed., Disp: 180 tablet, Rfl: 1   ocrelizumab (OCREVUS) 300 MG/10ML injection, Inject 600 mg into the vein every 6 (six) months., Disp: , Rfl:    oxyCODONE-acetaminophen (PERCOCET/ROXICET) 5-325 MG tablet, Take 1-2 tablets by mouth  every 6 (six) hours as needed for severe pain (pain score 7-10)., Disp: 40 tablet, Rfl: 0  PAST MEDICAL HISTORY: Past Medical History:  Diagnosis Date   Diabetes (HCC)    MS (multiple sclerosis) (HCC)     PAST SURGICAL HISTORY:   FAMILY HISTORY: Family History  Problem Relation Age of Onset   Gout Mother    Diabetes Father     SOCIAL HISTORY:  Social History   Socioeconomic History   Marital status: Single    Spouse name: Not on file   Number of children: Not on file   Years of education: Not on file   Highest education level: Not on file  Occupational History   Not on file  Tobacco Use   Smoking status: Never   Smokeless tobacco: Never  Substance  and Sexual Activity   Alcohol use: Yes    Comment: rare   Drug use: Never   Sexual activity: Not on file  Other Topics Concern   Not on file  Social History Narrative   Right handed   Caffeine use: coffee/tea daily   Social Determinants of Health   Financial Resource Strain: Low Risk  (08/18/2023)   Received from [facility]   Overall Financial Resource Strain (CARDIA)    Difficulty of Paying Living Expenses: Not hard at all  Food Insecurity: No Food Insecurity (08/18/2023)   Received from [facility]   Hunger Vital Sign    Worried About Running Out of Food in the Last Year: Never true    Ran Out of Food in the Last Year: Never true  Transportation Needs: No Transportation Needs (08/18/2023)   Received from [facility] - Transportation    Lack of Transportation (Medical): No    Lack of Transportation (Non-Medical): No  Physical Activity: Sufficiently Active (08/18/2023)   Received from [facility]   Exercise Vital Sign    Days of Exercise per Week: 3 days    Minutes of Exercise per Session: 150+ min  Stress: Stress Concern Present (08/18/2023)   Received from [facility] - Occupational Stress Questionnaire    Feeling of Stress : To some extent   Social Connections: Socially Integrated (08/18/2023)   Received from [facility]   Social Network    How would you rate your social network (family, work, friends)?: Good participation with social networks  Intimate Partner Violence: Not At Risk (08/18/2023)   Received from [facility]   HITS    Over the last 12 months how often did your partner physically hurt you?: Never    Over the last 12 months how often did your partner insult you or talk down to you?: Never    Over the last 12 months how often did your partner threaten you with physical harm?: Never    Over the last 12 months how often did your partner scream or curse at you?: Never     PHYSICAL EXAM  Vitals:   09/28/23 1111  BP: (!) 164/101  Pulse: 92  Weight: 204 lb 8 oz (92.8 kg)  Height: 5\' 7"  (1.702 m)    Body mass index is 32.03 kg/m.     General: The patient is well-developed and well-nourished and in no acute distress  HEENT:  Head is [place]/AT.  Sclera are anicteric.     Skin: Extremities are without rash or  edema.  Musculoskeletal:  Back is nontender.  The knee is slightly painful on the left to anterior drawer maneuver.  Valgus and varus and posterior drawer were fine   Neurologic Exam  Mental status: The patient is alert and oriented x 3 at the time of the examination. The patient has apparent normal recent and remote memory, with an apparently normal attention span and concentration ability.   Speech is normal.  Cranial nerves: Extraocular movements are full.   Visual acuity was reduced OD versus OS.  Color vision was symmetric.  There is good facial sensation to soft touch bilaterally.Facial strength is normal.  Trapezius and sternocleidomastoid strength is normal. No dysarthria is noted.    No obvious hearing deficits are noted.  Motor:  Muscle bulk is normal.   Tone is increased in the right leg. . Strength is  5 / 5 in arms and left leg  but 4-/5 iliopsoas and 4+/5 quad 4/5 EHL and ankle  extension,   Sensory: She reported mild altered sensation in the right flank as well as altered sensation in the right leg and both feet.  Vibration sensation was minimally reduced in the right foot and temperature sensation felt different in the legs than the arms but was not necessarily reduced.  Coordination: Finger-nose-finger was performed well.  Heel-to-shin was reduced bilaterally  Gait and station: Station is normal.  She has a right foot drop and spastic right gait.  The tandem gait is poor.  Romberg was negative. . Reflexes: Deep tendon reflexes are symmetric and normal n arms but increased 3+ right and 3 left knee and anle.       ASSESSMENT AND PLAN  Multiple sclerosis (HCC)  Gait difficulty  Right foot drop  Allodynia  High risk medication use  Continue Ocrevus.  Recheck IgG/IgM and CD20/CD19 at next visit.  If she continues to be stable we can hold off on MRI of the brain until 2025 to check for subclinical progression. Stay active and exercise as tolerated. Use AFO script .  Trial of Ampyra Return in 6 months or sooner for new or worsening neurologic symptoms.  Kemari Mares A. Epimenio Foot, MD, [facility] 09/28/2023, 11:31 AM Certified in Neurology, Clinical Neurophysiology, Sleep Medicine and Neuroimaging  [facility] [address] [place], [place] [address] [phone]

## 2023-10-22 ENCOUNTER — Other Ambulatory Visit (HOSPITAL_COMMUNITY): Payer: Self-pay

## 2023-10-22 ENCOUNTER — Encounter: Payer: Self-pay | Admitting: Orthopaedic Surgery

## 2023-10-22 ENCOUNTER — Telehealth: Payer: Self-pay | Admitting: Pharmacy Technician

## 2023-10-22 DIAGNOSIS — S83281S Other tear of lateral meniscus, current injury, right knee, sequela: Secondary | ICD-10-CM

## 2023-10-22 NOTE — Telephone Encounter (Signed)
Pharmacy Patient Advocate Encounter   Received notification from CoverMyMeds that prior authorization for Dalfampridine ER 10MG  er tablets is required/requested.   Insurance verification completed.   The patient is insured through [facility] .   Per test claim: PA required; PA submitted to above mentioned insurance via CoverMyMeds Key/confirmation #/EOC [id number] Status is pending

## 2023-10-22 NOTE — Telephone Encounter (Signed)
Pharmacy Patient Advocate Encounter  Received notification from [facility] that Prior Authorization for Dalfampridine ER 10MG  er tablets has been APPROVED from 09/22/2023 to 10/21/2024   PA #/Case ID/Reference #: [id number]

## 2023-10-26 ENCOUNTER — Ambulatory Visit (INDEPENDENT_AMBULATORY_CARE_PROVIDER_SITE_OTHER): Payer: Federal, State, Local not specified - PPO | Admitting: Orthopaedic Surgery

## 2023-10-26 VITALS — BP 118/78 | HR 87

## 2023-10-26 DIAGNOSIS — S83281S Other tear of lateral meniscus, current injury, right knee, sequela: Secondary | ICD-10-CM

## 2023-10-26 NOTE — Progress Notes (Signed)
   Post-Op Visit Note   Patient: Karen Oconnor           Date of Birth: [birth year]           MRN: [id number] Visit Date: 10/26/2023 PCP: Leonce Sink, MD   Assessment & Plan: Follow-up knee arthroscopy with chondroplasty lateral compartment debridement of meniscal tear laterally.  She had some tearing of medial meniscus as well.  Right legs are weak leg she walks with sort of a stiff knee gait chronically.  She uses a quad cane in her left hand when she goes out but not in the house.  Chronic problems with right side more than left side weakness from her multiple sclerosis.  She is happy with results swelling is down in her knee she is walking better and has not left pain.  Follow-up as needed.  We reviewed previous standing x-rays as well as MRI scan before surgery at today's visit.  Chief Complaint:  Chief Complaint  Patient presents with   Right Knee - Follow-up, Routine Post Op     09/13/2023 right knee arthroscopy, debridement of osteochondral lesion, PLM     Visit Diagnoses:  1. Other tear of lateral meniscus of right knee, unspecified whether old or current tear, sequela     Plan: Patient is improved postarthroscopy is happy with results of surgery follow-up as needed.  Follow-Up Instructions: No follow-ups on file.   Orders:  No orders of the defined types were placed in this encounter.  No orders of the defined types were placed in this encounter.   Imaging: No results found.  PMFS History: Patient Active Problem List   Diagnosis Date Noted   Lateral meniscal tear 09/10/2023   Pain of knee joint with osteochondral injury 09/10/2023   Transverse myelitis (HCC) 02/25/2022   Multiple sclerosis (HCC) 02/25/2022   Right foot drop 02/25/2022   High risk medication use 02/25/2022   Allodynia 02/25/2022   Past Medical History:  Diagnosis Date   Diabetes (HCC)    MS (multiple sclerosis) (HCC)     Family History  Problem Relation Age of Onset   Gout Mother     Diabetes Father     Past Surgical History:  Procedure Laterality Date   ABDOMINAL HYSTERECTOMY  2016   Social History   Occupational History   Not on file  Tobacco Use   Smoking status: Never   Smokeless tobacco: Never  Substance and Sexual Activity   Alcohol use: Yes    Comment: rare   Drug use: Never   Sexual activity: Not on file

## 2023-10-29 ENCOUNTER — Encounter: Payer: Federal, State, Local not specified - PPO | Admitting: Orthopaedic Surgery

## 2023-11-03 ENCOUNTER — Other Ambulatory Visit: Payer: Self-pay

## 2023-11-03 ENCOUNTER — Ambulatory Visit: Payer: Federal, State, Local not specified - PPO | Attending: Orthopaedic Surgery

## 2023-11-03 DIAGNOSIS — M6281 Muscle weakness (generalized): Secondary | ICD-10-CM

## 2023-11-03 DIAGNOSIS — S83281D Other tear of lateral meniscus, current injury, right knee, subsequent encounter: Secondary | ICD-10-CM | POA: Insufficient documentation

## 2023-11-03 DIAGNOSIS — R2689 Other abnormalities of gait and mobility: Secondary | ICD-10-CM | POA: Diagnosis present

## 2023-11-03 DIAGNOSIS — X58XXXD Exposure to other specified factors, subsequent encounter: Secondary | ICD-10-CM | POA: Insufficient documentation

## 2023-11-03 DIAGNOSIS — M25561 Pain in right knee: Secondary | ICD-10-CM

## 2023-11-03 NOTE — Therapy (Signed)
 OUTPATIENT PHYSICAL THERAPY LOWER EXTREMITY EVALUATION   Patient Name: Karen Oconnor MRN: [id number] DOB:Sep 09, 1976, 48 y.o., female Today's Date: 11/03/2023  END OF SESSION:  PT End of Session - 11/03/23 1151     Visit Number 1    Number of Visits 17    Date for PT Re-Evaluation 01/01/24    Authorization Type BCBS    PT Start Time 1151    PT Stop Time 1229    PT Time Calculation (min) 38 min    Activity Tolerance Patient tolerated treatment well    Behavior During Therapy WFL for tasks assessed/performed             Past Medical History:  Diagnosis Date   Diabetes (HCC)    MS (multiple sclerosis) (HCC)    Past Surgical History:  Procedure Laterality Date   ABDOMINAL HYSTERECTOMY  2016   Patient Active Problem List   Diagnosis Date Noted   Lateral meniscal tear 09/10/2023   Pain of knee joint with osteochondral injury 09/10/2023   Transverse myelitis (HCC) 02/25/2022   Multiple sclerosis (HCC) 02/25/2022   Right foot drop 02/25/2022   High risk medication use 02/25/2022   Allodynia 02/25/2022    PCP: Leonce Sink, MD   REFERRING PROVIDER: Barbarann Oneil BROCKS, MD   REFERRING DIAG: [phone] (ICD-10-CM) - Other tear of lateral meniscus of right knee, unspecified whether old or current tear, sequela   THERAPY DIAG:  Acute pain of right knee  Other abnormalities of gait and mobility  Muscle weakness (generalized)  Rationale for Evaluation and Treatment: Rehabilitation  ONSET DATE: 09/13/23  SUBJECTIVE:   SUBJECTIVE STATEMENT: Patient underwent knee surgery in November and has been doing exercises at home, but needs more help in how to progress her ROM and strength. The pain is better since the surgery. Still has pain in the knee if she twists and when she goes down the stairs. Patient was diagnosed with MS in 2013 and feels her balance, coordination, and strength is effected on the Rt side. She does have spasticity in the calf. She also has neuropathy in both  feet.   PERTINENT HISTORY: MS PAIN:  Are you having pain? Yes: NPRS scale: none currently; 5/10 at worst Pain location: Rt anterior knee Pain description: burning,stretching Aggravating factors: stair descent, twisting, excessive flexion  Relieving factors: straightening the knee   PRECAUTIONS: Fall  RED FLAGS: None   WEIGHT BEARING RESTRICTIONS: No  FALLS:  Has patient fallen in last 6 months? Yes. Number of falls 2; tripped over grate and fell, LOB and fell  LIVING ENVIRONMENT: Lives with: lives with their daughter Lives in: House/apartment Stairs: Yes: Internal: 2 x 7 steps; on left going up and External: 7 steps; can reach both Has following equipment at home: Single point cane, Quad cane small base, and Walker - 2 wheeled  OCCUPATION: work from home   PLOF: Independent  PATIENT GOALS: get the full ROM, guidance in exercise.   NEXT MD VISIT: nothing scheduled   OBJECTIVE:  Note: Objective measures were completed at Evaluation unless otherwise noted.  DIAGNOSTIC FINDINGS:  Rt knee MRI: IMPRESSION: 1. Small radial tear of the lateral meniscus midbody. 2. 1.7 cm unstable osteochondral lesion of the peripheral lateral femoral condyle. 3. 4.1 cm synovial mass in the lateral joint space adjacent to the patella, possibly focal nodular synovitis. 4. 2.3 cm multiloculated cyst along the medial joint line could represent a parameniscal cyst from occult meniscal tear or ganglion cyst. 5. Mild tricompartmental osteoarthritis.  COGNITION: Overall cognitive status: Within functional limits for tasks assessed     SENSATION: Not tested; patient endorses bilateral neuropathy   EDEMA:  Unable to inspect knee due to clothing   POSTURE: weight shifts Lt, maintains RLE abducted with foot ER.   PALPATION: TTP lateral aspect of Rt knee   LOWER EXTREMITY ROM:  Active ROM Right eval Left eval  Hip flexion    Hip extension    Hip abduction    Hip adduction     Hip internal rotation    Hip external rotation    Knee flexion 85 in sitting   Knee extension Lacking 4   Ankle dorsiflexion    Ankle plantarflexion    Ankle inversion    Ankle eversion     (Blank rows = not tested)  LOWER EXTREMITY MMT:  MMT Right eval Left eval  Hip flexion 3- 5  Hip extension    Hip abduction    Hip adduction    Hip internal rotation    Hip external rotation    Knee flexion 4- 5  Knee extension 4- 5  Ankle dorsiflexion 3- 5  Ankle plantarflexion 3+ in sitting 5  Ankle inversion 3- 5  Ankle eversion 3- 5   (Blank rows = not tested)  LOWER EXTREMITY SPECIAL TESTS:  N/A  FUNCTIONAL TESTS:  Sit to stand: maintains Rt knee in extension; significant use of BUE  Stair negotiation: reciprocal stair negotiation with ascent; step to descent   GAIT: Distance walked: 20 ft  Assistive device utilized: None Level of assistance: Complete Independence Comments: circumduction RLE, decreased knee flexion RLE, Rt foot ER, lateral trunk lean                                                                                                                                [facility] Adult PT Treatment:                                                DATE: 11/03/23 Therapeutic Exercise: Demonstrated and issued initial HEP.       PATIENT EDUCATION:  Education details: see treatment; POC; assessment findings  Person educated: Patient and Child(ren) Education method: Explanation, Demonstration, Tactile cues, Verbal cues, and Handouts Education comprehension: verbalized understanding, returned demonstration, verbal cues required, tactile cues required, and needs further education  HOME EXERCISE PROGRAM: Access Code: [email or web address] URL: [email or web address] Date: 11/03/2023 Prepared by: Lucie Meeter  Exercises - Supine Quad Set  - 1 x daily - 7 x weekly - 2 sets - 10 reps - 5 sec hold - Seated Knee Flexion AAROM  - 1 x daily - 7 x weekly - 1 sets - 10 reps -  5 sec  hold - Seated Active Assistive Knee Flexion and Extension  - 1 x daily - 7 x  weekly - 1 sets - 10 reps - 5 sec  hold - Standing Terminal Knee Extension with Resistance  - 1 x daily - 7 x weekly - 2 sets - 10 reps - Sit to Stand with Armchair  - 1 x daily - 7 x weekly - 2 sets - 10 reps - Long Sitting 4 Way Patellar Glide  - 1 x daily - 7 x weekly - 3 minutes  hold  ASSESSMENT:  CLINICAL IMPRESSION: Patient is a 47 y.o. female who was seen today for physical therapy evaluation and treatment for s/p Rt knee arthroscopy, debridement of osteochondral lesion and partial lateral meniscectomy on 09/13/23. She also has a history of MS reporting balance, strength and coordination deficits on the RLE. Upon assessment she is noted to have limited Rt knee ROM, RLE weakness, difficulty with transfers and stair negotiation, and gait abnormalities. She will benefit from skilled PT to address the above stated deficits in order to optimize her function and assist with pain reduction.   OBJECTIVE IMPAIRMENTS: Abnormal gait, decreased activity tolerance, decreased balance, decreased coordination, difficulty walking, decreased ROM, decreased strength, hypomobility, impaired sensation, improper body mechanics, postural dysfunction, and pain.   ACTIVITY LIMITATIONS: carrying, lifting, bending, standing, squatting, stairs, transfers, and locomotion level  PARTICIPATION LIMITATIONS: meal prep, cleaning, laundry, shopping, and community activity  PERSONAL FACTORS: Age, Fitness, Time since onset of injury/illness/exacerbation, and 1 comorbidity: MS  are also affecting patient's functional outcome.   REHAB POTENTIAL: Good  CLINICAL DECISION MAKING: Evolving/moderate complexity  EVALUATION COMPLEXITY: Moderate   GOALS: Goals reviewed with patient? Yes  SHORT TERM GOALS: Target date: 12/01/2023   Patient will be independent and compliant with initial HEP.   Baseline: issued at eval  Goal status:  INITIAL  2.  Patient will perform sit to stand without UE support to improve ease of transfers.  Baseline: see above  Goal status: INITIAL  3.  Patient will demonstrate at least 100 degrees of Rt knee flexion AROM to improve gait mechanics.  Baseline: see above  Goal status: INITIAL   LONG TERM GOALS: Target date: 01/01/24  Patient will be able to descend stairs with reciprocal pattern with handrail support.  Baseline: step to  Goal status: INITIAL  2.  Patient will demonstrate full Rt knee extension to improve gait mechanics.  Baseline: see above  Goal status: INITIAL  3.  Patient will return to dance activity without limitation as it relates to her knee.  Baseline: currently using brace and avoiding twisting motions.  Goal status: INITIAL  4.  Patient will be independent with advanced home program to progress/maintain current level of function.  Baseline: initial HEP issued  Goal status: INITIAL    PLAN:  PT FREQUENCY: 1-2x/week  PT DURATION: 8 weeks  PLANNED INTERVENTIONS: 97164- PT Re-evaluation, 97110-Therapeutic exercises, 97530- Therapeutic activity, 97112- Neuromuscular re-education, 97535- Self Care, 02859- Manual therapy, [id number]- Gait training, 97014- Electrical stimulation (unattended), [id number]- Electrical stimulation (manual), 97016- Vasopneumatic device, Dry Needling, Joint mobilization, and Cryotherapy  PLAN FOR NEXT SESSION: review/progress HEP prn; work on knee ROM, sit to stand transfers; youth worker. Gait training.    Laquisha Northcraft, PT, DPT, ATC 11/03/23 1:13 PM

## 2023-11-08 ENCOUNTER — Ambulatory Visit: Payer: Federal, State, Local not specified - PPO

## 2023-11-08 DIAGNOSIS — M6281 Muscle weakness (generalized): Secondary | ICD-10-CM

## 2023-11-08 DIAGNOSIS — R2681 Unsteadiness on feet: Secondary | ICD-10-CM

## 2023-11-08 DIAGNOSIS — R2689 Other abnormalities of gait and mobility: Secondary | ICD-10-CM | POA: Diagnosis not present

## 2023-11-08 DIAGNOSIS — M25561 Pain in right knee: Secondary | ICD-10-CM

## 2023-11-08 NOTE — Therapy (Signed)
 OUTPATIENT PHYSICAL THERAPY LOWER EXTREMITY TREATMENT   Patient Name: Karen Oconnor MRN: [id number] DOB:1976-06-22, 48 y.o., female Today's Date: 11/08/2023  END OF SESSION:  PT End of Session - 11/08/23 1402     Visit Number 2    Number of Visits 17    Date for PT Re-Evaluation 01/01/24    Authorization Type BCBS    PT Start Time 1402    PT Stop Time 1448    PT Time Calculation (min) 46 min    Activity Tolerance Patient tolerated treatment well    Behavior During Therapy WFL for tasks assessed/performed             Past Medical History:  Diagnosis Date   Diabetes (HCC)    MS (multiple sclerosis) (HCC)    Past Surgical History:  Procedure Laterality Date   ABDOMINAL HYSTERECTOMY  2016   Patient Active Problem List   Diagnosis Date Noted   Lateral meniscal tear 09/10/2023   Pain of knee joint with osteochondral injury 09/10/2023   Transverse myelitis (HCC) 02/25/2022   Multiple sclerosis (HCC) 02/25/2022   Right foot drop 02/25/2022   High risk medication use 02/25/2022   Allodynia 02/25/2022    PCP: Leonce Sink, MD   REFERRING PROVIDER: Barbarann Oneil BROCKS, MD   REFERRING DIAG: [id number] (ICD-10-CM) - Other tear of lateral meniscus of right knee, unspecified whether old or current tear, sequela   THERAPY DIAG:  Acute pain of right knee  Other abnormalities of gait and mobility  Muscle weakness (generalized)  Unsteadiness on feet  Rationale for Evaluation and Treatment: Rehabilitation  ONSET DATE: 09/13/23  SUBJECTIVE:   SUBJECTIVE STATEMENT: Patient reports she has increased pain at lateral knee with deep knee bend; states she has pain when going down stairs. Patient states she has been using a pedaler at home and wants to return to gym.  Patient underwent knee surgery in November and has been doing exercises at home, but needs more help in how to progress her ROM and strength. The pain is better since the surgery. Still has pain in the knee if she  twists and when she goes down the stairs. Patient was diagnosed with MS in 2013 and feels her balance, coordination, and strength is effected on the Rt side. She does have spasticity in the calf. She also has neuropathy in both feet.   PERTINENT HISTORY: MS PAIN:  Are you having pain? Yes: NPRS scale: none currently; 5/10 at worst Pain location: Rt anterior knee Pain description: burning,stretching Aggravating factors: stair descent, twisting, excessive flexion  Relieving factors: straightening the knee   PRECAUTIONS: Fall  RED FLAGS: None   WEIGHT BEARING RESTRICTIONS: No  FALLS:  Has patient fallen in last 6 months? Yes. Number of falls 2; tripped over grate and fell, LOB and fell  LIVING ENVIRONMENT: Lives with: lives with their daughter Lives in: House/apartment Stairs: Yes: Internal: 2 x 7 steps; on left going up and External: 7 steps; can reach both Has following equipment at home: Single point cane, Quad cane small base, and Walker - 2 wheeled  OCCUPATION: work from home   PLOF: Independent  PATIENT GOALS: get the full ROM, guidance in exercise.   NEXT MD VISIT: nothing scheduled   OBJECTIVE:  Note: Objective measures were completed at Evaluation unless otherwise noted.  DIAGNOSTIC FINDINGS:  Rt knee MRI: IMPRESSION: 1. Small radial tear of the lateral meniscus midbody. 2. 1.7 cm unstable osteochondral lesion of the peripheral lateral femoral condyle. 3. 4.1 cm  synovial mass in the lateral joint space adjacent to the patella, possibly focal nodular synovitis. 4. 2.3 cm multiloculated cyst along the medial joint line could represent a parameniscal cyst from occult meniscal tear or ganglion cyst. 5. Mild tricompartmental osteoarthritis.  COGNITION: Overall cognitive status: Within functional limits for tasks assessed     SENSATION: Not tested; patient endorses bilateral neuropathy   EDEMA:  Unable to inspect knee due to clothing   POSTURE: weight  shifts Lt, maintains RLE abducted with foot ER.   PALPATION: TTP lateral aspect of Rt knee   LOWER EXTREMITY ROM:  Active ROM Right eval Left eval  Hip flexion    Hip extension    Hip abduction    Hip adduction    Hip internal rotation    Hip external rotation    Knee flexion 85 in sitting   Knee extension Lacking 4   Ankle dorsiflexion    Ankle plantarflexion    Ankle inversion    Ankle eversion     (Blank rows = not tested)  LOWER EXTREMITY MMT:  MMT Right eval Left eval  Hip flexion 3- 5  Hip extension    Hip abduction    Hip adduction    Hip internal rotation    Hip external rotation    Knee flexion 4- 5  Knee extension 4- 5  Ankle dorsiflexion 3- 5  Ankle plantarflexion 3+ in sitting 5  Ankle inversion 3- 5  Ankle eversion 3- 5   (Blank rows = not tested)  LOWER EXTREMITY SPECIAL TESTS:  N/A  FUNCTIONAL TESTS:  Sit to stand: maintains Rt knee in extension; significant use of BUE  Stair negotiation: reciprocal stair negotiation with ascent; step to descent   GAIT: Distance walked: 20 ft  Assistive device utilized: None Level of assistance: Complete Independence Comments: circumduction RLE, decreased knee flexion RLE, Rt foot ER, lateral trunk lean   [facility] Adult PT Treatment:                                            DATE: 11/08/2023 Therapeutic Exercise: Recumbent bike --> NuStep L6 x 4 min Seated on airex --> assisted foot slides for knee flexion Seated knee flexion stretch (airex) 5x10 Quad set with mall towel roll SAQ (green bolster) 10x5  S/L straight leg hip abd (foot propped on airex) x10 Standing (R) HS curls  Manual Therapy: Patella glides --> focus on medial  Instruction on self-patella glides for HEP                                                                                                                              [facility] Adult PT Treatment:  DATE: 11/03/23 Therapeutic  Exercise: Demonstrated and issued initial HEP.   PATIENT EDUCATION:  Education details: see treatment; POC; assessment findings  Person educated: Patient and Child(ren) Education method: Explanation, Demonstration, Tactile cues, Verbal cues, and Handouts Education comprehension: verbalized understanding, returned demonstration, verbal cues required, tactile cues required, and needs further education  HOME EXERCISE PROGRAM: Access Code: [email or web address] URL: [email or web address] Date: 11/08/2023 Prepared by: Lamarr Price  Exercises - Supine Quad Set  - 1 x daily - 7 x weekly - 2 sets - 10 reps - 5 sec hold - Seated Knee Flexion AAROM  - 1 x daily - 7 x weekly - 1 sets - 10 reps - 5 sec  hold - Seated Active Assistive Knee Flexion and Extension  - 1 x daily - 7 x weekly - 1 sets - 10 reps - 5 sec  hold - Standing Terminal Knee Extension with Resistance  - 1 x daily - 7 x weekly - 2 sets - 10 reps - Sit to Stand with Armchair  - 1 x daily - 7 x weekly - 2 sets - 10 reps - Long Sitting 4 Way Patellar Glide  - 1 x daily - 7 x weekly - 3 minutes  hold - Standing Knee Flexion  - 1 x daily - 7 x weekly - 3 sets - 10 reps - Supine Knee Extension Strengthening  - 1 x daily - 7 x weekly - 3 sets - 10 reps - 5 sec hold - Standing Hip Abduction with Counter Support  - 1 x daily - 7 x weekly - 3 sets - 10 reps  ASSESSMENT:  CLINICAL IMPRESSION: Review and instruction provided on self-patella glides; noted tightness with medial patella glides. Seated assisted knee flexion progressed with added trunk flexion to increase stretch. Improved quad activation noted with modification form quad set to short arc quad.   Patient is a 48 y.o. female who was seen today for physical therapy evaluation and treatment for s/p Rt knee arthroscopy, debridement of osteochondral lesion and partial lateral meniscectomy on 09/13/23. She also has a history of MS reporting balance, strength and coordination deficits  on the RLE. Upon assessment she is noted to have limited Rt knee ROM, RLE weakness, difficulty with transfers and stair negotiation, and gait abnormalities. She will benefit from skilled PT to address the above stated deficits in order to optimize her function and assist with pain reduction.   OBJECTIVE IMPAIRMENTS: Abnormal gait, decreased activity tolerance, decreased balance, decreased coordination, difficulty walking, decreased ROM, decreased strength, hypomobility, impaired sensation, improper body mechanics, postural dysfunction, and pain.   ACTIVITY LIMITATIONS: carrying, lifting, bending, standing, squatting, stairs, transfers, and locomotion level  PARTICIPATION LIMITATIONS: meal prep, cleaning, laundry, shopping, and community activity  PERSONAL FACTORS: Age, Fitness, Time since onset of injury/illness/exacerbation, and 1 comorbidity: MS  are also affecting patient's functional outcome.   REHAB POTENTIAL: Good  CLINICAL DECISION MAKING: Evolving/moderate complexity  EVALUATION COMPLEXITY: Moderate   GOALS: Goals reviewed with patient? Yes  SHORT TERM GOALS: Target date: 12/01/2023  Patient will be independent and compliant with initial HEP.  Baseline: issued at eval  Goal status: INITIAL  2.  Patient will perform sit to stand without UE support to improve ease of transfers.  Baseline: see above  Goal status: INITIAL  3.  Patient will demonstrate at least 100 degrees of Rt knee flexion AROM to improve gait mechanics.  Baseline: see above  Goal status: INITIAL   LONG TERM GOALS: Target date: 01/01/24  Patient  will be able to descend stairs with reciprocal pattern with handrail support.  Baseline: step to  Goal status: INITIAL  2.  Patient will demonstrate full Rt knee extension to improve gait mechanics.  Baseline: see above  Goal status: INITIAL  3.  Patient will return to dance activity without limitation as it relates to her knee.  Baseline: currently using brace  and avoiding twisting motions.  Goal status: INITIAL  4.  Patient will be independent with advanced home program to progress/maintain current level of function.  Baseline: initial HEP issued  Goal status: INITIAL    PLAN:  PT FREQUENCY: 1-2x/week  PT DURATION: 8 weeks  PLANNED INTERVENTIONS: 97164- PT Re-evaluation, 97110-Therapeutic exercises, 97530- Therapeutic activity, 97112- Neuromuscular re-education, 97535- Self Care, [id number]- Manual therapy, [id number]- Gait training, 97014- Electrical stimulation (unattended), [id number]- Electrical stimulation (manual), 97016- Vasopneumatic device, Dry Needling, Joint mobilization, and Cryotherapy  PLAN FOR NEXT SESSION: review/progress HEP prn; work on knee ROM, sit to stand transfers; youth worker. Gait training.    Lamarr Price, PTA 11/08/23 2:51 PM

## 2023-11-12 ENCOUNTER — Encounter: Payer: Self-pay | Admitting: Rehabilitative and Restorative Service Providers"

## 2023-11-12 ENCOUNTER — Ambulatory Visit: Payer: Federal, State, Local not specified - PPO | Admitting: Rehabilitative and Restorative Service Providers"

## 2023-11-12 DIAGNOSIS — M25561 Pain in right knee: Secondary | ICD-10-CM

## 2023-11-12 DIAGNOSIS — R2681 Unsteadiness on feet: Secondary | ICD-10-CM

## 2023-11-12 DIAGNOSIS — M6281 Muscle weakness (generalized): Secondary | ICD-10-CM

## 2023-11-12 DIAGNOSIS — R2689 Other abnormalities of gait and mobility: Secondary | ICD-10-CM | POA: Diagnosis not present

## 2023-11-12 NOTE — Therapy (Signed)
OUTPATIENT PHYSICAL THERAPY LOWER EXTREMITY TREATMENT   Patient Name: Karen Oconnor MRN: [id number] DOB:1976/03/29, 48 y.o., female Today's Date: 11/12/2023  END OF SESSION:  PT End of Session - 11/12/23 0849     Visit Number 3    Number of Visits 17    Date for PT Re-Evaluation 01/01/24    Authorization Type BCBS    PT Start Time 0850    PT Stop Time 0930    PT Time Calculation (min) 40 min    Activity Tolerance Patient tolerated treatment well    Behavior During Therapy [facility] for tasks assessed/performed              Past Medical History:  Diagnosis Date   Diabetes (HCC)    MS (multiple sclerosis) (HCC)    Past Surgical History:  Procedure Laterality Date   ABDOMINAL HYSTERECTOMY  2016   Patient Active Problem List   Diagnosis Date Noted   Lateral meniscal tear 09/10/2023   Pain of knee joint with osteochondral injury 09/10/2023   Transverse myelitis (HCC) 02/25/2022   Multiple sclerosis (HCC) 02/25/2022   Right foot drop 02/25/2022   High risk medication use 02/25/2022   Allodynia 02/25/2022    PCP: Harvie Heck, MD  REFERRING PROVIDER: Eldred Manges, MD   REFERRING DIAG: [phone] (ICD-10-CM) - Other tear of lateral meniscus of right knee, unspecified whether old or current tear, sequela   THERAPY DIAG:  Acute pain of right knee  Other abnormalities of gait and mobility  Muscle weakness (generalized)  Unsteadiness on feet  Rationale for Evaluation and Treatment: Rehabilitation  ONSET DATE: 09/13/23  SUBJECTIVE:   SUBJECTIVE STATEMENT: Patient reports she has increased pain at lateral knee with deep knee bend. Knee is stiff.   EVAL: Patient underwent knee surgery in November and has been doing exercises at home, but needs more help in how to progress her ROM and strength. The pain is better since the surgery. Still has pain in the knee if she twists and when she goes down the stairs. Patient was diagnosed with MS in 2013 and feels her balance,  coordination, and strength is effected on the Rt side. She does have spasticity in the calf. She also has neuropathy in both feet.   PERTINENT HISTORY: MS PAIN:  Are you having pain? Yes: NPRS scale: none currently; 5/10 at worst Pain location: Rt anterior knee Pain description: burning,stretching Aggravating factors: stair descent, twisting, excessive flexion  Relieving factors: straightening the knee   PRECAUTIONS: Fall  WEIGHT BEARING RESTRICTIONS: No  FALLS:  Has patient fallen in last 6 months? Yes. Number of falls 2; tripped over grate and fell, LOB and fell  LIVING ENVIRONMENT: Lives with: lives with their daughter Lives in: House/apartment Stairs: Yes: Internal: 2 x 7 steps; on left going up and External: 7 steps; can reach both Has following equipment at home: Single point cane, Quad cane small base, and Walker - 2 wheeled  OCCUPATION: work from home   PATIENT GOALS: "get the full ROM, guidance in exercise."   OBJECTIVE:  Note: Objective measures were completed at Evaluation unless otherwise noted.  DIAGNOSTIC FINDINGS:  Rt knee MRI: IMPRESSION: 1. Small radial tear of the lateral meniscus midbody. 2. 1.7 cm unstable osteochondral lesion of the peripheral lateral femoral condyle. 3. 4.1 cm synovial mass in the lateral joint space adjacent to the patella, possibly focal nodular synovitis. 4. 2.3 cm multiloculated cyst along the medial joint line could represent a parameniscal cyst from occult meniscal tear  or ganglion cyst. 5. Mild tricompartmental osteoarthritis.  COGNITION: Overall cognitive status: Within functional limits for tasks assessed     SENSATION: Not tested; patient endorses bilateral neuropathy   EDEMA:  Unable to inspect knee due to clothing  POSTURE: weight shifts Lt, maintains RLE abducted with foot ER.   PALPATION: TTP lateral aspect of Rt knee   LOWER EXTREMITY ROM: Active ROM Right eval Left eval Right 11/12/23  Hip flexion      Hip extension     Hip abduction     Hip adduction     Hip internal rotation     Hip external rotation     Knee flexion 85 in sitting  92 in sitting  Knee extension Lacking 4    Ankle dorsiflexion     Ankle plantarflexion     Ankle inversion     Ankle eversion      (Blank rows = not tested)  LOWER EXTREMITY MMT: MMT Right eval Left eval  Hip flexion 3- 5  Hip extension    Hip abduction    Hip adduction    Hip internal rotation    Hip external rotation    Knee flexion 4- 5  Knee extension 4- 5  Ankle dorsiflexion 3- 5  Ankle plantarflexion 3+ in sitting 5  Ankle inversion 3- 5  Ankle eversion 3- 5   (Blank rows = not tested)  FUNCTIONAL TESTS:  Sit to stand: maintains Rt knee in extension; significant use of BUE  Stair negotiation: reciprocal stair negotiation with ascent; step to descent   GAIT: Distance walked: 20 ft  Assistive device utilized: None Level of assistance: Complete Independence Comments: circumduction RLE, decreased knee flexion RLE, Rt foot ER, lateral trunk lean   [facility] Adult PT Treatment:                                                DATE: 11/12/23 Therapeutic Exercise: Recumbent bike mini revolutions with assist to place R leg on pedal Supine Assisted heel slides (due to R hip flexor weakness) R LE on/off edge of mat with assist-- painful in ITB Quad set with ball under coregous ball (deflated) x 10 reps Hamstring isometrics into physioball x 5 reps with 5 second holds Standing Forward/backward walking near countertop x 10 feet Sidestepping x 10 feet x 6 reps Marching x 10 reps Foot in stride position with focus on R knee flexion Gastroc stretch Seated Isometric knee extension x 10 reps  Knee flexion x 3 reps for AAROM Manual Therapy: IASTM IT Band R side to tolerance Gait: Emphasis on gait mechanics Forward/backwards walking Recommended SPC to help normalize gait mechanics for the R knee  [facility] Adult PT Treatment:                                             DATE: 11/08/2023 Therapeutic Exercise: Recumbent bike --> NuStep L6 x 4 min Seated on airex --> assisted foot slides for knee flexion Seated knee flexion stretch (airex) 5x10" Quad set with mall towel roll SAQ (green bolster) 10x5"  S/L straight leg hip abd (foot propped on airex) x10 Standing (R) HS curls  Manual Therapy: Patella glides --> focus on medial  Instruction on self-patella glides for HEP  [facility] Adult PT Treatment:                                                DATE: 11/03/23 Therapeutic Exercise: Demonstrated and issued initial HEP.   PATIENT EDUCATION:  Education details: see treatment; POC; assessment findings  Person educated: Patient and Child(ren) Education method: Explanation, Demonstration, Tactile cues, Verbal cues, and Handouts Education comprehension: verbalized understanding, returned demonstration, verbal cues required, tactile cues required, and needs further education  HOME EXERCISE PROGRAM: Access Code: [email or web address] URL: [email or web address] Date: 11/08/2023 Prepared by: Carlynn Herald  Exercises - Supine Quad Set  - 1 x daily - 7 x weekly - 2 sets - 10 reps - 5 sec hold - Seated Knee Flexion AAROM  - 1 x daily - 7 x weekly - 1 sets - 10 reps - 5 sec  hold - Seated Active Assistive Knee Flexion and Extension  - 1 x daily - 7 x weekly - 1 sets - 10 reps - 5 sec  hold - Standing Terminal Knee Extension with Resistance  - 1 x daily - 7 x weekly - 2 sets - 10 reps - Sit to Stand with Armchair  - 1 x daily - 7 x weekly - 2 sets - 10 reps - Long Sitting 4 Way Patellar Glide  - 1 x daily - 7 x weekly - 3 minutes  hold - Standing Knee Flexion  - 1 x daily - 7 x weekly - 3 sets - 10 reps - Supine Knee Extension Strengthening  - 1 x daily - 7 x weekly - 3 sets - 10 reps - 5 sec hold - Standing Hip Abduction  with Counter Support  - 1 x daily - 7 x weekly - 3 sets - 10 reps  ASSESSMENT:  CLINICAL IMPRESSION: Patient uses R knee circumduction and knee extended position during gait. PT progressing exercise to tolerance. She tolerated recumbent bike and also demonstrated greater ROM today. Plan to progress to tolerance working on normalizing gait pattern as MS allows. She may benefit from trial of foot up brace to reduce foot drop.   Patient is a 48 y.o. female who was seen today for physical therapy evaluation and treatment for s/p Rt knee arthroscopy, debridement of osteochondral lesion and partial lateral meniscectomy on 09/13/23. She also has a history of MS reporting balance, strength and coordination deficits on the RLE. Upon assessment she is noted to have limited Rt knee ROM, RLE weakness, difficulty with transfers and stair negotiation, and gait abnormalities. She will benefit from skilled PT to address the above stated deficits in order to optimize her function and assist with pain reduction.   OBJECTIVE IMPAIRMENTS: Abnormal gait, decreased activity tolerance, decreased balance, decreased coordination, difficulty walking, decreased ROM, decreased strength, hypomobility, impaired sensation, improper body mechanics, postural dysfunction, and pain.   GOALS: Goals reviewed with patient? Yes  SHORT TERM GOALS: Target date: 12/01/2023  Patient will be independent and compliant with initial HEP.  Baseline: issued at eval  Goal status: INITIAL  2.  Patient will perform sit to stand without UE support to improve ease of transfers.  Baseline: see above  Goal status: INITIAL  3.  Patient will demonstrate at least 100 degrees of Rt knee flexion AROM to improve gait mechanics.  Baseline: see above  Goal status: INITIAL   LONG TERM GOALS: Target date:  01/01/24  Patient will be able to descend stairs with reciprocal pattern with handrail support.  Baseline: step to  Goal status: INITIAL  2.   Patient will demonstrate full Rt knee extension to improve gait mechanics.  Baseline: see above  Goal status: INITIAL  3.  Patient will return to dance activity without limitation as it relates to her knee.  Baseline: currently using brace and avoiding twisting motions.  Goal status: INITIAL  4.  Patient will be independent with advanced home program to progress/maintain current level of function.  Baseline: initial HEP issued  Goal status: INITIAL  PLAN:  PT FREQUENCY: 1-2x/week  PT DURATION: 8 weeks  PLANNED INTERVENTIONS: 97164- PT Re-evaluation, 97110-Therapeutic exercises, 97530- Therapeutic activity, 97112- Neuromuscular re-education, 97535- Self Care, 16109- Manual therapy, [id number]- Gait training, 97014- Electrical stimulation (unattended), [id number]- Electrical stimulation (manual), 97016- Vasopneumatic device, Dry Needling, Joint mobilization, and Cryotherapy  PLAN FOR NEXT SESSION: review/progress HEP prn; work on knee ROM, sit to stand transfers; Youth worker. Gait training.    Jerrine Urschel, PT 11/12/23 8:49 AM

## 2023-11-15 ENCOUNTER — Ambulatory Visit: Payer: Federal, State, Local not specified - PPO

## 2023-11-15 DIAGNOSIS — M25561 Pain in right knee: Secondary | ICD-10-CM

## 2023-11-15 DIAGNOSIS — R2681 Unsteadiness on feet: Secondary | ICD-10-CM

## 2023-11-15 DIAGNOSIS — R2689 Other abnormalities of gait and mobility: Secondary | ICD-10-CM | POA: Diagnosis not present

## 2023-11-15 DIAGNOSIS — M6281 Muscle weakness (generalized): Secondary | ICD-10-CM

## 2023-11-15 NOTE — Therapy (Signed)
OUTPATIENT PHYSICAL THERAPY LOWER EXTREMITY TREATMENT   Patient Name: Karen Oconnor MRN: [id number] DOB:[birth year], 48 y.o., female Today's Date: 11/15/2023  END OF SESSION:  PT End of Session - 11/15/23 1105     Visit Number 4    Number of Visits 17    Date for PT Re-Evaluation 01/01/24    Authorization Type BCBS    PT Start Time 1105    PT Stop Time 1147    PT Time Calculation (min) 42 min    Activity Tolerance Patient tolerated treatment well    Behavior During Therapy WFL for tasks assessed/performed            Past Medical History:  Diagnosis Date   Diabetes (HCC)    MS (multiple sclerosis) (HCC)    Past Surgical History:  Procedure Laterality Date   ABDOMINAL HYSTERECTOMY  2016   Patient Active Problem List   Diagnosis Date Noted   Lateral meniscal tear 09/10/2023   Pain of knee joint with osteochondral injury 09/10/2023   Transverse myelitis (HCC) 02/25/2022   Multiple sclerosis (HCC) 02/25/2022   Right foot drop 02/25/2022   High risk medication use 02/25/2022   Allodynia 02/25/2022    PCP: Harvie Heck, MD  REFERRING PROVIDER: Eldred Manges, MD   REFERRING DIAG: [id number] (ICD-10-CM) - Other tear of lateral meniscus of right knee, unspecified whether old or current tear, sequela   THERAPY DIAG:  Acute pain of right knee  Muscle weakness (generalized)  Unsteadiness on feet  Other abnormalities of gait and mobility  Rationale for Evaluation and Treatment: Rehabilitation  ONSET DATE: 09/13/23  SUBJECTIVE:   SUBJECTIVE STATEMENT: Patient reports she has been doing scar massage on knee and has been walking with cane.   EVAL: Patient underwent knee surgery in November and has been doing exercises at home, but needs more help in how to progress her ROM and strength. The pain is better since the surgery. Still has pain in the knee if she twists and when she goes down the stairs. Patient was diagnosed with MS in 2013 and feels her balance,  coordination, and strength is effected on the Rt side. She does have spasticity in the calf. She also has neuropathy in both feet.   PERTINENT HISTORY: MS PAIN:  Are you having pain? Yes: NPRS scale: none currently; 5/10 at worst Pain location: Rt anterior knee Pain description: burning,stretching Aggravating factors: stair descent, twisting, excessive flexion  Relieving factors: straightening the knee   PRECAUTIONS: Fall  WEIGHT BEARING RESTRICTIONS: No  FALLS:  Has patient fallen in last 6 months? Yes. Number of falls 2; tripped over grate and fell, LOB and fell  LIVING ENVIRONMENT: Lives with: lives with their daughter Lives in: House/apartment Stairs: Yes: Internal: 2 x 7 steps; on left going up and External: 7 steps; can reach both Has following equipment at home: Single point cane, Quad cane small base, and Walker - 2 wheeled  OCCUPATION: work from home   PATIENT GOALS: "get the full ROM, guidance in exercise."   OBJECTIVE:  Note: Objective measures were completed at Evaluation unless otherwise noted.  DIAGNOSTIC FINDINGS:  Rt knee MRI: IMPRESSION: 1. Small radial tear of the lateral meniscus midbody. 2. 1.7 cm unstable osteochondral lesion of the peripheral lateral femoral condyle. 3. 4.1 cm synovial mass in the lateral joint space adjacent to the patella, possibly focal nodular synovitis. 4. 2.3 cm multiloculated cyst along the medial joint line could represent a parameniscal cyst from occult meniscal tear or ganglion  cyst. 5. Mild tricompartmental osteoarthritis.  COGNITION: Overall cognitive status: Within functional limits for tasks assessed     SENSATION: Not tested; patient endorses bilateral neuropathy   EDEMA:  Unable to inspect knee due to clothing  POSTURE: weight shifts Lt, maintains RLE abducted with foot ER.   PALPATION: TTP lateral aspect of Rt knee   LOWER EXTREMITY ROM: Active ROM Right eval Left eval Right 11/12/23  Hip flexion      Hip extension     Hip abduction     Hip adduction     Hip internal rotation     Hip external rotation     Knee flexion 85 in sitting  92 in sitting  Knee extension Lacking 4    Ankle dorsiflexion     Ankle plantarflexion     Ankle inversion     Ankle eversion      (Blank rows = not tested)  LOWER EXTREMITY MMT: MMT Right eval Left eval  Hip flexion 3- 5  Hip extension    Hip abduction    Hip adduction    Hip internal rotation    Hip external rotation    Knee flexion 4- 5  Knee extension 4- 5  Ankle dorsiflexion 3- 5  Ankle plantarflexion 3+ in sitting 5  Ankle inversion 3- 5  Ankle eversion 3- 5   (Blank rows = not tested)  FUNCTIONAL TESTS:  Sit to stand: maintains Rt knee in extension; significant use of BUE  Stair negotiation: reciprocal stair negotiation with ascent; step to descent   GAIT: Distance walked: 20 ft  Assistive device utilized: None Level of assistance: Complete Independence Comments: circumduction RLE, decreased knee flexion RLE, Rt foot ER, lateral trunk lean   [facility] Adult PT Treatment:                                                DATE: 11/15/2023 Therapeutic Exercise: Recumbent bike mini revolutions with assist to place R leg on pedal Long-sitting: Assisted heel slide up + AROM knee extension Supine: AAROM heel slides for flexion with strap assist (due to R hip flexor weakness) Quad set with deflated coregous ball under knee x 10 reps Seated: Hamstring isometric --> pressing into orange PB AROM heel slides with slider x10 AROM Isometric knee extension x10  Standing:  Gastroc stretch Hip abd 2x10 (R) Marching 2x10  Hip ext 2x10 (R) Therapeutic Activity: Gait training with SPC (adjusted for proper height)  Staggered stance weight shifting + step fwd/bkwd   [facility] Adult PT Treatment:                                                DATE: 11/12/23 Therapeutic Exercise: Recumbent bike mini revolutions with assist to place R leg on  pedal Supine Assisted heel slides (due to R hip flexor weakness) R LE on/off edge of mat with assist-- painful in ITB Quad set with ball under coregous ball (deflated) x 10 reps Hamstring isometrics into physioball x 5 reps with 5 second holds Standing Forward/backward walking near countertop x 10 feet Sidestepping x 10 feet x 6 reps Marching x 10 reps Foot in stride position with focus on R knee flexion Gastroc stretch Seated Isometric knee extension  x 10 reps  Knee flexion x 3 reps for AAROM Manual Therapy: IASTM IT Band R side to tolerance Gait: Emphasis on gait mechanics Forward/backwards walking Recommended SPC to help normalize gait mechanics for the R knee   [facility] Adult PT Treatment:                                            DATE: 11/08/2023 Therapeutic Exercise: Recumbent bike --> NuStep L6 x 4 min Seated on airex --> assisted foot slides for knee flexion Seated knee flexion stretch (airex) 5x10" Quad set with mall towel roll SAQ (green bolster) 10x5"  S/L straight leg hip abd (foot propped on airex) x10 Standing (R) HS curls  Manual Therapy: Patella glides --> focus on medial  Instruction on self-patella glides for HEP                                                                                                                              PATIENT EDUCATION:  Education details: updated HEP Person educated: Patient and Child(ren) Education method: Explanation, Demonstration, Tactile cues, Verbal cues, and Handouts Education comprehension: verbalized understanding, returned demonstration, verbal cues required, tactile cues required, and needs further education  HOME EXERCISE PROGRAM: Access Code: [email or web address] URL: [email or web address] Date: 11/08/2023 Prepared by: Carlynn Herald  Exercises - Supine Quad Set  - 1 x daily - 7 x weekly - 2 sets - 10 reps - 5 sec hold - Seated Knee Flexion AAROM  - 1 x daily - 7 x weekly - 1 sets - 10 reps - 5 sec   hold - Seated Active Assistive Knee Flexion and Extension  - 1 x daily - 7 x weekly - 1 sets - 10 reps - 5 sec  hold - Standing Terminal Knee Extension with Resistance  - 1 x daily - 7 x weekly - 2 sets - 10 reps - Sit to Stand with Armchair  - 1 x daily - 7 x weekly - 2 sets - 10 reps - Long Sitting 4 Way Patellar Glide  - 1 x daily - 7 x weekly - 3 minutes  hold - Standing Knee Flexion  - 1 x daily - 7 x weekly - 3 sets - 10 reps - Supine Knee Extension Strengthening  - 1 x daily - 7 x weekly - 3 sets - 10 reps - 5 sec hold - Standing Hip Abduction with Counter Support  - 1 x daily - 7 x weekly - 3 sets - 10 reps  ASSESSMENT:  CLINICAL IMPRESSION: SPC adjusted to proper height and gait training continued with focus on functional knee flexion and decreasing circumductive gait; improved reciprocal gait pattern noted today. Knee isometric exercises and standing hip strengthening continued as tolerated by patient.  Patient is a 48 y.o. female who was seen today for physical therapy evaluation and  treatment for s/p Rt knee arthroscopy, debridement of osteochondral lesion and partial lateral meniscectomy on 09/13/23. She also has a history of MS reporting balance, strength and coordination deficits on the RLE. Upon assessment she is noted to have limited Rt knee ROM, RLE weakness, difficulty with transfers and stair negotiation, and gait abnormalities. She will benefit from skilled PT to address the above stated deficits in order to optimize her function and assist with pain reduction.   OBJECTIVE IMPAIRMENTS: Abnormal gait, decreased activity tolerance, decreased balance, decreased coordination, difficulty walking, decreased ROM, decreased strength, hypomobility, impaired sensation, improper body mechanics, postural dysfunction, and pain.   GOALS: Goals reviewed with patient? Yes  SHORT TERM GOALS: Target date: 12/01/2023  Patient will be independent and compliant with initial HEP.  Baseline:  issued at eval  Goal status: INITIAL  2.  Patient will perform sit to stand without UE support to improve ease of transfers.  Baseline: see above  Goal status: INITIAL  3.  Patient will demonstrate at least 100 degrees of Rt knee flexion AROM to improve gait mechanics.  Baseline: see above  Goal status: INITIAL   LONG TERM GOALS: Target date: 01/01/24  Patient will be able to descend stairs with reciprocal pattern with handrail support.  Baseline: step to  Goal status: INITIAL  2.  Patient will demonstrate full Rt knee extension to improve gait mechanics.  Baseline: see above  Goal status: INITIAL  3.  Patient will return to dance activity without limitation as it relates to her knee.  Baseline: currently using brace and avoiding twisting motions.  Goal status: INITIAL  4.  Patient will be independent with advanced home program to progress/maintain current level of function.  Baseline: initial HEP issued  Goal status: INITIAL  PLAN:  PT FREQUENCY: 1-2x/week  PT DURATION: 8 weeks  PLANNED INTERVENTIONS: 97164- PT Re-evaluation, 97110-Therapeutic exercises, 97530- Therapeutic activity, 97112- Neuromuscular re-education, 97535- Self Care, 16109- Manual therapy, [id number]- Gait training, 97014- Electrical stimulation (unattended), [id number]- Electrical stimulation (manual), 97016- Vasopneumatic device, Dry Needling, Joint mobilization, and Cryotherapy  PLAN FOR NEXT SESSION: review/progress HEP prn; work on knee ROM, sit to stand transfers; Youth worker. Gait training.    Sanjuana Mae, PTA 11/15/23 11:52 AM

## 2023-11-17 ENCOUNTER — Ambulatory Visit: Payer: Federal, State, Local not specified - PPO | Admitting: Rehabilitative and Restorative Service Providers"

## 2023-11-17 DIAGNOSIS — M6281 Muscle weakness (generalized): Secondary | ICD-10-CM

## 2023-11-17 DIAGNOSIS — M25561 Pain in right knee: Secondary | ICD-10-CM

## 2023-11-17 DIAGNOSIS — R2689 Other abnormalities of gait and mobility: Secondary | ICD-10-CM

## 2023-11-17 DIAGNOSIS — R2681 Unsteadiness on feet: Secondary | ICD-10-CM

## 2023-11-17 NOTE — Therapy (Signed)
OUTPATIENT PHYSICAL THERAPY LOWER EXTREMITY TREATMENT   Patient Name: Karen Oconnor MRN: [id number] DOB:[birth year], 48 y.o., female Today's Date: 11/17/2023  END OF SESSION:  PT End of Session - 11/17/23 1405     Visit Number 5    Number of Visits 17    Date for PT Re-Evaluation 01/01/24    Authorization Type BCBS    PT Start Time 1403    PT Stop Time 1446    PT Time Calculation (min) 43 min    Activity Tolerance Patient tolerated treatment well    Behavior During Therapy [facility] for tasks assessed/performed            Past Medical History:  Diagnosis Date   Diabetes (HCC)    MS (multiple sclerosis) (HCC)    Past Surgical History:  Procedure Laterality Date   ABDOMINAL HYSTERECTOMY  2016   Patient Active Problem List   Diagnosis Date Noted   Lateral meniscal tear 09/10/2023   Pain of knee joint with osteochondral injury 09/10/2023   Transverse myelitis (HCC) 02/25/2022   Multiple sclerosis (HCC) 02/25/2022   Right foot drop 02/25/2022   High risk medication use 02/25/2022   Allodynia 02/25/2022    PCP: Harvie Heck, MD  REFERRING PROVIDER: Eldred Manges, MD   REFERRING DIAG: [id number] (ICD-10-CM) - Other tear of lateral meniscus of right knee, unspecified whether old or current tear, sequela   THERAPY DIAG:  Acute pain of right knee  Muscle weakness (generalized)  Unsteadiness on feet  Other abnormalities of gait and mobility  Rationale for Evaluation and Treatment: Rehabilitation  ONSET DATE: 09/13/23  SUBJECTIVE:   SUBJECTIVE STATEMENT: Patient reports she feels better with walking since cane was adjusted, states she also adjusted her quad cane as well.   EVAL: Patient underwent knee surgery in November and has been doing exercises at home, but needs more help in how to progress her ROM and strength. The pain is better since the surgery. Still has pain in the knee if she twists and when she goes down the stairs. Patient was diagnosed with MS in 2013  and feels her balance, coordination, and strength is effected on the Rt side. She does have spasticity in the calf. She also has neuropathy in both feet.   PERTINENT HISTORY: MS PAIN:  Are you having pain? Yes: NPRS scale: none currently; 5/10 at worst Pain location: Rt anterior knee Pain description: burning,stretching Aggravating factors: stair descent, twisting, excessive flexion  Relieving factors: straightening the knee   PRECAUTIONS: Fall  WEIGHT BEARING RESTRICTIONS: No  FALLS:  Has patient fallen in last 6 months? Yes. Number of falls 2; tripped over grate and fell, LOB and fell  LIVING ENVIRONMENT: Lives with: lives with their daughter Lives in: House/apartment Stairs: Yes: Internal: 2 x 7 steps; on left going up and External: 7 steps; can reach both Has following equipment at home: Single point cane, Quad cane small base, and Walker - 2 wheeled  OCCUPATION: work from home   PATIENT GOALS: "get the full ROM, guidance in exercise."   OBJECTIVE:  Note: Objective measures were completed at Evaluation unless otherwise noted.  DIAGNOSTIC FINDINGS:  Rt knee MRI: IMPRESSION: 1. Small radial tear of the lateral meniscus midbody. 2. 1.7 cm unstable osteochondral lesion of the peripheral lateral femoral condyle. 3. 4.1 cm synovial mass in the lateral joint space adjacent to the patella, possibly focal nodular synovitis. 4. 2.3 cm multiloculated cyst along the medial joint line could represent a parameniscal cyst from occult  meniscal tear or ganglion cyst. 5. Mild tricompartmental osteoarthritis.  COGNITION: Overall cognitive status: Within functional limits for tasks assessed     SENSATION: Not tested; patient endorses bilateral neuropathy   EDEMA:  Unable to inspect knee due to clothing  POSTURE: weight shifts Lt, maintains RLE abducted with foot ER.   PALPATION: TTP lateral aspect of Rt knee   LOWER EXTREMITY ROM: Active ROM Right eval Left eval  Right 11/12/23  Hip flexion     Hip extension     Hip abduction     Hip adduction     Hip internal rotation     Hip external rotation     Knee flexion 85 in sitting  92 in sitting  Knee extension Lacking 4    Ankle dorsiflexion     Ankle plantarflexion     Ankle inversion     Ankle eversion      (Blank rows = not tested)  LOWER EXTREMITY MMT: MMT Right eval Left eval  Hip flexion 3- 5  Hip extension    Hip abduction    Hip adduction    Hip internal rotation    Hip external rotation    Knee flexion 4- 5  Knee extension 4- 5  Ankle dorsiflexion 3- 5  Ankle plantarflexion 3+ in sitting 5  Ankle inversion 3- 5  Ankle eversion 3- 5   (Blank rows = not tested)  FUNCTIONAL TESTS:  Sit to stand: maintains Rt knee in extension; significant use of BUE  Stair negotiation: reciprocal stair negotiation with ascent; step to descent   GAIT: Distance walked: 20 ft  Assistive device utilized: None Level of assistance: Complete Independence Comments: circumduction RLE, decreased knee flexion RLE, Rt foot ER, lateral trunk lean   [facility] Adult PT Treatment:                                                DATE: 11/17/2023 Therapeutic Exercise: Recumbent bike mini revolutions with assist to place R leg on pedal x 5 min Long sitting: Gastroc stretch with strap Quad set (towel behind knee) Seated on airex:  AROM heel slides with slider x10  LAQ (R) x10 Clamshells + GTB x10 --> unilateral clamshell x10 (B) Walking with cane Staggered stance fwd weight shifting on R LE Standing: HS curls 2x10 Side stepping Hip abduction 2x10  Gastroc stretch (R) Passive knee flexion (supine)    [facility] Adult PT Treatment:                                                DATE: 11/15/2023 Therapeutic Exercise: Recumbent bike mini revolutions with assist to place R leg on pedal Long-sitting: Assisted heel slide up + AROM knee extension Supine: AAROM heel slides for flexion with strap assist (due to R  hip flexor weakness) Quad set with deflated coregous ball under knee x 10 reps Seated: Hamstring isometric --> pressing into orange PB AROM heel slides with slider x10 Isometric knee extension x10  Standing:  Gastroc stretch Hip abd 2x10 (R) Marching 2x10  Hip ext 2x10 (R) Therapeutic Activity: Gait training with SPC (adjusted for proper height)  Staggered stance weight shifting + step fwd/bkwd   [facility] Adult PT Treatment:  DATE: 11/12/23 Therapeutic Exercise: Recumbent bike mini revolutions with assist to place R leg on pedal Supine Assisted heel slides (due to R hip flexor weakness) R LE on/off edge of mat with assist-- painful in ITB Quad set with ball under coregous ball (deflated) x 10 reps Hamstring isometrics into physioball x 5 reps with 5 second holds Standing Forward/backward walking near countertop x 10 feet Sidestepping x 10 feet x 6 reps Marching x 10 reps Foot in stride position with focus on R knee flexion Gastroc stretch Seated Isometric knee extension x 10 reps  Knee flexion x 3 reps for AAROM Manual Therapy: IASTM IT Band R side to tolerance Gait: Emphasis on gait mechanics Forward/backwards walking Recommended SPC to help normalize gait mechanics for the R knee                                                                                                                              PATIENT EDUCATION:  Education details: updated HEP Person educated: Patient and Child(ren) Education method: Explanation, Demonstration, Tactile cues, Verbal cues, and Handouts Education comprehension: verbalized understanding, returned demonstration, verbal cues required, tactile cues required, and needs further education  HOME EXERCISE PROGRAM: Access Code: [email or web address] URL: [email or web address] Date: 11/08/2023 Prepared by: Carlynn Herald  Exercises - Supine Quad Set  - 1 x daily - 7 x weekly - 2 sets - 10  reps - 5 sec hold - Seated Knee Flexion AAROM  - 1 x daily - 7 x weekly - 1 sets - 10 reps - 5 sec  hold - Seated Active Assistive Knee Flexion and Extension  - 1 x daily - 7 x weekly - 1 sets - 10 reps - 5 sec  hold - Standing Terminal Knee Extension with Resistance  - 1 x daily - 7 x weekly - 2 sets - 10 reps - Sit to Stand with Armchair  - 1 x daily - 7 x weekly - 2 sets - 10 reps - Long Sitting 4 Way Patellar Glide  - 1 x daily - 7 x weekly - 3 minutes  hold - Standing Knee Flexion  - 1 x daily - 7 x weekly - 3 sets - 10 reps - Supine Knee Extension Strengthening  - 1 x daily - 7 x weekly - 3 sets - 10 reps - 5 sec hold - Standing Hip Abduction with Counter Support  - 1 x daily - 7 x weekly - 3 sets - 10 reps  ASSESSMENT:  CLINICAL IMPRESSION: Passive knee flexion stretching continued with mild pain at end range. Recommended patient continue focusing on assisted heel slides to progress AROM knee flexion. Standing LE strengthening exercises progressed; fatigue in standing leg noted during hip abduction exercises. Requested patient bring lace-up sneakers next visit to trial foot drop brace.   Patient is a 48 y.o. female who was seen today for physical therapy evaluation and treatment for s/p Rt knee arthroscopy, debridement of osteochondral lesion  and partial lateral meniscectomy on 09/13/23. She also has a history of MS reporting balance, strength and coordination deficits on the RLE. Upon assessment she is noted to have limited Rt knee ROM, RLE weakness, difficulty with transfers and stair negotiation, and gait abnormalities. She will benefit from skilled PT to address the above stated deficits in order to optimize her function and assist with pain reduction.   OBJECTIVE IMPAIRMENTS: Abnormal gait, decreased activity tolerance, decreased balance, decreased coordination, difficulty walking, decreased ROM, decreased strength, hypomobility, impaired sensation, improper body mechanics, postural  dysfunction, and pain.   GOALS: Goals reviewed with patient? Yes  SHORT TERM GOALS: Target date: 12/01/2023  Patient will be independent and compliant with initial HEP.  Baseline: issued at eval  Goal status: INITIAL  2.  Patient will perform sit to stand without UE support to improve ease of transfers.  Baseline: see above  Goal status: INITIAL  3.  Patient will demonstrate at least 100 degrees of Rt knee flexion AROM to improve gait mechanics.  Baseline: see above  Goal status: INITIAL   LONG TERM GOALS: Target date: 01/01/24  Patient will be able to descend stairs with reciprocal pattern with handrail support.  Baseline: step to  Goal status: INITIAL  2.  Patient will demonstrate full Rt knee extension to improve gait mechanics.  Baseline: see above  Goal status: INITIAL  3.  Patient will return to dance activity without limitation as it relates to her knee.  Baseline: currently using brace and avoiding twisting motions.  Goal status: INITIAL  4.  Patient will be independent with advanced home program to progress/maintain current level of function.  Baseline: initial HEP issued  Goal status: INITIAL  PLAN:  PT FREQUENCY: 1-2x/week  PT DURATION: 8 weeks  PLANNED INTERVENTIONS: 97164- PT Re-evaluation, 97110-Therapeutic exercises, 97530- Therapeutic activity, 97112- Neuromuscular re-education, 97535- Self Care, 16109- Manual therapy, [id number]- Gait training, 97014- Electrical stimulation (unattended), [id number]- Electrical stimulation (manual), 97016- Vasopneumatic device, Dry Needling, Joint mobilization, and Cryotherapy  PLAN FOR NEXT SESSION: review/progress HEP prn; work on knee ROM, sit to stand transfers; Youth worker. Gait training.    Sanjuana Mae, PTA 11/17/23 2:54 PM

## 2023-11-23 ENCOUNTER — Ambulatory Visit: Payer: Federal, State, Local not specified - PPO

## 2023-11-23 DIAGNOSIS — M25561 Pain in right knee: Secondary | ICD-10-CM

## 2023-11-23 DIAGNOSIS — R2689 Other abnormalities of gait and mobility: Secondary | ICD-10-CM | POA: Diagnosis not present

## 2023-11-23 DIAGNOSIS — M6281 Muscle weakness (generalized): Secondary | ICD-10-CM
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
## 2023-11-25 ENCOUNTER — Ambulatory Visit: Payer: Federal, State, Local not specified - PPO

## 2023-11-25 DIAGNOSIS — R2689 Other abnormalities of gait and mobility: Secondary | ICD-10-CM | POA: Diagnosis not present

## 2023-11-25 DIAGNOSIS — M25561 Pain in right knee: Secondary | ICD-10-CM

## 2023-11-25 DIAGNOSIS — M6281 Muscle weakness (generalized): Secondary | ICD-10-CM

## 2023-11-25 NOTE — Therapy (Signed)
OUTPATIENT PHYSICAL THERAPY LOWER EXTREMITY TREATMENT   Patient Name: Karen Oconnor MRN: [id number] DOB:03-31-1976, 48 y.o., female Today's Date: 11/25/2023  END OF SESSION:  PT End of Session - 11/25/23 1455     Visit Number 7    Number of Visits 17    Date for PT Re-Evaluation 01/01/24    Authorization Type BCBS    PT Start Time 1455   patient late   PT Stop Time 1530    PT Time Calculation (min) 35 min    Activity Tolerance Patient tolerated treatment well    Behavior During Therapy WFL for tasks assessed/performed              Past Medical History:  Diagnosis Date   Diabetes (HCC)    MS (multiple sclerosis) (HCC)    Past Surgical History:  Procedure Laterality Date   ABDOMINAL HYSTERECTOMY  2016   Patient Active Problem List   Diagnosis Date Noted   Lateral meniscal tear 09/10/2023   Pain of knee joint with osteochondral injury 09/10/2023   Transverse myelitis (HCC) 02/25/2022   Multiple sclerosis (HCC) 02/25/2022   Right foot drop 02/25/2022   High risk medication use 02/25/2022   Allodynia 02/25/2022    PCP: Harvie Heck, MD  REFERRING PROVIDER: Eldred Manges, MD   REFERRING DIAG: [phone] (ICD-10-CM) - Other tear of lateral meniscus of right knee, unspecified whether old or current tear, sequela   THERAPY DIAG:  Acute pain of right knee  Muscle weakness (generalized)  Other abnormalities of gait and mobility  Rationale for Evaluation and Treatment: Rehabilitation  ONSET DATE: 09/13/23  SUBJECTIVE:   SUBJECTIVE STATEMENT: Patient reports the knee feels better than last session. Wants to be able to go down the stairs with reciprocal pattern.   EVAL: Patient underwent knee surgery in November and has been doing exercises at home, but needs more help in how to progress her ROM and strength. The pain is better since the surgery. Still has pain in the knee if she twists and when she goes down the stairs. Patient was diagnosed with MS in 2013 and  feels her balance, coordination, and strength is effected on the Rt side. She does have spasticity in the calf. She also has neuropathy in both feet.   PERTINENT HISTORY: MS PAIN:  Are you having pain? Yes: NPRS scale: none currently; at worst 2/10 Pain location: Rt anterior knee Pain description: tender Aggravating factors: stair descent, twisting, excessive flexion  Relieving factors: straightening the knee   PRECAUTIONS: Fall  WEIGHT BEARING RESTRICTIONS: No  FALLS:  Has patient fallen in last 6 months? Yes. Number of falls 2; tripped over grate and fell, LOB and fell  LIVING ENVIRONMENT: Lives with: lives with their daughter Lives in: House/apartment Stairs: Yes: Internal: 2 x 7 steps; on left going up and External: 7 steps; can reach both Has following equipment at home: Single point cane, Quad cane small base, and Walker - 2 wheeled  OCCUPATION: work from home   PATIENT GOALS: "get the full ROM, guidance in exercise."   OBJECTIVE:  Note: Objective measures were completed at Evaluation unless otherwise noted.  DIAGNOSTIC FINDINGS:  Rt knee MRI: IMPRESSION: 1. Small radial tear of the lateral meniscus midbody. 2. 1.7 cm unstable osteochondral lesion of the peripheral lateral femoral condyle. 3. 4.1 cm synovial mass in the lateral joint space adjacent to the patella, possibly focal nodular synovitis. 4. 2.3 cm multiloculated cyst along the medial joint line could represent a parameniscal cyst  from occult meniscal tear or ganglion cyst. 5. Mild tricompartmental osteoarthritis.  COGNITION: Overall cognitive status: Within functional limits for tasks assessed     SENSATION: Not tested; patient endorses bilateral neuropathy   EDEMA:  Unable to inspect knee due to clothing  POSTURE: weight shifts Lt, maintains RLE abducted with foot ER.   PALPATION: TTP lateral aspect of Rt knee   LOWER EXTREMITY ROM: Active ROM Right eval Left eval Right 11/12/23  11/23/23 Right   Hip flexion      Hip extension      Hip abduction      Hip adduction      Hip internal rotation      Hip external rotation      Knee flexion 85 in sitting  92 in sitting 96 in sitting   Knee extension Lacking 4     Ankle dorsiflexion      Ankle plantarflexion      Ankle inversion      Ankle eversion       (Blank rows = not tested)  LOWER EXTREMITY MMT: MMT Right eval Left eval  Hip flexion 3- 5  Hip extension    Hip abduction    Hip adduction    Hip internal rotation    Hip external rotation    Knee flexion 4- 5  Knee extension 4- 5  Ankle dorsiflexion 3- 5  Ankle plantarflexion 3+ in sitting 5  Ankle inversion 3- 5  Ankle eversion 3- 5   (Blank rows = not tested)  FUNCTIONAL TESTS:  Sit to stand: maintains Rt knee in extension; significant use of BUE  Stair negotiation: reciprocal stair negotiation with ascent; step to descent   GAIT: Distance walked: 20 ft  Assistive device utilized: None Level of assistance: Complete Independence Comments: circumduction RLE, decreased knee flexion RLE, Rt foot ER, lateral trunk lean  [facility] Adult PT Treatment:                                                DATE: 11/25/23 Therapeutic Exercise: LAQ 2 x 10 @ 1 lb  Step ups 6 inch step x 10  HEP review   Therapeutic Activity: Sit to stand from raised height multiple reps holding orange ball with mirror for visual feedback    [facility] Adult PT Treatment:                                                DATE: 11/23/23 Therapeutic Exercise: HS curl seated green band 2 x 10  Lateral step down 2 inch attempted unable due to LOB even with UE support Eccentric leg press 2 x 8, 40 lbs Walking in clinic with OSSUR footup brace and SPC  Self Care: Trial of Ossur footup brace with recommendations on where to purchase  Discussed appropriate gym equipment to utilize   [facility] Adult PT Treatment:                                                DATE: 11/17/2023 Therapeutic  Exercise: Recumbent bike mini revolutions with assist to place R leg  on pedal x 5 min Long sitting: Gastroc stretch with strap Quad set (towel behind knee) Seated on airex:  AROM heel slides with slider x10  LAQ (R) x10 Clamshells + GTB x10 --> unilateral clamshell x10 (B) Walking with cane Staggered stance fwd weight shifting on R LE Standing: HS curls 2x10 Side stepping Hip abduction 2x10  Gastroc stretch (R) Passive knee flexion (supine)    [facility] Adult PT Treatment:                                                DATE: 11/15/2023 Therapeutic Exercise: Recumbent bike mini revolutions with assist to place R leg on pedal Long-sitting: Assisted heel slide up + AROM knee extension Supine: AAROM heel slides for flexion with strap assist (due to R hip flexor weakness) Quad set with deflated coregous ball under knee x 10 reps Seated: Hamstring isometric --> pressing into orange PB AROM heel slides with slider x10 Isometric knee extension x10  Standing:  Gastroc stretch Hip abd 2x10 (R) Marching 2x10  Hip ext 2x10 (R) Therapeutic Activity: Gait training with SPC (adjusted for proper height)  Staggered stance weight shifting + step fwd/bkwd                                                                                                                              PATIENT EDUCATION:  Education details: HEP review; see treatment  Person educated: Patient Education method: Explanation and Handouts Education comprehension: verbalized understanding  HOME EXERCISE PROGRAM: Access Code: [email or web address] URL: [email or web address] Date: 11/08/2023 Prepared by: Carlynn Herald  Exercises - Supine Quad Set  - 1 x daily - 7 x weekly - 2 sets - 10 reps - 5 sec hold - Seated Knee Flexion AAROM  - 1 x daily - 7 x weekly - 1 sets - 10 reps - 5 sec  hold - Seated Active Assistive Knee Flexion and Extension  - 1 x daily - 7 x weekly - 1 sets - 10 reps - 5 sec  hold - Standing  Terminal Knee Extension with Resistance  - 1 x daily - 7 x weekly - 2 sets - 10 reps - Sit to Stand with Armchair  - 1 x daily - 7 x weekly - 2 sets - 10 reps - Long Sitting 4 Way Patellar Glide  - 1 x daily - 7 x weekly - 3 minutes  hold - Standing Knee Flexion  - 1 x daily - 7 x weekly - 3 sets - 10 reps - Supine Knee Extension Strengthening  - 1 x daily - 7 x weekly - 3 sets - 10 reps - 5 sec hold - Standing Hip Abduction with Counter Support  - 1 x daily - 7 x weekly - 3 sets - 10 reps  ASSESSMENT:  CLINICAL IMPRESSION: Heavy  emphasis on sit to stand mechanics today with good tolerance. With continued practice she is better able to weightshift onto the RLE during ascent/descent and maintain proper foot alignment without use of UE support from raised height with sit to stands. Mild discomfort in the knee initially with sit to stands, but improved with continued reps.   Patient is a 48 y.o. female who was seen today for physical therapy evaluation and treatment for s/p Rt knee arthroscopy, debridement of osteochondral lesion and partial lateral meniscectomy on 09/13/23. She also has a history of MS reporting balance, strength and coordination deficits on the RLE. Upon assessment she is noted to have limited Rt knee ROM, RLE weakness, difficulty with transfers and stair negotiation, and gait abnormalities. She will benefit from skilled PT to address the above stated deficits in order to optimize her function and assist with pain reduction.   OBJECTIVE IMPAIRMENTS: Abnormal gait, decreased activity tolerance, decreased balance, decreased coordination, difficulty walking, decreased ROM, decreased strength, hypomobility, impaired sensation, improper body mechanics, postural dysfunction, and pain.   GOALS: Goals reviewed with patient? Yes  SHORT TERM GOALS: Target date: 12/01/2023  Patient will be independent and compliant with initial HEP.  Baseline: issued at eval  Goal status: MET  2.  Patient  will perform sit to stand without UE support to improve ease of transfers.  Baseline: see above  11/23/23: requires use of UE support due to pain/weakness  Goal status: ongoing   3.  Patient will demonstrate at least 100 degrees of Rt knee flexion AROM to improve gait mechanics.  Baseline: see above  Goal status: progressing    LONG TERM GOALS: Target date: 01/01/24  Patient will be able to descend stairs with reciprocal pattern with handrail support.  Baseline: step to  Goal status: INITIAL  2.  Patient will demonstrate full Rt knee extension to improve gait mechanics.  Baseline: see above  Goal status: INITIAL  3.  Patient will return to dance activity without limitation as it relates to her knee.  Baseline: currently using brace and avoiding twisting motions.  Goal status: INITIAL  4.  Patient will be independent with advanced home program to progress/maintain current level of function.  Baseline: initial HEP issued  Goal status: INITIAL  PLAN:  PT FREQUENCY: 1-2x/week  PT DURATION: 8 weeks  PLANNED INTERVENTIONS: 97164- PT Re-evaluation, 97110-Therapeutic exercises, 97530- Therapeutic activity, 97112- Neuromuscular re-education, 97535- Self Care, 16109- Manual therapy, [id number]- Gait training, 97014- Electrical stimulation (unattended), [id number]- Electrical stimulation (manual), 97016- Vasopneumatic device, Dry Needling, Joint mobilization, and Cryotherapy  PLAN FOR NEXT SESSION: review/progress HEP prn; work on knee ROM, sit to stand transfers; Youth worker. Gait training. Stair negotiation   Letitia Libra, PT, DPT, ATC 11/25/23 3:32 PM

## 2023-12-01 ENCOUNTER — Ambulatory Visit: Payer: Federal, State, Local not specified - PPO | Attending: Orthopaedic Surgery

## 2023-12-01 DIAGNOSIS — M25561 Pain in right knee: Secondary | ICD-10-CM | POA: Diagnosis present

## 2023-12-01 DIAGNOSIS — R2681 Unsteadiness on feet: Secondary | ICD-10-CM | POA: Insufficient documentation

## 2023-12-01 DIAGNOSIS — R2689 Other abnormalities of gait and mobility: Secondary | ICD-10-CM | POA: Insufficient documentation

## 2023-12-01 DIAGNOSIS — M6281 Muscle weakness (generalized): Secondary | ICD-10-CM | POA: Insufficient documentation

## 2023-12-01 NOTE — Therapy (Signed)
 OUTPATIENT PHYSICAL THERAPY LOWER EXTREMITY TREATMENT   Patient Name: Karen Oconnor MRN: [id number] DOB:[birth year], 48 y.o., female Today's Date: 12/01/2023  END OF SESSION:  PT End of Session - 12/01/23 0934     Visit Number 8    Number of Visits 17    Date for PT Re-Evaluation 01/01/24    Authorization Type BCBS    PT Start Time 0933    PT Stop Time 1015    PT Time Calculation (min) 42 min    Activity Tolerance Patient tolerated treatment well    Behavior During Therapy [facility] for tasks assessed/performed            Past Medical History:  Diagnosis Date   Diabetes (HCC)    MS (multiple sclerosis) (HCC)    Past Surgical History:  Procedure Laterality Date   ABDOMINAL HYSTERECTOMY  2016   Patient Active Problem List   Diagnosis Date Noted   Lateral meniscal tear 09/10/2023   Pain of knee joint with osteochondral injury 09/10/2023   Transverse myelitis (HCC) 02/25/2022   Multiple sclerosis (HCC) 02/25/2022   Right foot drop 02/25/2022   High risk medication use 02/25/2022   Allodynia 02/25/2022    PCP: Leonce Sink, MD  REFERRING PROVIDER: Barbarann Oneil BROCKS, MD   REFERRING DIAG: [phone] (ICD-10-CM) - Other tear of lateral meniscus of right knee, unspecified whether old or current tear, sequela   THERAPY DIAG:  Acute pain of right knee  Muscle weakness (generalized)  Other abnormalities of gait and mobility  Unsteadiness on feet  Rationale for Evaluation and Treatment: Rehabilitation  ONSET DATE: 09/13/23  SUBJECTIVE:   SUBJECTIVE STATEMENT: Patient reports she has been having some good knee days. Patient states she was able to walk down with reciprocal stepping; states she has pain at lateral knee with too much knee flexion.  EVAL: Patient underwent knee surgery in November and has been doing exercises at home, but needs more help in how to progress her ROM and strength. The pain is better since the surgery. Still has pain in the knee if she twists  and when she goes down the stairs. Patient was diagnosed with MS in 2013 and feels her balance, coordination, and strength is effected on the Rt side. She does have spasticity in the calf. She also has neuropathy in both feet.   PERTINENT HISTORY: MS PAIN:  Are you having pain? Yes: NPRS scale: none currently; at worst 2/10 Pain location: Rt anterior knee Pain description: tender Aggravating factors: stair descent, twisting, excessive flexion  Relieving factors: straightening the knee   PRECAUTIONS: Fall  WEIGHT BEARING RESTRICTIONS: No  FALLS:  Has patient fallen in last 6 months? Yes. Number of falls 2; tripped over grate and fell, LOB and fell  LIVING ENVIRONMENT: Lives with: lives with their daughter Lives in: House/apartment Stairs: Yes: Internal: 2 x 7 steps; on left going up and External: 7 steps; can reach both Has following equipment at home: Single point cane, Quad cane small base, and Walker - 2 wheeled  OCCUPATION: work from home   PATIENT GOALS: get the full ROM, guidance in exercise.   OBJECTIVE:  Note: Objective measures were completed at Evaluation unless otherwise noted.  DIAGNOSTIC FINDINGS:  Rt knee MRI: IMPRESSION: 1. Small radial tear of the lateral meniscus midbody. 2. 1.7 cm unstable osteochondral lesion of the peripheral lateral femoral condyle. 3. 4.1 cm synovial mass in the lateral joint space adjacent to the patella, possibly focal nodular synovitis. 4. 2.3 cm multiloculated cyst  along the medial joint line could represent a parameniscal cyst from occult meniscal tear or ganglion cyst. 5. Mild tricompartmental osteoarthritis.  COGNITION: Overall cognitive status: Within functional limits for tasks assessed     SENSATION: Not tested; patient endorses bilateral neuropathy   EDEMA:  Unable to inspect knee due to clothing  POSTURE: weight shifts Lt, maintains RLE abducted with foot ER.   PALPATION: TTP lateral aspect of Rt knee    LOWER EXTREMITY ROM: Active ROM Right eval Left eval Right 11/12/23 11/23/23 Right   Hip flexion      Hip extension      Hip abduction      Hip adduction      Hip internal rotation      Hip external rotation      Knee flexion 85 in sitting  92 in sitting 96 in sitting   Knee extension Lacking 4     Ankle dorsiflexion      Ankle plantarflexion      Ankle inversion      Ankle eversion       (Blank rows = not tested)  LOWER EXTREMITY MMT: MMT Right eval Left eval  Hip flexion 3- 5  Hip extension    Hip abduction    Hip adduction    Hip internal rotation    Hip external rotation    Knee flexion 4- 5  Knee extension 4- 5  Ankle dorsiflexion 3- 5  Ankle plantarflexion 3+ in sitting 5  Ankle inversion 3- 5  Ankle eversion 3- 5   (Blank rows = not tested)  FUNCTIONAL TESTS:  Sit to stand: maintains Rt knee in extension; significant use of BUE  Stair negotiation: reciprocal stair negotiation with ascent; step to descent   GAIT: Distance walked: 20 ft  Assistive device utilized: None Level of assistance: Complete Independence Comments: circumduction RLE, decreased knee flexion RLE, Rt foot ER, lateral trunk lean  [facility] Adult PT Treatment:                                                DATE: 12/01/2023 Therapeutic Exercise: Recumbent bike partial revolutions x 3 min LAQ (R) 1.5#AW 2x15 Staggered stance STS on airex  Stairs: 2 --> 4 step down leading with L leg Reciprocal stepping down 4 --> 6 steps Lateral step up 2 --> 4 step Eccentric leg press 40# 2x10 Standing (R) hip abd, ext, HS curls x 15 each   [facility] Adult PT Treatment:                                                DATE: 11/25/23 Therapeutic Exercise: LAQ 2 x 10 @ 1 lb  Step ups 6 inch step x 10  HEP review   Therapeutic Activity: Sit to stand from raised height multiple reps holding orange ball with mirror for visual feedback    [facility] Adult PT Treatment:                                                 DATE: 11/23/23 Therapeutic Exercise: HS curl seated green band  2 x 10  Lateral step down 2 inch attempted unable due to LOB even with UE support Eccentric leg press 2 x 8, 40 lbs Walking in clinic with OSSUR footup brace and SPC  Self Care: Trial of Ossur footup brace with recommendations on where to purchase  Discussed appropriate gym equipment to utilize   [facility] Adult PT Treatment:                                                DATE: 11/17/2023 Therapeutic Exercise: Recumbent bike mini revolutions with assist to place R leg on pedal x 5 min Long sitting: Gastroc stretch with strap Quad set (towel behind knee) Seated on airex:  AROM heel slides with slider x10  LAQ (R) x10 Clamshells + GTB x10 --> unilateral clamshell x10 (B) Walking with cane Staggered stance fwd weight shifting on R LE Standing: HS curls 2x10 Side stepping Hip abduction 2x10  Gastroc stretch (R) Passive knee flexion (supine)                                                                                                                              PATIENT EDUCATION:  Education details: HEP review; see treatment  Person educated: Patient Education method: Explanation and Handouts Education comprehension: verbalized understanding  HOME EXERCISE PROGRAM: Access Code: [email or web address] URL: [email or web address] Date: 11/08/2023 Prepared by: Lamarr Price  Exercises - Supine Quad Set  - 1 x daily - 7 x weekly - 2 sets - 10 reps - 5 sec hold - Seated Knee Flexion AAROM  - 1 x daily - 7 x weekly - 1 sets - 10 reps - 5 sec  hold - Seated Active Assistive Knee Flexion and Extension  - 1 x daily - 7 x weekly - 1 sets - 10 reps - 5 sec  hold - Standing Terminal Knee Extension with Resistance  - 1 x daily - 7 x weekly - 2 sets - 10 reps - Sit to Stand with Armchair  - 1 x daily - 7 x weekly - 2 sets - 10 reps - Long Sitting 4 Way Patellar Glide  - 1 x daily - 7 x weekly - 3 minutes  hold - Standing  Knee Flexion  - 1 x daily - 7 x weekly - 3 sets - 10 reps - Supine Knee Extension Strengthening  - 1 x daily - 7 x weekly - 3 sets - 10 reps - 5 sec hold - Standing Hip Abduction with Counter Support  - 1 x daily - 7 x weekly - 3 sets - 10 reps  ASSESSMENT:  CLINICAL IMPRESSION: Step down on stairs progressed with gradual increase in step height. Eccentric presses continued on leg press, demonstrating good control. Staggered stance cued during sit to stand to increase weight bearing on R LE. Patient  continues to have tightness at lateral knee with flexion.   Patient is a 48 y.o. female who was seen today for physical therapy evaluation and treatment for s/p Rt knee arthroscopy, debridement of osteochondral lesion and partial lateral meniscectomy on 09/13/23. She also has a history of MS reporting balance, strength and coordination deficits on the RLE. Upon assessment she is noted to have limited Rt knee ROM, RLE weakness, difficulty with transfers and stair negotiation, and gait abnormalities. She will benefit from skilled PT to address the above stated deficits in order to optimize her function and assist with pain reduction.   OBJECTIVE IMPAIRMENTS: Abnormal gait, decreased activity tolerance, decreased balance, decreased coordination, difficulty walking, decreased ROM, decreased strength, hypomobility, impaired sensation, improper body mechanics, postural dysfunction, and pain.   GOALS: Goals reviewed with patient? Yes  SHORT TERM GOALS: Target date: 12/01/2023  Patient will be independent and compliant with initial HEP.  Baseline: issued at eval  Goal status: MET  2.  Patient will perform sit to stand without UE support to improve ease of transfers.  Baseline: see above  11/23/23: requires use of UE support due to pain/weakness  Goal status: ongoing   3.  Patient will demonstrate at least 100 degrees of Rt knee flexion AROM to improve gait mechanics.  Baseline: see above  Goal status:  progressing    LONG TERM GOALS: Target date: 01/01/24  Patient will be able to descend stairs with reciprocal pattern with handrail support.  Baseline: step to  Goal status: INITIAL  2.  Patient will demonstrate full Rt knee extension to improve gait mechanics.  Baseline: see above  Goal status: INITIAL  3.  Patient will return to dance activity without limitation as it relates to her knee.  Baseline: currently using brace and avoiding twisting motions.  Goal status: INITIAL  4.  Patient will be independent with advanced home program to progress/maintain current level of function.  Baseline: initial HEP issued  Goal status: INITIAL  PLAN:  PT FREQUENCY: 1-2x/week  PT DURATION: 8 weeks  PLANNED INTERVENTIONS: 97164- PT Re-evaluation, 97110-Therapeutic exercises, 97530- Therapeutic activity, [id number]- Neuromuscular re-education, 97535- Self Care, [id number]- Manual therapy, [id number]- Gait training, 97014- Electrical stimulation (unattended), [id number]- Electrical stimulation (manual), 97016- Vasopneumatic device, Dry Needling, Joint mobilization, and Cryotherapy  PLAN FOR NEXT SESSION: Work on knee ROM, sit to stand transfers; youth worker. Gait training. Stair negotiation - reciprocal step down  Lamarr Price, PTA 12/01/23 10:18 AM

## 2023-12-03 ENCOUNTER — Ambulatory Visit: Payer: Federal, State, Local not specified - PPO

## 2023-12-03 DIAGNOSIS — R2689 Other abnormalities of gait and mobility: Secondary | ICD-10-CM

## 2023-12-03 DIAGNOSIS — M25561 Pain in right knee: Secondary | ICD-10-CM

## 2023-12-03 DIAGNOSIS — R2681 Unsteadiness on feet: Secondary | ICD-10-CM

## 2023-12-03 DIAGNOSIS — M6281 Muscle weakness (generalized): Secondary | ICD-10-CM

## 2023-12-03 NOTE — Therapy (Signed)
 OUTPATIENT PHYSICAL THERAPY LOWER EXTREMITY TREATMENT   Patient Name: Karen Oconnor MRN: [id number] DOB:08-21-1976, 48 y.o., female Today's Date: 12/03/2023  END OF SESSION:  PT End of Session - 12/03/23 0935     Visit Number 9    Number of Visits 17    Date for PT Re-Evaluation 01/01/24    Authorization Type BCBS    PT Start Time 0935    PT Stop Time 1016    PT Time Calculation (min) 41 min    Activity Tolerance Patient tolerated treatment well    Behavior During Therapy [facility] for tasks assessed/performed            Past Medical History:  Diagnosis Date   Diabetes (HCC)    MS (multiple sclerosis) (HCC)    Past Surgical History:  Procedure Laterality Date   ABDOMINAL HYSTERECTOMY  2016   Patient Active Problem List   Diagnosis Date Noted   Lateral meniscal tear 09/10/2023   Pain of knee joint with osteochondral injury 09/10/2023   Transverse myelitis (HCC) 02/25/2022   Multiple sclerosis (HCC) 02/25/2022   Right foot drop 02/25/2022   High risk medication use 02/25/2022   Allodynia 02/25/2022    PCP: Leonce Sink, MD  REFERRING PROVIDER: Barbarann Oneil BROCKS, MD   REFERRING DIAG: [phone] (ICD-10-CM) - Other tear of lateral meniscus of right knee, unspecified whether old or current tear, sequela   THERAPY DIAG:  Acute pain of right knee  Muscle weakness (generalized)  Other abnormalities of gait and mobility  Unsteadiness on feet  Rationale for Evaluation and Treatment: Rehabilitation  ONSET DATE: 09/13/23  SUBJECTIVE:   SUBJECTIVE STATEMENT: Patient reports she is able to walk down stairs much better. Patient reports mild pain at lateral knee but not as much tightness.  EVAL: Patient underwent knee surgery in November and has been doing exercises at home, but needs more help in how to progress her ROM and strength. The pain is better since the surgery. Still has pain in the knee if she twists and when she goes down the stairs. Patient was diagnosed with MS  in 2013 and feels her balance, coordination, and strength is effected on the Rt side. She does have spasticity in the calf. She also has neuropathy in both feet.   PERTINENT HISTORY: MS PAIN:  Are you having pain? Yes: NPRS scale: none currently; at worst 2/10 Pain location: Rt anterior knee Pain description: tender Aggravating factors: stair descent, twisting, excessive flexion  Relieving factors: straightening the knee   PRECAUTIONS: Fall  WEIGHT BEARING RESTRICTIONS: No  FALLS:  Has patient fallen in last 6 months? Yes. Number of falls 2; tripped over grate and fell, LOB and fell  LIVING ENVIRONMENT: Lives with: lives with their daughter Lives in: House/apartment Stairs: Yes: Internal: 2 x 7 steps; on left going up and External: 7 steps; can reach both Has following equipment at home: Single point cane, Quad cane small base, and Walker - 2 wheeled  OCCUPATION: work from home   PATIENT GOALS: get the full ROM, guidance in exercise.   OBJECTIVE:  Note: Objective measures were completed at Evaluation unless otherwise noted.  DIAGNOSTIC FINDINGS:  Rt knee MRI: IMPRESSION: 1. Small radial tear of the lateral meniscus midbody. 2. 1.7 cm unstable osteochondral lesion of the peripheral lateral femoral condyle. 3. 4.1 cm synovial mass in the lateral joint space adjacent to the patella, possibly focal nodular synovitis. 4. 2.3 cm multiloculated cyst along the medial joint line could represent a parameniscal cyst  from occult meniscal tear or ganglion cyst. 5. Mild tricompartmental osteoarthritis.  COGNITION: Overall cognitive status: Within functional limits for tasks assessed     SENSATION: Not tested; patient endorses bilateral neuropathy   EDEMA:  Unable to inspect knee due to clothing  POSTURE: weight shifts Lt, maintains RLE abducted with foot ER.   PALPATION: TTP lateral aspect of Rt knee   LOWER EXTREMITY ROM: Active ROM Right eval Left eval  Right 11/12/23 11/23/23 Right   Hip flexion      Hip extension      Hip abduction      Hip adduction      Hip internal rotation      Hip external rotation      Knee flexion 85 in sitting  92 in sitting 96 in sitting   Knee extension Lacking 4     Ankle dorsiflexion      Ankle plantarflexion      Ankle inversion      Ankle eversion       (Blank rows = not tested)  LOWER EXTREMITY MMT: MMT Right eval Left eval  Hip flexion 3- 5  Hip extension    Hip abduction    Hip adduction    Hip internal rotation    Hip external rotation    Knee flexion 4- 5  Knee extension 4- 5  Ankle dorsiflexion 3- 5  Ankle plantarflexion 3+ in sitting 5  Ankle inversion 3- 5  Ankle eversion 3- 5   (Blank rows = not tested)  FUNCTIONAL TESTS:  Sit to stand: maintains Rt knee in extension; significant use of BUE  Stair negotiation: reciprocal stair negotiation with ascent; step to descent   GAIT: Distance walked: 20 ft  Assistive device utilized: None Level of assistance: Complete Independence Comments: circumduction RLE, decreased knee flexion RLE, Rt foot ER, lateral trunk lean   [facility] Adult PT Treatment:                                                DATE: 12/03/2023 Therapeutic Exercise: Recumbent bike L1 x 5 min (R) high knee step over yoga block --> front & lateral Heel raises 2x10 Quadruped: (R) hip extension (bent knee) x10 (R) hydrant x10 Small range rock back 10x5 Prone hip extension (straight leg) x10  LAQ (R) 2#AW 2x10 Eccentric leg press 40# 2x12 Self Care: Using pedaler at home  Patella self-mobs    [facility] Adult PT Treatment:                                                DATE: 12/01/2023 Therapeutic Exercise: Recumbent bike partial revolutions x 3 min LAQ (R) 1.5#AW 2x15 Staggered stance STS on airex  Stairs: 2 --> 4 step down leading with L leg Reciprocal stepping down 4 --> 6 steps Lateral step up 2 --> 4 step Eccentric leg press 40# 2x10 Standing (R) hip  abd, ext, HS curls x 15 each   [facility] Adult PT Treatment:  DATE: 11/25/23 Therapeutic Exercise: LAQ 2 x 10 @ 1 lb  Step ups 6 inch step x 10  HEP review   Therapeutic Activity: Sit to stand from raised height multiple reps holding orange ball with mirror for visual feedback    [facility] Adult PT Treatment:                                                DATE: 11/23/23 Therapeutic Exercise: HS curl seated green band 2 x 10  Lateral step down 2 inch attempted unable due to LOB even with UE support Eccentric leg press 2 x 8, 40 lbs Walking in clinic with OSSUR footup brace and SPC  Self Care: Trial of Ossur footup brace with recommendations on where to purchase  Discussed appropriate gym equipment to utilize    [facility] Adult PT Treatment:                                                DATE: 11/17/2023 Therapeutic Exercise: Recumbent bike mini revolutions with assist to place R leg on pedal x 5 min Long sitting: Gastroc stretch with strap Quad set (towel behind knee) Seated on airex:  AROM heel slides with slider x10  LAQ (R) x10 Clamshells + GTB x10 --> unilateral clamshell x10 (B) Walking with cane Staggered stance fwd weight shifting on R LE Standing: HS curls 2x10 Side stepping Hip abduction 2x10  Gastroc stretch (R) Passive knee flexion (supine)                                                                                                                              PATIENT EDUCATION:  Education details: HEP review; see treatment  Person educated: Patient Education method: Explanation and Handouts Education comprehension: verbalized understanding  HOME EXERCISE PROGRAM: Access Code: [email or web address] URL: [email or web address] Date: 12/03/2023 Prepared by: Lamarr Price  Exercises - Supine Quad Set  - 1 x daily - 7 x weekly - 2 sets - 10 reps - 5 sec hold - Seated Knee Flexion AAROM  - 1 x daily - 7 x weekly - 1 sets -  10 reps - 5 sec  hold - Seated Active Assistive Knee Flexion and Extension  - 1 x daily - 7 x weekly - 1 sets - 10 reps - 5 sec  hold - Standing Terminal Knee Extension with Resistance  - 1 x daily - 7 x weekly - 2 sets - 10 reps - Sit to Stand with Armchair  - 1 x daily - 7 x weekly - 2 sets - 10 reps - Long Sitting 4 Way Patellar Glide  - 1 x daily - 7 x weekly - 3 minutes  hold - Standing Knee Flexion  - 1 x daily - 7 x weekly - 3 sets - 10 reps - Supine Knee Extension Strengthening  - 1 x daily - 7 x weekly - 3 sets - 10 reps - 5 sec hold - Standing Hip Abduction with Counter Support  - 1 x daily - 7 x weekly - 3 sets - 10 reps - Quadruped Fire Hydrant  - 1 x daily - 7 x weekly - 3 sets - 10 reps - Quadruped Alternating Leg Extensions  - 1 x daily - 7 x weekly - 3 sets - 10 reps - Quadruped Rock Back (BKA)  - 1 x daily - 7 x weekly - 3 sets - 10 reps  ASSESSMENT:  CLINICAL IMPRESSION: Standing exercises progressed with functional stepping and weight shifting on/off R LE. Quadruped exercises incorporated to challenge core and postural stabilization with hip strengthening; patient tolerated weight bearing on R knee well in quadruped. Eccentric leg press continued and weight increase in resistance with seated knee extension.  Patient is a 48 y.o. female who was seen today for physical therapy evaluation and treatment for s/p Rt knee arthroscopy, debridement of osteochondral lesion and partial lateral meniscectomy on 09/13/23. She also has a history of MS reporting balance, strength and coordination deficits on the RLE. Upon assessment she is noted to have limited Rt knee ROM, RLE weakness, difficulty with transfers and stair negotiation, and gait abnormalities. She will benefit from skilled PT to address the above stated deficits in order to optimize her function and assist with pain reduction.   OBJECTIVE IMPAIRMENTS: Abnormal gait, decreased activity tolerance, decreased balance, decreased  coordination, difficulty walking, decreased ROM, decreased strength, hypomobility, impaired sensation, improper body mechanics, postural dysfunction, and pain.   GOALS: Goals reviewed with patient? Yes  SHORT TERM GOALS: Target date: 12/01/2023  Patient will be independent and compliant with initial HEP.  Baseline: issued at eval  Goal status: MET  2.  Patient will perform sit to stand without UE support to improve ease of transfers.  Baseline: see above  11/23/23: requires use of UE support due to pain/weakness  Goal status: ongoing   3.  Patient will demonstrate at least 100 degrees of Rt knee flexion AROM to improve gait mechanics.  Baseline: see above  Goal status: progressing    LONG TERM GOALS: Target date: 01/01/24  Patient will be able to descend stairs with reciprocal pattern with handrail support.  Baseline: step to  Goal status: INITIAL  2.  Patient will demonstrate full Rt knee extension to improve gait mechanics.  Baseline: see above  Goal status: INITIAL  3.  Patient will return to dance activity without limitation as it relates to her knee.  Baseline: currently using brace and avoiding twisting motions.  Goal status: INITIAL  4.  Patient will be independent with advanced home program to progress/maintain current level of function.  Baseline: initial HEP issued  Goal status: INITIAL  PLAN:  PT FREQUENCY: 1-2x/week  PT DURATION: 8 weeks  PLANNED INTERVENTIONS: 97164- PT Re-evaluation, 97110-Therapeutic exercises, 97530- Therapeutic activity, 97112- Neuromuscular re-education, 97535- Self Care, 02859- Manual therapy, [id number]- Gait training, 97014- Electrical stimulation (unattended), [id number]- Electrical stimulation (manual), 97016- Vasopneumatic device, Dry Needling, Joint mobilization, and Cryotherapy  PLAN FOR NEXT SESSION: Assess response to quadruped exercises. Work on knee ROM, sit to stand transfers; youth worker. Gait training. Stair  negotiation - reciprocal step down  Lamarr Price, PTA 12/03/23 10:16 AM

## 2023-12-07 ENCOUNTER — Ambulatory Visit: Payer: Federal, State, Local not specified - PPO | Admitting: Physical Therapy

## 2023-12-07 DIAGNOSIS — R2689 Other abnormalities of gait and mobility: Secondary | ICD-10-CM

## 2023-12-07 DIAGNOSIS — M25561 Pain in right knee: Secondary | ICD-10-CM | POA: Diagnosis not present

## 2023-12-07 DIAGNOSIS — R2681 Unsteadiness on feet: Secondary | ICD-10-CM

## 2023-12-07 DIAGNOSIS — M6281 Muscle weakness (generalized): Secondary | ICD-10-CM

## 2023-12-07 NOTE — Therapy (Signed)
OUTPATIENT PHYSICAL THERAPY LOWER EXTREMITY TREATMENT   Patient Name: Karen Oconnor MRN: [id number] DOB:1976-05-28, 48 y.o., female Today's Date: 12/07/2023  END OF SESSION:  PT End of Session - 12/07/23 0931     Visit Number 10    Number of Visits 17    Date for PT Re-Evaluation 01/01/24    Authorization Type BCBS    PT Start Time 0931    PT Stop Time 1010    PT Time Calculation (min) 39 min    Activity Tolerance Patient tolerated treatment well    Behavior During Therapy [facility] for tasks assessed/performed             Past Medical History:  Diagnosis Date   Diabetes (HCC)    MS (multiple sclerosis) (HCC)    Past Surgical History:  Procedure Laterality Date   ABDOMINAL HYSTERECTOMY  2016   Patient Active Problem List   Diagnosis Date Noted   Lateral meniscal tear 09/10/2023   Pain of knee joint with osteochondral injury 09/10/2023   Transverse myelitis (HCC) 02/25/2022   Multiple sclerosis (HCC) 02/25/2022   Right foot drop 02/25/2022   High risk medication use 02/25/2022   Allodynia 02/25/2022    PCP: Harvie Heck, MD  REFERRING PROVIDER: Eldred Manges, MD   REFERRING DIAG: [phone] (ICD-10-CM) - Other tear of lateral meniscus of right knee, unspecified whether old or current tear, sequela   THERAPY DIAG:  Acute pain of right knee  Muscle weakness (generalized)  Other abnormalities of gait and mobility  Unsteadiness on feet  Rationale for Evaluation and Treatment: Rehabilitation  ONSET DATE: 09/13/23  SUBJECTIVE:   SUBJECTIVE STATEMENT: Patient states she fell on Saturday. Pain is currently along the side of her R knee. Pt states she has not been able to find her foot brace -- has been traveling.   EVAL: Patient underwent knee surgery in November and has been doing exercises at home, but needs more help in how to progress her ROM and strength. The pain is better since the surgery. Still has pain in the knee if she twists and when she goes down the  stairs. Patient was diagnosed with MS in 2013 and feels her balance, coordination, and strength is effected on the Rt side. She does have spasticity in the calf. She also has neuropathy in both feet.   PERTINENT HISTORY: MS PAIN:  Are you having pain? Yes: NPRS scale: none currently; at worst 2/10 Pain location: Rt anterior knee Pain description: tender Aggravating factors: stair descent, twisting, excessive flexion  Relieving factors: straightening the knee   PRECAUTIONS: Fall  WEIGHT BEARING RESTRICTIONS: No  FALLS:  Has patient fallen in last 6 months? Yes. Number of falls 2; tripped over grate and fell, LOB and fell  LIVING ENVIRONMENT: Lives with: lives with their daughter Lives in: House/apartment Stairs: Yes: Internal: 2 x 7 steps; on left going up and External: 7 steps; can reach both Has following equipment at home: Single point cane, Quad cane small base, and Walker - 2 wheeled  OCCUPATION: work from home   PATIENT GOALS: "get the full ROM, guidance in exercise."   OBJECTIVE:  Note: Objective measures were completed at Evaluation unless otherwise noted.  DIAGNOSTIC FINDINGS:  Rt knee MRI: IMPRESSION: 1. Small radial tear of the lateral meniscus midbody. 2. 1.7 cm unstable osteochondral lesion of the peripheral lateral femoral condyle. 3. 4.1 cm synovial mass in the lateral joint space adjacent to the patella, possibly focal nodular synovitis. 4. 2.3 cm multiloculated  cyst along the medial joint line could represent a parameniscal cyst from occult meniscal tear or ganglion cyst. 5. Mild tricompartmental osteoarthritis.  COGNITION: Overall cognitive status: Within functional limits for tasks assessed     SENSATION: Not tested; patient endorses bilateral neuropathy   EDEMA:  Unable to inspect knee due to clothing  POSTURE: weight shifts Lt, maintains RLE abducted with foot ER.   PALPATION: TTP lateral aspect of Rt knee   LOWER EXTREMITY ROM: Active  ROM Right eval Left eval Right 11/12/23 11/23/23 Right   Hip flexion      Hip extension      Hip abduction      Hip adduction      Hip internal rotation      Hip external rotation      Knee flexion 85 in sitting  92 in sitting 96 in sitting   Knee extension Lacking 4     Ankle dorsiflexion      Ankle plantarflexion      Ankle inversion      Ankle eversion       (Blank rows = not tested)  LOWER EXTREMITY MMT: MMT Right eval Left eval  Hip flexion 3- 5  Hip extension    Hip abduction    Hip adduction    Hip internal rotation    Hip external rotation    Knee flexion 4- 5  Knee extension 4- 5  Ankle dorsiflexion 3- 5  Ankle plantarflexion 3+ in sitting 5  Ankle inversion 3- 5  Ankle eversion 3- 5   (Blank rows = not tested)  FUNCTIONAL TESTS:  Sit to stand: maintains Rt knee in extension; significant use of BUE  Stair negotiation: reciprocal stair negotiation with ascent; step to descent   GAIT: Distance walked: 20 ft  Assistive device utilized: None Level of assistance: Complete Independence Comments: circumduction RLE, decreased knee flexion RLE, Rt foot ER, lateral trunk lean   [facility] Adult PT Treatment:                                                DATE: 12/07/2023 Therapeutic Exercise: Recumbent bike L1 x 5 min fwd & bwd Supine hip flexor/quad stretch strap 2x 30" Supine ITB stretch with strap 2x30" Sitting heel raise 2x10 Manual Therapy: STM & TPR hamstring Neuromuscular re-ed: SLR 2x10 with strap assist Clamshell 2x10 Donkey kick 2x10 Hip circles x20" CW & CCW   [facility] Adult PT Treatment:                                                DATE: 12/03/2023 Therapeutic Exercise: Recumbent bike L1 x 5 min (R) high knee step over yoga block --> front & lateral Heel raises 2x10 Quadruped: (R) hip extension (bent knee) x10 (R) hydrant x10 Small range rock back 10x5" Prone hip extension (straight leg) x10  LAQ (R) 2#AW 2x10 Eccentric leg press 40# 2x12 Self  Care: Using pedaler at home  Patella self-mobs    [facility] Adult PT Treatment:  DATE: 12/01/2023 Therapeutic Exercise: Recumbent bike partial revolutions x 3 min LAQ (R) 1.5#AW 2x15 Staggered stance STS on airex  Stairs: 2" --> 4" step down leading with L leg Reciprocal stepping down 4" --> 6" steps Lateral step up 2" --> 4" step Eccentric leg press 40# 2x10 Standing (R) hip abd, ext, HS curls x 15 each                                                                                                                               PATIENT EDUCATION:  Education details: HEP review; see treatment  Person educated: Patient Education method: Explanation and Handouts Education comprehension: verbalized understanding  HOME EXERCISE PROGRAM: Access Code: [email or web address] URL: [email or web address] Date: 12/03/2023 Prepared by: Carlynn Herald  Exercises - Supine Quad Set  - 1 x daily - 7 x weekly - 2 sets - 10 reps - 5 sec hold - Seated Knee Flexion AAROM  - 1 x daily - 7 x weekly - 1 sets - 10 reps - 5 sec  hold - Seated Active Assistive Knee Flexion and Extension  - 1 x daily - 7 x weekly - 1 sets - 10 reps - 5 sec  hold - Standing Terminal Knee Extension with Resistance  - 1 x daily - 7 x weekly - 2 sets - 10 reps - Sit to Stand with Armchair  - 1 x daily - 7 x weekly - 2 sets - 10 reps - Long Sitting 4 Way Patellar Glide  - 1 x daily - 7 x weekly - 3 minutes  hold - Standing Knee Flexion  - 1 x daily - 7 x weekly - 3 sets - 10 reps - Supine Knee Extension Strengthening  - 1 x daily - 7 x weekly - 3 sets - 10 reps - 5 sec hold - Standing Hip Abduction with Counter Support  - 1 x daily - 7 x weekly - 3 sets - 10 reps - Quadruped Fire Hydrant  - 1 x daily - 7 x weekly - 3 sets - 10 reps - Quadruped Alternating Leg Extensions  - 1 x daily - 7 x weekly - 3 sets - 10 reps - Quadruped Rock Back (BKA)  - 1 x daily - 7 x weekly - 3 sets - 10  reps  ASSESSMENT:  CLINICAL IMPRESSION: Pt with recent fall -- deferred standing and quadruped exercises today due to her increased lateral knee pain. Worked primarily with gross R LE strengthening without irritating her knee. Continued to work on improving knee ROM. Some hamstring tightness affecting comfort with SLR.   From eval: Patient is a 48 y.o. female who was seen today for physical therapy evaluation and treatment for s/p Rt knee arthroscopy, debridement of osteochondral lesion and partial lateral meniscectomy on 09/13/23. She also has a history of MS reporting balance, strength and coordination deficits on the RLE. Upon assessment she is noted to have limited Rt knee ROM,  RLE weakness, difficulty with transfers and stair negotiation, and gait abnormalities. She will benefit from skilled PT to address the above stated deficits in order to optimize her function and assist with pain reduction.   OBJECTIVE IMPAIRMENTS: Abnormal gait, decreased activity tolerance, decreased balance, decreased coordination, difficulty walking, decreased ROM, decreased strength, hypomobility, impaired sensation, improper body mechanics, postural dysfunction, and pain.   GOALS: Goals reviewed with patient? Yes  SHORT TERM GOALS: Target date: 12/01/2023  Patient will be independent and compliant with initial HEP.  Baseline: issued at eval  Goal status: MET  2.  Patient will perform sit to stand without UE support to improve ease of transfers.  Baseline: see above  11/23/23: requires use of UE support due to pain/weakness  Goal status: ongoing   3.  Patient will demonstrate at least 100 degrees of Rt knee flexion AROM to improve gait mechanics.  Baseline: see above  Goal status: progressing    LONG TERM GOALS: Target date: 01/01/24  Patient will be able to descend stairs with reciprocal pattern with handrail support.  Baseline: step to  Goal status: INITIAL  2.  Patient will demonstrate full Rt knee  extension to improve gait mechanics.  Baseline: see above  Goal status: INITIAL  3.  Patient will return to dance activity without limitation as it relates to her knee.  Baseline: currently using brace and avoiding twisting motions.  Goal status: INITIAL  4.  Patient will be independent with advanced home program to progress/maintain current level of function.  Baseline: initial HEP issued  Goal status: INITIAL  PLAN:  PT FREQUENCY: 1-2x/week  PT DURATION: 8 weeks  PLANNED INTERVENTIONS: 97164- PT Re-evaluation, 97110-Therapeutic exercises, 97530- Therapeutic activity, 97112- Neuromuscular re-education, 97535- Self Care, 16109- Manual therapy, [id number]- Gait training, 97014- Electrical stimulation (unattended), [id number]- Electrical stimulation (manual), 97016- Vasopneumatic device, Dry Needling, Joint mobilization, and Cryotherapy  PLAN FOR NEXT SESSION: Assess response to quadruped exercises. Work on knee ROM, sit to stand transfers; Youth worker. Gait training. Stair negotiation - reciprocal step down  [facility] April Dell Ponto, PT 12/07/23 9:32 AM

## 2023-12-09 ENCOUNTER — Ambulatory Visit: Payer: Federal, State, Local not specified - PPO | Admitting: Physical Therapy

## 2023-12-09 DIAGNOSIS — R2681 Unsteadiness on feet: Secondary | ICD-10-CM

## 2023-12-09 DIAGNOSIS — M25561 Pain in right knee: Secondary | ICD-10-CM | POA: Diagnosis not present

## 2023-12-09 DIAGNOSIS — M6281 Muscle weakness (generalized): Secondary | ICD-10-CM

## 2023-12-09 DIAGNOSIS — R2689 Other abnormalities of gait and mobility: Secondary | ICD-10-CM

## 2023-12-09 NOTE — Therapy (Signed)
OUTPATIENT PHYSICAL THERAPY LOWER EXTREMITY TREATMENT   Patient Name: Karen Oconnor MRN: [id number] DOB:[birth year], 48 y.o., female Today's Date: 12/09/2023  END OF SESSION:  PT End of Session - 12/09/23 1104     Visit Number 11    Number of Visits 17    Date for PT Re-Evaluation 01/01/24    Authorization Type BCBS    PT Start Time 1104    PT Stop Time 1145    PT Time Calculation (min) 41 min    Activity Tolerance Patient tolerated treatment well    Behavior During Therapy WFL for tasks assessed/performed              Past Medical History:  Diagnosis Date   Diabetes (HCC)    MS (multiple sclerosis) (HCC)    Past Surgical History:  Procedure Laterality Date   ABDOMINAL HYSTERECTOMY  2016   Patient Active Problem List   Diagnosis Date Noted   Lateral meniscal tear 09/10/2023   Pain of knee joint with osteochondral injury 09/10/2023   Transverse myelitis (HCC) 02/25/2022   Multiple sclerosis (HCC) 02/25/2022   Right foot drop 02/25/2022   High risk medication use 02/25/2022   Allodynia 02/25/2022    PCP: Harvie Heck, MD  REFERRING PROVIDER: Eldred Manges, MD   REFERRING DIAG: [phone] (ICD-10-CM) - Other tear of lateral meniscus of right knee, unspecified whether old or current tear, sequela   THERAPY DIAG:  Acute pain of right knee  Muscle weakness (generalized)  Other abnormalities of gait and mobility  Unsteadiness on feet  Rationale for Evaluation and Treatment: Rehabilitation  ONSET DATE: 09/13/23  SUBJECTIVE:   SUBJECTIVE STATEMENT: Pt states the side of her R knee is still hurting. Reports she didn't go to dance class.   EVAL: Patient underwent knee surgery in November and has been doing exercises at home, but needs more help in how to progress her ROM and strength. The pain is better since the surgery. Still has pain in the knee if she twists and when she goes down the stairs. Patient was diagnosed with MS in 2013 and feels her balance,  coordination, and strength is effected on the Rt side. She does have spasticity in the calf. She also has neuropathy in both feet.   PERTINENT HISTORY: MS PAIN:  Are you having pain? Yes: NPRS scale: 3 currently/10 Pain location: Rt anterior knee Pain description: tender Aggravating factors: stair descent, twisting, excessive flexion  Relieving factors: straightening the knee   PRECAUTIONS: Fall  WEIGHT BEARING RESTRICTIONS: No  FALLS:  Has patient fallen in last 6 months? Yes. Number of falls 2; tripped over grate and fell, LOB and fell  LIVING ENVIRONMENT: Lives with: lives with their daughter Lives in: House/apartment Stairs: Yes: Internal: 2 x 7 steps; on left going up and External: 7 steps; can reach both Has following equipment at home: Single point cane, Quad cane small base, and Walker - 2 wheeled  OCCUPATION: work from home   PATIENT GOALS: "get the full ROM, guidance in exercise."   OBJECTIVE:  Note: Objective measures were completed at Evaluation unless otherwise noted.  DIAGNOSTIC FINDINGS:  Rt knee MRI: IMPRESSION: 1. Small radial tear of the lateral meniscus midbody. 2. 1.7 cm unstable osteochondral lesion of the peripheral lateral femoral condyle. 3. 4.1 cm synovial mass in the lateral joint space adjacent to the patella, possibly focal nodular synovitis. 4. 2.3 cm multiloculated cyst along the medial joint line could represent a parameniscal cyst from occult meniscal tear or  ganglion cyst. 5. Mild tricompartmental osteoarthritis.  COGNITION: Overall cognitive status: Within functional limits for tasks assessed     SENSATION: Not tested; patient endorses bilateral neuropathy   EDEMA:  Unable to inspect knee due to clothing  POSTURE: weight shifts Lt, maintains RLE abducted with foot ER.   PALPATION: TTP lateral aspect of Rt knee   LOWER EXTREMITY ROM: Active ROM Right eval Left eval Right 11/12/23 11/23/23 Right   Hip flexion      Hip  extension      Hip abduction      Hip adduction      Hip internal rotation      Hip external rotation      Knee flexion 85 in sitting  92 in sitting 96 in sitting   Knee extension Lacking 4     Ankle dorsiflexion      Ankle plantarflexion      Ankle inversion      Ankle eversion       (Blank rows = not tested)  LOWER EXTREMITY MMT: MMT Right eval Left eval  Hip flexion 3- 5  Hip extension    Hip abduction    Hip adduction    Hip internal rotation    Hip external rotation    Knee flexion 4- 5  Knee extension 4- 5  Ankle dorsiflexion 3- 5  Ankle plantarflexion 3+ in sitting 5  Ankle inversion 3- 5  Ankle eversion 3- 5   (Blank rows = not tested)  FUNCTIONAL TESTS:  Sit to stand: maintains Rt knee in extension; significant use of BUE  Stair negotiation: reciprocal stair negotiation with ascent; step to descent   GAIT: Distance walked: 20 ft  Assistive device utilized: None Level of assistance: Complete Independence Comments: circumduction RLE, decreased knee flexion RLE, Rt foot ER, lateral trunk lean  [facility] Adult PT Treatment:                                                DATE: 12/09/23 Therapeutic Exercise: Nustep L4 x 5 min UEs/LEs Manual Therapy: IASTM ITB, quads, hamstrings, gastroc/soleus Neuromuscular re-ed: Supine quad set 2x10 Supine hip abd/add 2x10 Bridge 2x10 Therapeutic Activity: Glute set one foot on 4" step 2x10 R calf/hip flexor stretch on step 2x30" Weight shift L<>R with narrower BOS to improve R LE stance Gait: Amb x 3 laps around gym SPC, cues for narrower base, decrease R knee valgus and R foot ER   [facility] Adult PT Treatment:                                                DATE: 12/07/2023 Therapeutic Exercise: Recumbent bike L1 x 5 min fwd & bwd Supine hip flexor/quad stretch strap 2x 30" Supine ITB stretch with strap 2x30" Sitting heel raise 2x10 Manual Therapy: STM & TPR hamstring Neuromuscular re-ed: SLR 2x10 with strap  assist Clamshell 2x10 Donkey kick 2x10 Hip circles x20" CW & CCW   [facility] Adult PT Treatment:  DATE: 12/03/2023 Therapeutic Exercise: Recumbent bike L1 x 5 min (R) high knee step over yoga block --> front & lateral Heel raises 2x10 Quadruped: (R) hip extension (bent knee) x10 (R) hydrant x10 Small range rock back 10x5" Prone hip extension (straight leg) x10  LAQ (R) 2#AW 2x10 Eccentric leg press 40# 2x12 Self Care: Using pedaler at home  Patella self-mobs                                                                                                                              PATIENT EDUCATION:  Education details: HEP review; see treatment  Person educated: Patient Education method: Explanation and Handouts Education comprehension: verbalized understanding  HOME EXERCISE PROGRAM: Access Code: [email or web address] URL: [email or web address] Date: 12/03/2023 Prepared by: Carlynn Herald  Exercises - Supine Quad Set  - 1 x daily - 7 x weekly - 2 sets - 10 reps - 5 sec hold - Seated Knee Flexion AAROM  - 1 x daily - 7 x weekly - 1 sets - 10 reps - 5 sec  hold - Seated Active Assistive Knee Flexion and Extension  - 1 x daily - 7 x weekly - 1 sets - 10 reps - 5 sec  hold - Standing Terminal Knee Extension with Resistance  - 1 x daily - 7 x weekly - 2 sets - 10 reps - Sit to Stand with Armchair  - 1 x daily - 7 x weekly - 2 sets - 10 reps - Long Sitting 4 Way Patellar Glide  - 1 x daily - 7 x weekly - 3 minutes  hold - Standing Knee Flexion  - 1 x daily - 7 x weekly - 3 sets - 10 reps - Supine Knee Extension Strengthening  - 1 x daily - 7 x weekly - 3 sets - 10 reps - 5 sec hold - Standing Hip Abduction with Counter Support  - 1 x daily - 7 x weekly - 3 sets - 10 reps - Quadruped Fire Hydrant  - 1 x daily - 7 x weekly - 3 sets - 10 reps - Quadruped Alternating Leg Extensions  - 1 x daily - 7 x weekly - 3 sets - 10 reps - Quadruped  Rock Back (BKA)  - 1 x daily - 7 x weekly - 3 sets - 10 reps  ASSESSMENT:  CLINICAL IMPRESSION: Pt still getting pain and discomfort with quad setting without knee valgus -- feels pull in the back of her knee and calf. Working on improving terminal knee extension and weight bearing through R LE today. Discussed with pt working on her gait pattern to reduce knee valgus during stance.   From eval: Patient is a 48 y.o. female who was seen today for physical therapy evaluation and treatment for s/p Rt knee arthroscopy, debridement of osteochondral lesion and partial lateral meniscectomy on 09/13/23. She also has a history of MS reporting balance, strength and coordination deficits on the RLE. Upon  assessment she is noted to have limited Rt knee ROM, RLE weakness, difficulty with transfers and stair negotiation, and gait abnormalities. She will benefit from skilled PT to address the above stated deficits in order to optimize her function and assist with pain reduction.   OBJECTIVE IMPAIRMENTS: Abnormal gait, decreased activity tolerance, decreased balance, decreased coordination, difficulty walking, decreased ROM, decreased strength, hypomobility, impaired sensation, improper body mechanics, postural dysfunction, and pain.   GOALS: Goals reviewed with patient? Yes  SHORT TERM GOALS: Target date: 12/01/2023  Patient will be independent and compliant with initial HEP.  Baseline: issued at eval  Goal status: MET  2.  Patient will perform sit to stand without UE support to improve ease of transfers.  Baseline: see above  11/23/23: requires use of UE support due to pain/weakness  Goal status: ongoing   3.  Patient will demonstrate at least 100 degrees of Rt knee flexion AROM to improve gait mechanics.  Baseline: see above  Goal status: progressing    LONG TERM GOALS: Target date: 01/01/24  Patient will be able to descend stairs with reciprocal pattern with handrail support.  Baseline: step to   Goal status: INITIAL  2.  Patient will demonstrate full Rt knee extension to improve gait mechanics.  Baseline: see above  Goal status: INITIAL  3.  Patient will return to dance activity without limitation as it relates to her knee.  Baseline: currently using brace and avoiding twisting motions.  Goal status: INITIAL  4.  Patient will be independent with advanced home program to progress/maintain current level of function.  Baseline: initial HEP issued  Goal status: INITIAL  PLAN:  PT FREQUENCY: 1-2x/week  PT DURATION: 8 weeks  PLANNED INTERVENTIONS: 97164- PT Re-evaluation, 97110-Therapeutic exercises, 97530- Therapeutic activity, 97112- Neuromuscular re-education, 97535- Self Care, 16109- Manual therapy, [id number]- Gait training, 97014- Electrical stimulation (unattended), [id number]- Electrical stimulation (manual), 97016- Vasopneumatic device, Dry Needling, Joint mobilization, and Cryotherapy  PLAN FOR NEXT SESSION: Assess response to quadruped exercises. Work on knee ROM, sit to stand transfers; Youth worker. Gait training. Stair negotiation - reciprocal step down  [facility] April Ma L Dashon Mcintire, PT 12/09/23 11:04 AM

## 2023-12-11 ENCOUNTER — Encounter: Payer: Self-pay | Admitting: Orthopaedic Surgery

## 2023-12-13 ENCOUNTER — Ambulatory Visit: Payer: Federal, State, Local not specified - PPO

## 2023-12-13 DIAGNOSIS — R2681 Unsteadiness on feet: Secondary | ICD-10-CM

## 2023-12-13 DIAGNOSIS — M25561 Pain in right knee: Secondary | ICD-10-CM

## 2023-12-13 DIAGNOSIS — R2689 Other abnormalities of gait and mobility: Secondary | ICD-10-CM

## 2023-12-13 DIAGNOSIS — M6281 Muscle weakness (generalized): Secondary | ICD-10-CM

## 2023-12-13 NOTE — Therapy (Signed)
OUTPATIENT PHYSICAL THERAPY LOWER EXTREMITY TREATMENT   Patient Name: Karen Oconnor MRN: [id number] DOB:[birth year], 48 y.o., female Today's Date: 12/13/2023  END OF SESSION:  PT End of Session - 12/13/23 0934     Visit Number 12    Number of Visits 17    Date for PT Re-Evaluation 01/01/24    Authorization Type BC[employer]    PT Start Time 0934    PT Stop Time 1015    PT Time Calculation (min) 41 min    Activity Tolerance Patient tolerated treatment well    Behavior During Therapy [facility] for tasks assessed/performed             Past Medical History:  Diagnosis Date   Diabetes (HCC)    MS (multiple sclerosis) (HCC)    Past Surgical History:  Procedure Laterality Date   ABDOMINAL HYSTERECTOMY  2016   Patient Active Problem List   Diagnosis Date Noted   Lateral meniscal tear 09/10/2023   Pain of knee joint with osteochondral injury 09/10/2023   Transverse myelitis (HCC) 02/25/2022   Multiple sclerosis (HCC) 02/25/2022   Right foot drop 02/25/2022   High risk medication use 02/25/2022   Allodynia 02/25/2022    PCP: Harvie Heck, MD  REFERRING PROVIDER: Eldred Manges, MD   REFERRING DIAG: [phone] (ICD-10-CM) - Other tear of lateral meniscus of right knee, unspecified whether old or current tear, sequela   THERAPY DIAG:  Acute pain of right knee  Muscle weakness (generalized)  Other abnormalities of gait and mobility  Unsteadiness on feet  Rationale for Evaluation and Treatment: Rehabilitation  ONSET DATE: 09/13/23  SUBJECTIVE:   SUBJECTIVE STATEMENT: Patient reports her knee is still hurting her from the fall a week ago; states she has "lots of pain" when walking and going up the stairs.  EVAL: Patient underwent knee surgery in November and has been doing exercises at home, but needs more help in how to progress her ROM and strength. The pain is better since the surgery. Still has pain in the knee if she twists and when she goes down the stairs. Patient was  diagnosed with MS in 2013 and feels her balance, coordination, and strength is effected on the Rt side. She does have spasticity in the calf. She also has neuropathy in both feet.   PERTINENT HISTORY: MS PAIN:  Are you having pain? Yes: NPRS scale: 3 currently/10 Pain location: Rt anterior knee Pain description: tender Aggravating factors: stair descent, twisting, excessive flexion  Relieving factors: straightening the knee   PRECAUTIONS: Fall  WEIGHT BEARING RESTRICTIONS: No  FALLS:  Has patient fallen in last 6 months? Yes. Number of falls 2; tripped over grate and fell, LOB and fell  LIVING ENVIRONMENT: Lives with: lives with their daughter Lives in: House/apartment Stairs: Yes: Internal: 2 x 7 steps; on left going up and External: 7 steps; can reach both Has following equipment at home: Single point cane, Quad cane small base, and Walker - 2 wheeled  OCCUPATION: work from home   PATIENT GOALS: "get the full ROM, guidance in exercise."   OBJECTIVE:  Note: Objective measures were completed at Evaluation unless otherwise noted.  DIAGNOSTIC FINDINGS:  Rt knee MRI: IMPRESSION: 1. Small radial tear of the lateral meniscus midbody. 2. 1.7 cm unstable osteochondral lesion of the peripheral lateral femoral condyle. 3. 4.1 cm synovial mass in the lateral joint space adjacent to the patella, possibly focal nodular synovitis. 4. 2.3 cm multiloculated cyst along the medial joint line could represent a  parameniscal cyst from occult meniscal tear or ganglion cyst. 5. Mild tricompartmental osteoarthritis.  COGNITION: Overall cognitive status: Within functional limits for tasks assessed     SENSATION: Not tested; patient endorses bilateral neuropathy   EDEMA:  Unable to inspect knee due to clothing  POSTURE: weight shifts Lt, maintains RLE abducted with foot ER.   PALPATION: TTP lateral aspect of Rt knee   LOWER EXTREMITY ROM: Active ROM Right eval Left eval  Right 11/12/23 11/23/23 Right   Hip flexion      Hip extension      Hip abduction      Hip adduction      Hip internal rotation      Hip external rotation      Knee flexion 85 in sitting  92 in sitting 96 in sitting   Knee extension Lacking 4     Ankle dorsiflexion      Ankle plantarflexion      Ankle inversion      Ankle eversion       (Blank rows = not tested)  LOWER EXTREMITY MMT: MMT Right eval Left eval  Hip flexion 3- 5  Hip extension    Hip abduction    Hip adduction    Hip internal rotation    Hip external rotation    Knee flexion 4- 5  Knee extension 4- 5  Ankle dorsiflexion 3- 5  Ankle plantarflexion 3+ in sitting 5  Ankle inversion 3- 5  Ankle eversion 3- 5   (Blank rows = not tested)  FUNCTIONAL TESTS:  Sit to stand: maintains Rt knee in extension; significant use of BUE  Stair negotiation: reciprocal stair negotiation with ascent; step to descent   GAIT: Distance walked: 20 ft  Assistive device utilized: None Level of assistance: Complete Independence Comments: circumduction RLE, decreased knee flexion RLE, Rt foot ER, lateral trunk lean  [facility] Adult PT Treatment:                                                DATE: 12/13/2023 Therapeutic Exercise: Passive knee flexion Quad set Hip flexion stretch in standing Lateral weight shifting on R LE Manual Therapy: STM peroneals, distal quad & ITB Cupping: passive & active for myofascial decompression --> gliding along peroneals & lateral quad IASTM distal adductors, medial quad    [facility] Adult PT Treatment:                                                DATE: 12/09/23 Therapeutic Exercise: Nustep L4 x 5 min UEs/LEs Manual Therapy: IASTM ITB, quads, hamstrings, gastroc/soleus Neuromuscular re-ed: Supine quad set 2x10 Supine hip abd/add 2x10 Bridge 2x10 Therapeutic Activity: Glute set one foot on 4" step 2x10 R calf/hip flexor stretch on step 2x30" Weight shift L<>R with narrower BOS to improve R LE  stance Gait: Amb x 3 laps around gym SPC, cues for narrower base, decrease R knee valgus and R foot ER   [facility] Adult PT Treatment:  DATE: 12/07/2023 Therapeutic Exercise: Recumbent bike L1 x 5 min fwd & bwd Supine hip flexor/quad stretch strap 2x 30" Supine ITB stretch with strap 2x30" Sitting heel raise 2x10 Manual Therapy: STM & TPR hamstring Neuromuscular re-ed: SLR 2x10 with strap assist Clamshell 2x10 Donkey kick 2x10 Hip circles x20" CW & CCW   [facility] Adult PT Treatment:                                                DATE: 12/03/2023 Therapeutic Exercise: Recumbent bike L1 x 5 min (R) high knee step over yoga block --> front & lateral Heel raises 2x10 Quadruped: (R) hip extension (bent knee) x10 (R) hydrant x10 Small range rock back 10x5" Prone hip extension (straight leg) x10  LAQ (R) 2#AW 2x10 Eccentric leg press 40# 2x12 Self Care: Using pedaler at home  Patella self-mobs                                                                                                                              PATIENT EDUCATION:  Education details: HEP review; see treatment  Person educated: Patient Education method: Explanation and Handouts Education comprehension: verbalized understanding  HOME EXERCISE PROGRAM: Access Code: [email or web address] URL: [email or web address] Date: 12/03/2023 Prepared by: Carlynn Herald  Exercises - Supine Quad Set  - 1 x daily - 7 x weekly - 2 sets - 10 reps - 5 sec hold - Seated Knee Flexion AAROM  - 1 x daily - 7 x weekly - 1 sets - 10 reps - 5 sec  hold - Seated Active Assistive Knee Flexion and Extension  - 1 x daily - 7 x weekly - 1 sets - 10 reps - 5 sec  hold - Standing Terminal Knee Extension with Resistance  - 1 x daily - 7 x weekly - 2 sets - 10 reps - Sit to Stand with Armchair  - 1 x daily - 7 x weekly - 2 sets - 10 reps - Long Sitting 4 Way Patellar Glide  - 1 x daily - 7 x weekly -  3 minutes  hold - Standing Knee Flexion  - 1 x daily - 7 x weekly - 3 sets - 10 reps - Supine Knee Extension Strengthening  - 1 x daily - 7 x weekly - 3 sets - 10 reps - 5 sec hold - Standing Hip Abduction with Counter Support  - 1 x daily - 7 x weekly - 3 sets - 10 reps - Quadruped Fire Hydrant  - 1 x daily - 7 x weekly - 3 sets - 10 reps - Quadruped Alternating Leg Extensions  - 1 x daily - 7 x weekly - 3 sets - 10 reps - Quadruped Rock Back (BKA)  - 1 x daily - 7 x weekly - 3 sets - 10 reps  ASSESSMENT:  CLINICAL IMPRESSION: Myofascial decompression performed with cupping, IASTM, and STM along distal quads, proximal peroneals. Noted tension along distal adductors. Decreased pain reported after manual treatment with weight bearing in standing. Increased tension along lateral knee when standing fully upright.  From eval: Patient is a 48 y.o. female who was seen today for physical therapy evaluation and treatment for s/p Rt knee arthroscopy, debridement of osteochondral lesion and partial lateral meniscectomy on 09/13/23. She also has a history of MS reporting balance, strength and coordination deficits on the RLE. Upon assessment she is noted to have limited Rt knee ROM, RLE weakness, difficulty with transfers and stair negotiation, and gait abnormalities. She will benefit from skilled PT to address the above stated deficits in order to optimize her function and assist with pain reduction.   OBJECTIVE IMPAIRMENTS: Abnormal gait, decreased activity tolerance, decreased balance, decreased coordination, difficulty walking, decreased ROM, decreased strength, hypomobility, impaired sensation, improper body mechanics, postural dysfunction, and pain.   GOALS: Goals reviewed with patient? Yes  SHORT TERM GOALS: Target date: 12/01/2023  Patient will be independent and compliant with initial HEP.  Baseline: issued at eval  Goal status: MET  2.  Patient will perform sit to stand without UE support to  improve ease of transfers.  Baseline: see above  11/23/23: requires use of UE support due to pain/weakness  Goal status: ongoing   3.  Patient will demonstrate at least 100 degrees of Rt knee flexion AROM to improve gait mechanics.  Baseline: see above  Goal status: progressing    LONG TERM GOALS: Target date: 01/01/24  Patient will be able to descend stairs with reciprocal pattern with handrail support.  Baseline: step to  Goal status: INITIAL  2.  Patient will demonstrate full Rt knee extension to improve gait mechanics.  Baseline: see above  Goal status: INITIAL  3.  Patient will return to dance activity without limitation as it relates to her knee.  Baseline: currently using brace and avoiding twisting motions.  Goal status: INITIAL  4.  Patient will be independent with advanced home program to progress/maintain current level of function.  Baseline: initial HEP issued  Goal status: INITIAL  PLAN:  PT FREQUENCY: 1-2x/week  PT DURATION: 8 weeks  PLANNED INTERVENTIONS: 97164- PT Re-evaluation, 97110-Therapeutic exercises, 97530- Therapeutic activity, 97112- Neuromuscular re-education, 97535- Self Care, 16109- Manual therapy, [id number]- Gait training, 97014- Electrical stimulation (unattended), [id number]- Electrical stimulation (manual), 97016- Vasopneumatic device, Dry Needling, Joint mobilization, and Cryotherapy  PLAN FOR NEXT SESSION: Follow-up on lateral knee pain from fall. Work on knee ROM, sit to stand transfers; Youth worker. Gait training. Stair negotiation - reciprocal step down  Sanjuana Mae, PTA 12/13/23 11:14 AM

## 2023-12-14 ENCOUNTER — Ambulatory Visit: Payer: Federal, State, Local not specified - PPO | Admitting: Orthopaedic Surgery

## 2023-12-14 ENCOUNTER — Encounter: Payer: Self-pay | Admitting: Orthopaedic Surgery

## 2023-12-14 ENCOUNTER — Other Ambulatory Visit (INDEPENDENT_AMBULATORY_CARE_PROVIDER_SITE_OTHER): Payer: Self-pay

## 2023-12-14 VITALS — BP 145/98 | HR 102 | Ht 66.0 in | Wt 200.0 lb

## 2023-12-14 DIAGNOSIS — M79661 Pain in right lower leg: Secondary | ICD-10-CM

## 2023-12-14 NOTE — Progress Notes (Unsigned)
   Office Visit Note   Patient: Karen Oconnor           Date of Birth: [birth year]           MRN: [id number] Visit Date: 12/14/2023              Requested by: Harvie Heck, MD [address] [place],  [place] [address] PCP: Harvie Heck, MD   Assessment & Plan: Visit Diagnoses:  1. Pain in right lower leg     Plan: ***  Follow-Up Instructions: No follow-ups on file.   Orders:  Orders Placed This Encounter  Procedures   XR Tibia/Fibula Right   No orders of the defined types were placed in this encounter.     Procedures: No procedures performed   Clinical Data: No additional findings.   Subjective: Chief Complaint  Patient presents with   Right Leg - Pain    Fall 12/04/2023    HPI  Review of Systems   Objective: Vital Signs: BP (!) 145/98   Pulse (!) 102   Ht 5\' 6"  (1.676 m)   Wt 200 lb (90.7 kg)   BMI 32.28 kg/m   Physical Exam  Ortho Exam  Specialty Comments:  No specialty comments available.  Imaging: No results found.   PMFS History: Patient Active Problem List   Diagnosis Date Noted   Lateral meniscal tear 09/10/2023   Pain of knee joint with osteochondral injury 09/10/2023   Transverse myelitis (HCC) 02/25/2022   Multiple sclerosis (HCC) 02/25/2022   Right foot drop 02/25/2022   High risk medication use 02/25/2022   Allodynia 02/25/2022   Past Medical History:  Diagnosis Date   Diabetes (HCC)    MS (multiple sclerosis) (HCC)     Family History  Problem Relation Age of Onset   Gout Mother    Diabetes Father     Past Surgical History:  Procedure Laterality Date   ABDOMINAL HYSTERECTOMY  2016   Social History   Occupational History   Not on file  Tobacco Use   Smoking status: Never   Smokeless tobacco: Never  Substance and Sexual Activity   Alcohol use: Yes    Comment: rare   Drug use: Never   Sexual activity: Not on file

## 2023-12-15 ENCOUNTER — Ambulatory Visit: Payer: Federal, State, Local not specified - PPO

## 2023-12-15 DIAGNOSIS — M6281 Muscle weakness (generalized): Secondary | ICD-10-CM

## 2023-12-15 DIAGNOSIS — R2689 Other abnormalities of gait and mobility: Secondary | ICD-10-CM

## 2023-12-15 DIAGNOSIS — M25561 Pain in right knee: Secondary | ICD-10-CM | POA: Diagnosis not present

## 2023-12-15 DIAGNOSIS — R2681 Unsteadiness on feet: Secondary | ICD-10-CM

## 2023-12-15 NOTE — Progress Notes (Signed)
   Post-Op Visit Note   Patient: Karen Oconnor           Date of Birth: Nov 03, 1975           MRN: [id number] Visit Date: 12/14/2023 PCP: Harvie Heck, MD   Assessment & Plan: 48 year old female seen in follow-up post knee arthroscopy 09/13/2023 debridement osteochondral lesion, localized lateral PVNS and lateral meniscal tear.  Patient was stepping and side fell on wet floor landed on her left side and had a right leg up into the side and apparently twisted her knee with increased pain lateral compartment difficulty bearing weight and has been using his cane.  She was doing well with stairs now she is not able to walk up and down stairs.  She has noted some swelling.  She is requesting x-rays.  Some tenderness over the anterior car compartment Gertie's tubercle trace knee effusion mild crepitus with knee flexion extension.  No significant lateral joint line tenderness.  No pain with hip range of motion.  Chief Complaint:  Chief Complaint  Patient presents with   Right Leg - Pain    Fall 12/04/2023   Visit Diagnoses:  1. Pain in right lower leg     Plan: She can continue some intermittent anti-inflammatories.  Gradually wean off the cane as her symptoms improved.  Recheck 5 weeks.  She works from home and and is continuing normal work activity.  Follow-Up Instructions: Return in about 5 weeks (around 01/18/2024).   Orders:  Orders Placed This Encounter  Procedures   XR Tibia/Fibula Right   No orders of the defined types were placed in this encounter.   Imaging: No results found.  PMFS History: Patient Active Problem List   Diagnosis Date Noted   Lateral meniscal tear 09/10/2023   Pain of knee joint with osteochondral injury 09/10/2023   Transverse myelitis (HCC) 02/25/2022   Multiple sclerosis (HCC) 02/25/2022   Right foot drop 02/25/2022   High risk medication use 02/25/2022   Allodynia 02/25/2022   Past Medical History:  Diagnosis Date   Diabetes (HCC)    MS  (multiple sclerosis) (HCC)     Family History  Problem Relation Age of Onset   Gout Mother    Diabetes Father     Past Surgical History:  Procedure Laterality Date   ABDOMINAL HYSTERECTOMY  2016   Social History   Occupational History   Not on file  Tobacco Use   Smoking status: Never   Smokeless tobacco: Never  Substance and Sexual Activity   Alcohol use: Yes    Comment: rare   Drug use: Never   Sexual activity: Not on file

## 2023-12-15 NOTE — Therapy (Signed)
OUTPATIENT PHYSICAL THERAPY LOWER EXTREMITY TREATMENT   Patient Name: Karen Oconnor MRN: [id number] DOB:08/07/1976, 48 y.o., female Today's Date: 12/15/2023  END OF SESSION:  PT End of Session - 12/15/23 1100     Visit Number 13    Date for PT Re-Evaluation 01/01/24    Authorization Type BCBS    PT Start Time 1100    PT Stop Time 1148    PT Time Calculation (min) 48 min    Activity Tolerance Patient tolerated treatment well    Behavior During Therapy WFL for tasks assessed/performed             Past Medical History:  Diagnosis Date   Diabetes (HCC)    MS (multiple sclerosis) (HCC)    Past Surgical History:  Procedure Laterality Date   ABDOMINAL HYSTERECTOMY  2016   Patient Active Problem List   Diagnosis Date Noted   Lateral meniscal tear 09/10/2023   Pain of knee joint with osteochondral injury 09/10/2023   Transverse myelitis (HCC) 02/25/2022   Multiple sclerosis (HCC) 02/25/2022   Right foot drop 02/25/2022   High risk medication use 02/25/2022   Allodynia 02/25/2022    PCP: Harvie Heck, MD  REFERRING PROVIDER: Eldred Manges, MD   REFERRING DIAG: [phone] (ICD-10-CM) - Other tear of lateral meniscus of right knee, unspecified whether old or current tear, sequela   THERAPY DIAG:  Acute pain of right knee  Muscle weakness (generalized)  Other abnormalities of gait and mobility  Unsteadiness on feet  Rationale for Evaluation and Treatment: Rehabilitation  ONSET DATE: 09/13/23  SUBJECTIVE:   SUBJECTIVE STATEMENT: Patient reports her knee is feeling better; states she saw MD who did imaging and no damage was found at knee from fall.   EVAL: Patient underwent knee surgery in November and has been doing exercises at home, but needs more help in how to progress her ROM and strength. The pain is better since the surgery. Still has pain in the knee if she twists and when she goes down the stairs. Patient was diagnosed with MS in 2013 and feels her  balance, coordination, and strength is effected on the Rt side. She does have spasticity in the calf. She also has neuropathy in both feet.   PERTINENT HISTORY: MS PAIN:  Are you having pain? Yes: NPRS scale: 3 currently/10 Pain location: Rt anterior knee Pain description: tender Aggravating factors: stair descent, twisting, excessive flexion  Relieving factors: straightening the knee   PRECAUTIONS: Fall  WEIGHT BEARING RESTRICTIONS: No  FALLS:  Has patient fallen in last 6 months? Yes. Number of falls 2; tripped over grate and fell, LOB and fell  LIVING ENVIRONMENT: Lives with: lives with their daughter Lives in: House/apartment Stairs: Yes: Internal: 2 x 7 steps; on left going up and External: 7 steps; can reach both Has following equipment at home: Single point cane, Quad cane small base, and Walker - 2 wheeled  OCCUPATION: work from home   PATIENT GOALS: "get the full ROM, guidance in exercise."   OBJECTIVE:  Note: Objective measures were completed at Evaluation unless otherwise noted.  DIAGNOSTIC FINDINGS:  Rt knee MRI: IMPRESSION: 1. Small radial tear of the lateral meniscus midbody. 2. 1.7 cm unstable osteochondral lesion of the peripheral lateral femoral condyle. 3. 4.1 cm synovial mass in the lateral joint space adjacent to the patella, possibly focal nodular synovitis. 4. 2.3 cm multiloculated cyst along the medial joint line could represent a parameniscal cyst from occult meniscal tear or ganglion cyst. 5.  Mild tricompartmental osteoarthritis.  COGNITION: Overall cognitive status: Within functional limits for tasks assessed     SENSATION: Not tested; patient endorses bilateral neuropathy   EDEMA:  Unable to inspect knee due to clothing  POSTURE: weight shifts Lt, maintains RLE abducted with foot ER.   PALPATION: TTP lateral aspect of Rt knee   LOWER EXTREMITY ROM: Active ROM Right eval Left eval Right 11/12/23 11/23/23 Right   Hip flexion       Hip extension      Hip abduction      Hip adduction      Hip internal rotation      Hip external rotation      Knee flexion 85 in sitting  92 in sitting 96 in sitting   Knee extension Lacking 4     Ankle dorsiflexion      Ankle plantarflexion      Ankle inversion      Ankle eversion       (Blank rows = not tested)  LOWER EXTREMITY MMT: MMT Right eval Left eval  Hip flexion 3- 5  Hip extension    Hip abduction    Hip adduction    Hip internal rotation    Hip external rotation    Knee flexion 4- 5  Knee extension 4- 5  Ankle dorsiflexion 3- 5  Ankle plantarflexion 3+ in sitting 5  Ankle inversion 3- 5  Ankle eversion 3- 5   (Blank rows = not tested)  FUNCTIONAL TESTS:  Sit to stand: maintains Rt knee in extension; significant use of BUE  Stair negotiation: reciprocal stair negotiation with ascent; step to descent   GAIT: Distance walked: 20 ft  Assistive device utilized: None Level of assistance: Complete Independence Comments: circumduction RLE, decreased knee flexion RLE, Rt foot ER, lateral trunk lean   [facility] Adult PT Treatment:                                                DATE: 12/15/2023 Therapeutic Exercise: Recumbent bike L1 x 5 min + subjective intake Seated AAROM straight leg raises w/strap --> added abd/add raises over towel Seated knee flexion stretch STS from airex x 6 Neuromuscular re-ed: Long sitting: Quad set 2x10 --> gait belt around shins for R LE alignment/support, towel behind R knee to decrease hyperextension Straight leg hip abd isometric with gait belt around shins 2x10 Supine AAROM hip abd/add with straight leg --> functional hip strengthening 2x10 Therapeutic Activity: Line dancing activities, challenging eccentric control: Resisted side stepping + RTB crossed at ankles Resisted diagonal stepping + YTB crossed at ankles    [facility] Adult PT Treatment:                                                DATE: 12/13/2023 Therapeutic  Exercise: Passive knee flexion Quad set Hip flexion stretch in standing Lateral weight shifting on R LE Manual Therapy: STM peroneals, distal quad & ITB Cupping: passive & active for myofascial decompression --> gliding along peroneals & lateral quad IASTM distal adductors, medial quad    [facility] Adult PT Treatment:  DATE: 12/09/23 Therapeutic Exercise: Nustep L4 x 5 min UEs/LEs Manual Therapy: IASTM ITB, quads, hamstrings, gastroc/soleus Neuromuscular re-ed: Supine quad set 2x10 Supine hip abd/add 2x10 Bridge 2x10 Therapeutic Activity: Glute set one foot on 4" step 2x10 R calf/hip flexor stretch on step 2x30" Weight shift L<>R with narrower BOS to improve R LE stance Gait: Amb x 3 laps around gym SPC, cues for narrower base, decrease R knee valgus and R foot ER                                                                                                                              PATIENT EDUCATION:  Education details: HEP review; see treatment  Person educated: Patient Education method: Explanation and Handouts Education comprehension: verbalized understanding  HOME EXERCISE PROGRAM: Access Code: [email or web address] URL: [email or web address] Date: 12/15/2023 Prepared by: Carlynn Herald  Exercises - Supine Quad Set  - 1 x daily - 7 x weekly - 2 sets - 10 reps - 5 sec hold - Seated Knee Flexion AAROM  - 1 x daily - 7 x weekly - 1 sets - 10 reps - 5 sec  hold - Seated Active Assistive Knee Flexion and Extension  - 1 x daily - 7 x weekly - 1 sets - 10 reps - 5 sec  hold - Standing Terminal Knee Extension with Resistance  - 1 x daily - 7 x weekly - 2 sets - 10 reps - Sit to Stand with Armchair  - 1 x daily - 7 x weekly - 2 sets - 10 reps - Long Sitting 4 Way Patellar Glide  - 1 x daily - 7 x weekly - 3 minutes  hold - Standing Knee Flexion  - 1 x daily - 7 x weekly - 3 sets - 10 reps - Supine Knee Extension Strengthening  -  1 x daily - 7 x weekly - 3 sets - 10 reps - 5 sec hold - Standing Hip Abduction with Counter Support  - 1 x daily - 7 x weekly - 3 sets - 10 reps - Quadruped Fire Hydrant  - 1 x daily - 7 x weekly - 3 sets - 10 reps - Quadruped Alternating Leg Extensions  - 1 x daily - 7 x weekly - 3 sets - 10 reps - Quadruped Rock Back (BKA)  - 1 x daily - 7 x weekly - 3 sets - 10 reps - Long Sitting Isometric Hip Abduction with Ball at [employer]  - 1 x daily - 7 x weekly - 3 sets - 10 reps  ASSESSMENT:  CLINICAL IMPRESSION: Eccentric control challenged with added resistance to progress stability with line dancing activities. Initial lateral knee discomfort with resisted side stepping to left, however decreased with repetition. Hip abd isometrics performed prior to assisted supine lateral leg slides to progress glute activation. Sit to stand continued from elevated surface, focusing on increased weight bearing on R LE.  From eval: Patient is a 48  y.o. female who was seen today for physical therapy evaluation and treatment for s/p Rt knee arthroscopy, debridement of osteochondral lesion and partial lateral meniscectomy on 09/13/23. She also has a history of MS reporting balance, strength and coordination deficits on the RLE. Upon assessment she is noted to have limited Rt knee ROM, RLE weakness, difficulty with transfers and stair negotiation, and gait abnormalities. She will benefit from skilled PT to address the above stated deficits in order to optimize her function and assist with pain reduction.   OBJECTIVE IMPAIRMENTS: Abnormal gait, decreased activity tolerance, decreased balance, decreased coordination, difficulty walking, decreased ROM, decreased strength, hypomobility, impaired sensation, improper body mechanics, postural dysfunction, and pain.   GOALS: Goals reviewed with patient? Yes  SHORT TERM GOALS: Target date: 12/01/2023  Patient will be independent and compliant with initial HEP.  Baseline: issued at  eval  Goal status: MET  2.  Patient will perform sit to stand without UE support to improve ease of transfers.  Baseline: see above  11/23/23: requires use of UE support due to pain/weakness  Goal status: ongoing   3.  Patient will demonstrate at least 100 degrees of Rt knee flexion AROM to improve gait mechanics.  Baseline: see above  Goal status: progressing    LONG TERM GOALS: Target date: 01/01/24  Patient will be able to descend stairs with reciprocal pattern with handrail support.  Baseline: step to  Goal status: INITIAL  2.  Patient will demonstrate full Rt knee extension to improve gait mechanics.  Baseline: see above  Goal status: INITIAL  3.  Patient will return to dance activity without limitation as it relates to her knee.  Baseline: currently using brace and avoiding twisting motions.  Goal status: INITIAL  4.  Patient will be independent with advanced home program to progress/maintain current level of function.  Baseline: initial HEP issued  Goal status: INITIAL  PLAN:  PT FREQUENCY: 1-2x/week  PT DURATION: 8 weeks  PLANNED INTERVENTIONS: 97164- PT Re-evaluation, 97110-Therapeutic exercises, 97530- Therapeutic activity, [id number]- Neuromuscular re-education, 97535- Self Care, [id number]- Manual therapy, [id number]- Gait training, 97014- Electrical stimulation (unattended), [id number]- Electrical stimulation (manual), 97016- Vasopneumatic device, Dry Needling, Joint mobilization, and Cryotherapy  PLAN FOR NEXT SESSION: Work on knee ROM, sit to stand transfers; Youth worker. Gait training. Stair negotiation - reciprocal step down  Sanjuana Mae, PTA 12/15/23 11:51 AM

## 2023-12-21 ENCOUNTER — Ambulatory Visit: Payer: Federal, State, Local not specified - PPO | Admitting: Physical Therapy

## 2023-12-21 DIAGNOSIS — M6281 Muscle weakness (generalized): Secondary | ICD-10-CM

## 2023-12-21 DIAGNOSIS — R2681 Unsteadiness on feet: Secondary | ICD-10-CM

## 2023-12-21 DIAGNOSIS — M25561 Pain in right knee: Secondary | ICD-10-CM | POA: Diagnosis not present

## 2023-12-21 DIAGNOSIS — R2689 Other abnormalities of gait and mobility: Secondary | ICD-10-CM

## 2023-12-21 NOTE — Therapy (Signed)
 OUTPATIENT PHYSICAL THERAPY LOWER EXTREMITY TREATMENT   Patient Name: Bahar Shelden MRN: [id number] DOB:30-Mar-1976, 48 y.o., female Today's Date: 12/21/2023  END OF SESSION:  PT End of Session - 12/21/23 1318     Visit Number 14    Date for PT Re-Evaluation 01/01/24    Authorization Type BCBS    PT Start Time 1319    PT Stop Time 1400    PT Time Calculation (min) 41 min    Activity Tolerance Patient tolerated treatment well    Behavior During Therapy WFL for tasks assessed/performed              Past Medical History:  Diagnosis Date   Diabetes (HCC)    MS (multiple sclerosis) (HCC)    Past Surgical History:  Procedure Laterality Date   ABDOMINAL HYSTERECTOMY  2016   Patient Active Problem List   Diagnosis Date Noted   Lateral meniscal tear 09/10/2023   Pain of knee joint with osteochondral injury 09/10/2023   Transverse myelitis (HCC) 02/25/2022   Multiple sclerosis (HCC) 02/25/2022   Right foot drop 02/25/2022   High risk medication use 02/25/2022   Allodynia 02/25/2022    PCP: Harvie Heck, MD  REFERRING PROVIDER: Eldred Manges, MD   REFERRING DIAG: [phone] (ICD-10-CM) - Other tear of lateral meniscus of right knee, unspecified whether old or current tear, sequela   THERAPY DIAG:  Acute pain of right knee  Muscle weakness (generalized)  Other abnormalities of gait and mobility  Unsteadiness on feet  Rationale for Evaluation and Treatment: Rehabilitation  ONSET DATE: 09/13/23  SUBJECTIVE:   SUBJECTIVE STATEMENT: Pt states she just came back from the emergency room. Work up was (-). Feels okay now -- no chest pain or SHOB. Pt states knee is doing better.   EVAL: Patient underwent knee surgery in November and has been doing exercises at home, but needs more help in how to progress her ROM and strength. The pain is better since the surgery. Still has pain in the knee if she twists and when she goes down the stairs. Patient was diagnosed with MS in  2013 and feels her balance, coordination, and strength is effected on the Rt side. She does have spasticity in the calf. She also has neuropathy in both feet.   PERTINENT HISTORY: MS PAIN:  Are you having pain? Yes: NPRS scale: 3 currently/10 Pain location: Rt anterior knee Pain description: tender Aggravating factors: stair descent, twisting, excessive flexion  Relieving factors: straightening the knee   PRECAUTIONS: Fall  WEIGHT BEARING RESTRICTIONS: No  FALLS:  Has patient fallen in last 6 months? Yes. Number of falls 2; tripped over grate and fell, LOB and fell  LIVING ENVIRONMENT: Lives with: lives with their daughter Lives in: House/apartment Stairs: Yes: Internal: 2 x 7 steps; on left going up and External: 7 steps; can reach both Has following equipment at home: Single point cane, Quad cane small base, and Walker - 2 wheeled  OCCUPATION: work from home   PATIENT GOALS: "get the full ROM, guidance in exercise."   OBJECTIVE:  Note: Objective measures were completed at Evaluation unless otherwise noted.  DIAGNOSTIC FINDINGS:  Rt knee MRI: IMPRESSION: 1. Small radial tear of the lateral meniscus midbody. 2. 1.7 cm unstable osteochondral lesion of the peripheral lateral femoral condyle. 3. 4.1 cm synovial mass in the lateral joint space adjacent to the patella, possibly focal nodular synovitis. 4. 2.3 cm multiloculated cyst along the medial joint line could represent a parameniscal cyst from  occult meniscal tear or ganglion cyst. 5. Mild tricompartmental osteoarthritis.  COGNITION: Overall cognitive status: Within functional limits for tasks assessed     SENSATION: Not tested; patient endorses bilateral neuropathy   EDEMA:  Unable to inspect knee due to clothing  POSTURE: weight shifts Lt, maintains RLE abducted with foot ER.   PALPATION: TTP lateral aspect of Rt knee   LOWER EXTREMITY ROM: Active ROM Right eval Left eval Right 11/12/23 11/23/23 Right    Hip flexion      Hip extension      Hip abduction      Hip adduction      Hip internal rotation      Hip external rotation      Knee flexion 85 in sitting  92 in sitting 96 in sitting   Knee extension Lacking 4     Ankle dorsiflexion      Ankle plantarflexion      Ankle inversion      Ankle eversion       (Blank rows = not tested)  LOWER EXTREMITY MMT: MMT Right eval Left eval  Hip flexion 3- 5  Hip extension    Hip abduction    Hip adduction    Hip internal rotation    Hip external rotation    Knee flexion 4- 5  Knee extension 4- 5  Ankle dorsiflexion 3- 5  Ankle plantarflexion 3+ in sitting 5  Ankle inversion 3- 5  Ankle eversion 3- 5   (Blank rows = not tested)  FUNCTIONAL TESTS:  Sit to stand: maintains Rt knee in extension; significant use of BUE  Stair negotiation: reciprocal stair negotiation with ascent; step to descent   GAIT: Distance walked: 20 ft  Assistive device utilized: None Level of assistance: Complete Independence Comments: circumduction RLE, decreased knee flexion RLE, Rt foot ER, lateral trunk lean   [facility] Adult PT Treatment:                                                DATE: 12/21/23 Therapeutic Exercise: Nustep L6 x 5 min UEs/LEs Neuromuscular re-ed: Leg press 65# double legs 2x10 focusing on decreasing R knee valgus Leg press 40# single leg 2x10 slow and controlled for eccentric motion Therapeutic Activity: Resisted walking forward, L, R x10 at 10#   [facility] Adult PT Treatment:                                                DATE: 12/15/2023 Therapeutic Exercise: Recumbent bike L1 x 5 min + subjective intake Seated AAROM straight leg raises w/strap --> added abd/add raises over towel Seated knee flexion stretch STS from airex x 6 Neuromuscular re-ed: Long sitting: Quad set 2x10 --> gait belt around shins for R LE alignment/support, towel behind R knee to decrease hyperextension Straight leg hip abd isometric with gait belt around  shins 2x10 Supine AAROM hip abd/add with straight leg --> functional hip strengthening 2x10 Therapeutic Activity: Line dancing activities, challenging eccentric control: Resisted side stepping + RTB crossed at ankles Resisted diagonal stepping + YTB crossed at ankles    [facility] Adult PT Treatment:  DATE: 12/13/2023 Therapeutic Exercise: Passive knee flexion Quad set Hip flexion stretch in standing Lateral weight shifting on R LE Manual Therapy: STM peroneals, distal quad & ITB Cupping: passive & active for myofascial decompression --> gliding along peroneals & lateral quad IASTM distal adductors, medial quad                                                                                                                              PATIENT EDUCATION:  Education details: HEP review; see treatment  Person educated: Patient Education method: Explanation and Handouts Education comprehension: verbalized understanding  HOME EXERCISE PROGRAM: Access Code: [email or web address] URL: [email or web address] Date: 12/15/2023 Prepared by: Carlynn Herald  Exercises - Supine Quad Set  - 1 x daily - 7 x weekly - 2 sets - 10 reps - 5 sec hold - Seated Knee Flexion AAROM  - 1 x daily - 7 x weekly - 1 sets - 10 reps - 5 sec  hold - Seated Active Assistive Knee Flexion and Extension  - 1 x daily - 7 x weekly - 1 sets - 10 reps - 5 sec  hold - Standing Terminal Knee Extension with Resistance  - 1 x daily - 7 x weekly - 2 sets - 10 reps - Sit to Stand with Armchair  - 1 x daily - 7 x weekly - 2 sets - 10 reps - Long Sitting 4 Way Patellar Glide  - 1 x daily - 7 x weekly - 3 minutes  hold - Standing Knee Flexion  - 1 x daily - 7 x weekly - 3 sets - 10 reps - Supine Knee Extension Strengthening  - 1 x daily - 7 x weekly - 3 sets - 10 reps - 5 sec hold - Standing Hip Abduction with Counter Support  - 1 x daily - 7 x weekly - 3 sets - 10 reps - Quadruped Fire  Hydrant  - 1 x daily - 7 x weekly - 3 sets - 10 reps - Quadruped Alternating Leg Extensions  - 1 x daily - 7 x weekly - 3 sets - 10 reps - Quadruped Rock Back (BKA)  - 1 x daily - 7 x weekly - 3 sets - 10 reps - Long Sitting Isometric Hip Abduction with Ball at [employer]  - 1 x daily - 7 x weekly - 3 sets - 10 reps  ASSESSMENT:  CLINICAL IMPRESSION: Continued to work on R LE strength and eccentric control today. Pt tolerated well with no increase in pain. Still has some increased R knee valgus and continues need to strengthen her glute med.   From eval: Patient is a 48 y.o. female who was seen today for physical therapy evaluation and treatment for s/p Rt knee arthroscopy, debridement of osteochondral lesion and partial lateral meniscectomy on 09/13/23. She also has a history of MS reporting balance, strength and coordination deficits on the RLE. Upon assessment she is noted to have limited Rt  knee ROM, RLE weakness, difficulty with transfers and stair negotiation, and gait abnormalities. She will benefit from skilled PT to address the above stated deficits in order to optimize her function and assist with pain reduction.   OBJECTIVE IMPAIRMENTS: Abnormal gait, decreased activity tolerance, decreased balance, decreased coordination, difficulty walking, decreased ROM, decreased strength, hypomobility, impaired sensation, improper body mechanics, postural dysfunction, and pain.   GOALS: Goals reviewed with patient? Yes  SHORT TERM GOALS: Target date: 12/01/2023  Patient will be independent and compliant with initial HEP.  Baseline: issued at eval  Goal status: MET  2.  Patient will perform sit to stand without UE support to improve ease of transfers.  Baseline: see above  11/23/23: requires use of UE support due to pain/weakness  Goal status: ongoing   3.  Patient will demonstrate at least 100 degrees of Rt knee flexion AROM to improve gait mechanics.  Baseline: see above  Goal status:  progressing    LONG TERM GOALS: Target date: 01/01/24  Patient will be able to descend stairs with reciprocal pattern with handrail support.  Baseline: step to  Goal status: INITIAL  2.  Patient will demonstrate full Rt knee extension to improve gait mechanics.  Baseline: see above  Goal status: INITIAL  3.  Patient will return to dance activity without limitation as it relates to her knee.  Baseline: currently using brace and avoiding twisting motions.  Goal status: INITIAL  4.  Patient will be independent with advanced home program to progress/maintain current level of function.  Baseline: initial HEP issued  Goal status: INITIAL  PLAN:  PT FREQUENCY: 1-2x/week  PT DURATION: 8 weeks  PLANNED INTERVENTIONS: 97164- PT Re-evaluation, 97110-Therapeutic exercises, 97530- Therapeutic activity, [id number]- Neuromuscular re-education, 97535- Self Care, 24401- Manual therapy, [id number]- Gait training, 97014- Electrical stimulation (unattended), [id number]- Electrical stimulation (manual), 97016- Vasopneumatic device, Dry Needling, Joint mobilization, and Cryotherapy  PLAN FOR NEXT SESSION: Work on knee ROM, sit to stand transfers; Youth worker. Gait training. Stair negotiation - reciprocal step down  [facility] April Ma L Levii Hairfield, PT 12/21/23 1:19 PM

## 2023-12-23 ENCOUNTER — Ambulatory Visit: Payer: Federal, State, Local not specified - PPO | Admitting: Physical Therapy

## 2023-12-23 DIAGNOSIS — M25561 Pain in right knee: Secondary | ICD-10-CM

## 2023-12-23 DIAGNOSIS — R2689 Other abnormalities of gait and mobility: Secondary | ICD-10-CM

## 2023-12-23 DIAGNOSIS — R2681 Unsteadiness on feet: Secondary | ICD-10-CM

## 2023-12-23 DIAGNOSIS — M6281 Muscle weakness (generalized): Secondary | ICD-10-CM

## 2023-12-23 NOTE — Therapy (Signed)
 OUTPATIENT PHYSICAL THERAPY LOWER EXTREMITY TREATMENT   Patient Name: Karen Oconnor MRN: [id number] DOB:1976-09-11, 48 y.o., female Today's Date: 12/23/2023  END OF SESSION:  PT End of Session - 12/23/23 1018     Visit Number 15    Date for PT Re-Evaluation 01/01/24    Authorization Type BCBS    PT Start Time 1018    PT Stop Time 1056    PT Time Calculation (min) 38 min    Activity Tolerance Patient tolerated treatment well    Behavior During Therapy WFL for tasks assessed/performed               Past Medical History:  Diagnosis Date   Diabetes (HCC)    MS (multiple sclerosis) (HCC)    Past Surgical History:  Procedure Laterality Date   ABDOMINAL HYSTERECTOMY  2016   Patient Active Problem List   Diagnosis Date Noted   Lateral meniscal tear 09/10/2023   Pain of knee joint with osteochondral injury 09/10/2023   Transverse myelitis (HCC) 02/25/2022   Multiple sclerosis (HCC) 02/25/2022   Right foot drop 02/25/2022   High risk medication use 02/25/2022   Allodynia 02/25/2022    PCP: Harvie Heck, MD  REFERRING PROVIDER: Eldred Manges, MD   REFERRING DIAG: [phone] (ICD-10-CM) - Other tear of lateral meniscus of right knee, unspecified whether old or current tear, sequela   THERAPY DIAG:  Acute pain of right knee  Muscle weakness (generalized)  Other abnormalities of gait and mobility  Unsteadiness on feet  Rationale for Evaluation and Treatment: Rehabilitation  ONSET DATE: 09/13/23  SUBJECTIVE:   SUBJECTIVE STATEMENT: Pt reports a lot of soreness after last session. Still sore today.   EVAL: Patient underwent knee surgery in November and has been doing exercises at home, but needs more help in how to progress her ROM and strength. The pain is better since the surgery. Still has pain in the knee if she twists and when she goes down the stairs. Patient was diagnosed with MS in 2013 and feels her balance, coordination, and strength is effected on the  Rt side. She does have spasticity in the calf. She also has neuropathy in both feet.   PERTINENT HISTORY: MS PAIN:  Are you having pain? Yes: NPRS scale: 3 currently/10 Pain location: Rt anterior knee Pain description: tender Aggravating factors: stair descent, twisting, excessive flexion  Relieving factors: straightening the knee   PRECAUTIONS: Fall  WEIGHT BEARING RESTRICTIONS: No  FALLS:  Has patient fallen in last 6 months? Yes. Number of falls 2; tripped over grate and fell, LOB and fell  LIVING ENVIRONMENT: Lives with: lives with their daughter Lives in: House/apartment Stairs: Yes: Internal: 2 x 7 steps; on left going up and External: 7 steps; can reach both Has following equipment at home: Single point cane, Quad cane small base, and Walker - 2 wheeled  OCCUPATION: work from home   PATIENT GOALS: "get the full ROM, guidance in exercise."   OBJECTIVE:  Note: Objective measures were completed at Evaluation unless otherwise noted.  DIAGNOSTIC FINDINGS:  Rt knee MRI: IMPRESSION: 1. Small radial tear of the lateral meniscus midbody. 2. 1.7 cm unstable osteochondral lesion of the peripheral lateral femoral condyle. 3. 4.1 cm synovial mass in the lateral joint space adjacent to the patella, possibly focal nodular synovitis. 4. 2.3 cm multiloculated cyst along the medial joint line could represent a parameniscal cyst from occult meniscal tear or ganglion cyst. 5. Mild tricompartmental osteoarthritis.  COGNITION: Overall cognitive status: Within  functional limits for tasks assessed     SENSATION: Not tested; patient endorses bilateral neuropathy   EDEMA:  Unable to inspect knee due to clothing  POSTURE: weight shifts Lt, maintains RLE abducted with foot ER.   PALPATION: TTP lateral aspect of Rt knee   LOWER EXTREMITY ROM: Active ROM Right eval Left eval Right 11/12/23 11/23/23 Right   Hip flexion      Hip extension      Hip abduction      Hip adduction       Hip internal rotation      Hip external rotation      Knee flexion 85 in sitting  92 in sitting 96 in sitting   Knee extension Lacking 4     Ankle dorsiflexion      Ankle plantarflexion      Ankle inversion      Ankle eversion       (Blank rows = not tested)  LOWER EXTREMITY MMT: MMT Right eval Left eval  Hip flexion 3- 5  Hip extension    Hip abduction    Hip adduction    Hip internal rotation    Hip external rotation    Knee flexion 4- 5  Knee extension 4- 5  Ankle dorsiflexion 3- 5  Ankle plantarflexion 3+ in sitting 5  Ankle inversion 3- 5  Ankle eversion 3- 5   (Blank rows = not tested)  FUNCTIONAL TESTS:  Sit to stand: maintains Rt knee in extension; significant use of BUE  Stair negotiation: reciprocal stair negotiation with ascent; step to descent   GAIT: Distance walked: 20 ft  Assistive device utilized: None Level of assistance: Complete Independence Comments: circumduction RLE, decreased knee flexion RLE, Rt foot ER, lateral trunk lean   [facility] Adult PT Treatment:                                                DATE: 12/23/23 Therapeutic Exercise: Prone quad stretch with strap 3x30" Prone hamstring curl x10 Prone hip IR/ER x10 Prone hip abd 2x10 Prone quad set 2x10 Gastroc/soleus stretch with strap 3x30" Standing calf stretch 2x30" Manual Therapy: STM & TPR R glute med/max, quads Grade II to III talocrural ankle mobs for DF Grade II to III calcaneal mobs for ankle DF Grade II to III tibia/fibula Pas/APs   [facility] Adult PT Treatment:                                                DATE: 12/21/23 Therapeutic Exercise: Nustep L6 x 5 min UEs/LEs Neuromuscular re-ed: Leg press 65# double legs 2x10 focusing on decreasing R knee valgus Leg press 40# single leg 2x10 slow and controlled for eccentric motion Therapeutic Activity: Resisted walking forward, L, R x10 at 10#   [facility] Adult PT Treatment:                                                DATE:  12/15/2023 Therapeutic Exercise: Recumbent bike L1 x 5 min + subjective intake Seated AAROM straight leg raises w/strap --> added abd/add raises over  towel Seated knee flexion stretch STS from airex x 6 Neuromuscular re-ed: Long sitting: Quad set 2x10 --> gait belt around shins for R LE alignment/support, towel behind R knee to decrease hyperextension Straight leg hip abd isometric with gait belt around shins 2x10 Supine AAROM hip abd/add with straight leg --> functional hip strengthening 2x10 Therapeutic Activity: Line dancing activities, challenging eccentric control: Resisted side stepping + RTB crossed at ankles Resisted diagonal stepping + YTB crossed at ankles                                                                                                                              PATIENT EDUCATION:  Education details: HEP review; see treatment  Person educated: Patient Education method: Explanation and Handouts Education comprehension: verbalized understanding  HOME EXERCISE PROGRAM: Access Code: [email or web address] URL: [email or web address] Date: 12/15/2023 Prepared by: Carlynn Herald  Exercises - Supine Quad Set  - 1 x daily - 7 x weekly - 2 sets - 10 reps - 5 sec hold - Seated Knee Flexion AAROM  - 1 x daily - 7 x weekly - 1 sets - 10 reps - 5 sec  hold - Seated Active Assistive Knee Flexion and Extension  - 1 x daily - 7 x weekly - 1 sets - 10 reps - 5 sec  hold - Standing Terminal Knee Extension with Resistance  - 1 x daily - 7 x weekly - 2 sets - 10 reps - Sit to Stand with Armchair  - 1 x daily - 7 x weekly - 2 sets - 10 reps - Long Sitting 4 Way Patellar Glide  - 1 x daily - 7 x weekly - 3 minutes  hold - Standing Knee Flexion  - 1 x daily - 7 x weekly - 3 sets - 10 reps - Supine Knee Extension Strengthening  - 1 x daily - 7 x weekly - 3 sets - 10 reps - 5 sec hold - Standing Hip Abduction with Counter Support  - 1 x daily - 7 x weekly - 3 sets - 10 reps -  Quadruped Fire Hydrant  - 1 x daily - 7 x weekly - 3 sets - 10 reps - Quadruped Alternating Leg Extensions  - 1 x daily - 7 x weekly - 3 sets - 10 reps - Quadruped Rock Back (BKA)  - 1 x daily - 7 x weekly - 3 sets - 10 reps - Long Sitting Isometric Hip Abduction with Ball at [employer]  - 1 x daily - 7 x weekly - 3 sets - 10 reps  ASSESSMENT:  CLINICAL IMPRESSION: Pt remains very sore from prior session. Primarily worked on stretching her sore muscles and improving ROM. Found pt to have very hypomobile R ankle DF leading pt to externally rotate and pronate during weightbearing likely increasing her medial knee pain. Provided manual work and stretches to try and address this to improve ankle, knee and hip alignment. Continued hip abductor  strengthening.   From eval: Patient is a 48 y.o. female who was seen today for physical therapy evaluation and treatment for s/p Rt knee arthroscopy, debridement of osteochondral lesion and partial lateral meniscectomy on 09/13/23. She also has a history of MS reporting balance, strength and coordination deficits on the RLE. Upon assessment she is noted to have limited Rt knee ROM, RLE weakness, difficulty with transfers and stair negotiation, and gait abnormalities. She will benefit from skilled PT to address the above stated deficits in order to optimize her function and assist with pain reduction.   OBJECTIVE IMPAIRMENTS: Abnormal gait, decreased activity tolerance, decreased balance, decreased coordination, difficulty walking, decreased ROM, decreased strength, hypomobility, impaired sensation, improper body mechanics, postural dysfunction, and pain.   GOALS: Goals reviewed with patient? Yes  SHORT TERM GOALS: Target date: 12/01/2023  Patient will be independent and compliant with initial HEP.  Baseline: issued at eval  Goal status: MET  2.  Patient will perform sit to stand without UE support to improve ease of transfers.  Baseline: see above  11/23/23:  requires use of UE support due to pain/weakness  Goal status: ongoing   3.  Patient will demonstrate at least 100 degrees of Rt knee flexion AROM to improve gait mechanics.  Baseline: see above  Goal status: progressing    LONG TERM GOALS: Target date: 01/01/24  Patient will be able to descend stairs with reciprocal pattern with handrail support.  Baseline: step to  Goal status: INITIAL  2.  Patient will demonstrate full Rt knee extension to improve gait mechanics.  Baseline: see above  Goal status: INITIAL  3.  Patient will return to dance activity without limitation as it relates to her knee.  Baseline: currently using brace and avoiding twisting motions.  Goal status: INITIAL  4.  Patient will be independent with advanced home program to progress/maintain current level of function.  Baseline: initial HEP issued  Goal status: INITIAL  PLAN:  PT FREQUENCY: 1-2x/week  PT DURATION: 8 weeks  PLANNED INTERVENTIONS: 97164- PT Re-evaluation, 97110-Therapeutic exercises, 97530- Therapeutic activity, [id number]- Neuromuscular re-education, 97535- Self Care, [id number]- Manual therapy, [id number]- Gait training, 97014- Electrical stimulation (unattended), [id number]- Electrical stimulation (manual), 97016- Vasopneumatic device, Dry Needling, Joint mobilization, and Cryotherapy  PLAN FOR NEXT SESSION: Mobilize and stretch R ankle! Work on knee ROM, sit to stand transfers; Youth worker. Gait training. Stair negotiation - reciprocal step down  [facility] April Dell Ponto, PT, DPT 12/23/23 10:18 AM

## 2023-12-28 ENCOUNTER — Ambulatory Visit: Payer: Federal, State, Local not specified - PPO | Attending: Orthopaedic Surgery

## 2023-12-28 DIAGNOSIS — R2681 Unsteadiness on feet: Secondary | ICD-10-CM | POA: Diagnosis present

## 2023-12-28 DIAGNOSIS — M6281 Muscle weakness (generalized): Secondary | ICD-10-CM | POA: Diagnosis present

## 2023-12-28 DIAGNOSIS — M25561 Pain in right knee: Secondary | ICD-10-CM | POA: Insufficient documentation

## 2023-12-28 DIAGNOSIS — R2689 Other abnormalities of gait and mobility: Secondary | ICD-10-CM | POA: Insufficient documentation

## 2023-12-28 NOTE — Therapy (Signed)
 OUTPATIENT PHYSICAL THERAPY LOWER EXTREMITY TREATMENT   Patient Name: Shakiera Edelson MRN: [id number] DOB:1976-04-12, 48 y.o., female Today's Date: 12/28/2023  END OF SESSION:  PT End of Session - 12/28/23 0938     Visit Number 16    Number of Visits 17    Date for PT Re-Evaluation 01/01/24    Authorization Type BCBS    PT Start Time [phone]   pt arrived late   PT Stop Time 1020    PT Time Calculation (min) 42 min    Activity Tolerance Patient tolerated treatment well    Behavior During Therapy [facility] for tasks assessed/performed            Past Medical History:  Diagnosis Date   Diabetes (HCC)    MS (multiple sclerosis) (HCC)    Past Surgical History:  Procedure Laterality Date   ABDOMINAL HYSTERECTOMY  2016   Patient Active Problem List   Diagnosis Date Noted   Lateral meniscal tear 09/10/2023   Pain of knee joint with osteochondral injury 09/10/2023   Transverse myelitis (HCC) 02/25/2022   Multiple sclerosis (HCC) 02/25/2022   Right foot drop 02/25/2022   High risk medication use 02/25/2022   Allodynia 02/25/2022    PCP: Harvie Heck, MD  REFERRING PROVIDER: Eldred Manges, MD   REFERRING DIAG: [phone] (ICD-10-CM) - Other tear of lateral meniscus of right knee, unspecified whether old or current tear, sequela   THERAPY DIAG:  Acute pain of right knee  Muscle weakness (generalized)  Other abnormalities of gait and mobility  Unsteadiness on feet  Rationale for Evaluation and Treatment: Rehabilitation  ONSET DATE: 09/13/23  SUBJECTIVE:   SUBJECTIVE STATEMENT: Patient reports her knee is feeling better, states the manual treatment performed at last session felt really good and allowed for more ankle mobility. Patient states she is able to tolerate walking up stairs with reciprocal pattern again.  EVAL: Patient underwent knee surgery in November and has been doing exercises at home, but needs more help in how to progress her ROM and strength. The pain is  better since the surgery. Still has pain in the knee if she twists and when she goes down the stairs. Patient was diagnosed with MS in 2013 and feels her balance, coordination, and strength is effected on the Rt side. She does have spasticity in the calf. She also has neuropathy in both feet.   PERTINENT HISTORY: MS PAIN:  Are you having pain? Yes: NPRS scale: 3 currently/10 Pain location: Rt anterior knee Pain description: tender Aggravating factors: stair descent, twisting, excessive flexion  Relieving factors: straightening the knee   PRECAUTIONS: Fall  WEIGHT BEARING RESTRICTIONS: No  FALLS:  Has patient fallen in last 6 months? Yes. Number of falls 2; tripped over grate and fell, LOB and fell  LIVING ENVIRONMENT: Lives with: lives with their daughter Lives in: House/apartment Stairs: Yes: Internal: 2 x 7 steps; on left going up and External: 7 steps; can reach both Has following equipment at home: Single point cane, Quad cane small base, and Walker - 2 wheeled  OCCUPATION: work from home   PATIENT GOALS: "get the full ROM, guidance in exercise."   OBJECTIVE:  Note: Objective measures were completed at Evaluation unless otherwise noted.  DIAGNOSTIC FINDINGS:  Rt knee MRI: IMPRESSION: 1. Small radial tear of the lateral meniscus midbody. 2. 1.7 cm unstable osteochondral lesion of the peripheral lateral femoral condyle. 3. 4.1 cm synovial mass in the lateral joint space adjacent to the patella, possibly focal nodular  synovitis. 4. 2.3 cm multiloculated cyst along the medial joint line could represent a parameniscal cyst from occult meniscal tear or ganglion cyst. 5. Mild tricompartmental osteoarthritis.  COGNITION: Overall cognitive status: Within functional limits for tasks assessed     SENSATION: Not tested; patient endorses bilateral neuropathy   EDEMA:  Unable to inspect knee due to clothing  POSTURE: weight shifts Lt, maintains RLE abducted with foot ER.    PALPATION: TTP lateral aspect of Rt knee   LOWER EXTREMITY ROM: Active ROM Right eval Left eval Right 11/12/23 11/23/23 Right   Hip flexion      Hip extension      Hip abduction      Hip adduction      Hip internal rotation      Hip external rotation      Knee flexion 85 in sitting  92 in sitting 96 in sitting   Knee extension Lacking 4     Ankle dorsiflexion      Ankle plantarflexion      Ankle inversion      Ankle eversion       (Blank rows = not tested)  LOWER EXTREMITY MMT: MMT Right eval Left eval  Hip flexion 3- 5  Hip extension    Hip abduction    Hip adduction    Hip internal rotation    Hip external rotation    Knee flexion 4- 5  Knee extension 4- 5  Ankle dorsiflexion 3- 5  Ankle plantarflexion 3+ in sitting 5  Ankle inversion 3- 5  Ankle eversion 3- 5   (Blank rows = not tested)  FUNCTIONAL TESTS:  Sit to stand: maintains Rt knee in extension; significant use of BUE  Stair negotiation: reciprocal stair negotiation with ascent; step to descent   GAIT: Distance walked: 20 ft  Assistive device utilized: None Level of assistance: Complete Independence Comments: circumduction RLE, decreased knee flexion RLE, Rt foot ER, lateral trunk lean   [facility] Adult PT Treatment:                                                DATE: 12/28/2023 Therapeutic Exercise: Henreitta Leber + hip add iso (ball b/w knees) 2x10 SAQ + assisted DF at end range extension x10 Prone: HS curls (assist for alignment) x10 --> added quad isometric at end range 5x3" Hip IR/ER x10 Hip abd x10 Quad stretch w/strap 3x30" Neuromuscular re-ed: Active assist for ankle DF mechanics/mobility: Seated rocker board --> focus on DF Seated assisted foot articulation --> assisted heel raises with deficit on yoga block Isometric ankle PF (blocking rocker board) Isometric outer heel press into block (glute activation) + ball b/w knees for stabilization    [facility] Adult PT Treatment:                                                 DATE: 12/23/23 Therapeutic Exercise: Prone quad stretch with strap 3x30" Prone hamstring curl x10 Prone hip IR/ER x10 Prone hip abd 2x10 Prone quad set 2x10 Gastroc/soleus stretch with strap 3x30" Standing calf stretch 2x30" Manual Therapy: STM & TPR R glute med/max, quads Grade II to III talocrural ankle mobs for DF Grade II to III calcaneal mobs for ankle DF  Grade II to III tibia/fibula Pas/APs   [facility] Adult PT Treatment:                                                DATE: 12/21/23 Therapeutic Exercise: Nustep L6 x 5 min UEs/LEs Neuromuscular re-ed: Leg press 65# double legs 2x10 focusing on decreasing R knee valgus Leg press 40# single leg 2x10 slow and controlled for eccentric motion Therapeutic Activity: Resisted walking forward, L, R x10 at 10#                                                                                                                              PATIENT EDUCATION:  Education details: HEP review; see treatment  Person educated: Patient Education method: Explanation and Handouts Education comprehension: verbalized understanding  HOME EXERCISE PROGRAM: Access Code: [email or web address] URL: [email or web address] Date: 12/15/2023 Prepared by: Carlynn Herald  Exercises - Supine Quad Set  - 1 x daily - 7 x weekly - 2 sets - 10 reps - 5 sec hold - Seated Knee Flexion AAROM  - 1 x daily - 7 x weekly - 1 sets - 10 reps - 5 sec  hold - Seated Active Assistive Knee Flexion and Extension  - 1 x daily - 7 x weekly - 1 sets - 10 reps - 5 sec  hold - Standing Terminal Knee Extension with Resistance  - 1 x daily - 7 x weekly - 2 sets - 10 reps - Sit to Stand with Armchair  - 1 x daily - 7 x weekly - 2 sets - 10 reps - Long Sitting 4 Way Patellar Glide  - 1 x daily - 7 x weekly - 3 minutes  hold - Standing Knee Flexion  - 1 x daily - 7 x weekly - 3 sets - 10 reps - Supine Knee Extension Strengthening  - 1 x daily - 7 x weekly - 3 sets -  10 reps - 5 sec hold - Standing Hip Abduction with Counter Support  - 1 x daily - 7 x weekly - 3 sets - 10 reps - Quadruped Fire Hydrant  - 1 x daily - 7 x weekly - 3 sets - 10 reps - Quadruped Alternating Leg Extensions  - 1 x daily - 7 x weekly - 3 sets - 10 reps - Quadruped Rock Back (BKA)  - 1 x daily - 7 x weekly - 3 sets - 10 reps - Long Sitting Isometric Hip Abduction with Ball at [employer]  - 1 x daily - 7 x weekly - 3 sets - 10 reps  ASSESSMENT:  CLINICAL IMPRESSION: Session focused on active assist ankle mobility (DF>PF) and continuing quad strength progression. Improved quad and glute activation on R LE noted during prone exercises. Tactile cues/assist provided occasionally to maintain proper alignment with R LE  during knee open chain exercises. Patient will continue to benefit from skilled therapy to progress knee strength, stair navigation and safe navigation of community.   From eval: Patient is a 48 y.o. female who was seen today for physical therapy evaluation and treatment for s/p Rt knee arthroscopy, debridement of osteochondral lesion and partial lateral meniscectomy on 09/13/23. She also has a history of MS reporting balance, strength and coordination deficits on the RLE. Upon assessment she is noted to have limited Rt knee ROM, RLE weakness, difficulty with transfers and stair negotiation, and gait abnormalities. She will benefit from skilled PT to address the above stated deficits in order to optimize her function and assist with pain reduction.   OBJECTIVE IMPAIRMENTS: Abnormal gait, decreased activity tolerance, decreased balance, decreased coordination, difficulty walking, decreased ROM, decreased strength, hypomobility, impaired sensation, improper body mechanics, postural dysfunction, and pain.   GOALS: Goals reviewed with patient? Yes  SHORT TERM GOALS: Target date: 12/01/2023  Patient will be independent and compliant with initial HEP.  Baseline: issued at eval  Goal  status: MET  2.  Patient will perform sit to stand without UE support to improve ease of transfers.  Baseline: see above  11/23/23: requires use of UE support due to pain/weakness  Goal status: ongoing   3.  Patient will demonstrate at least 100 degrees of Rt knee flexion AROM to improve gait mechanics.  Baseline: see above  Goal status: progressing    LONG TERM GOALS: Target date: 01/01/24  Patient will be able to descend stairs with reciprocal pattern with handrail support.  Baseline: step to  Goal status: INITIAL  2.  Patient will demonstrate full Rt knee extension to improve gait mechanics.  Baseline: see above  Goal status: INITIAL  3.  Patient will return to dance activity without limitation as it relates to her knee.  Baseline: currently using brace and avoiding twisting motions.  Goal status: INITIAL  4.  Patient will be independent with advanced home program to progress/maintain current level of function.  Baseline: initial HEP issued  Goal status: INITIAL  PLAN:  PT FREQUENCY: 1-2x/week  PT DURATION: 8 weeks  PLANNED INTERVENTIONS: 97164- PT Re-evaluation, 97110-Therapeutic exercises, 97530- Therapeutic activity, 97112- Neuromuscular re-education, 97535- Self Care, 44034- Manual therapy, [id number]- Gait training, 97014- Electrical stimulation (unattended), [id number]- Electrical stimulation (manual), 97016- Vasopneumatic device, Dry Needling, Joint mobilization, and Cryotherapy  PLAN FOR NEXT SESSION: Re-eval next visit. Mobilize and stretch R ankle! Work on knee ROM, sit to stand transfers; Youth worker. Gait training. Stair negotiation - reciprocal step down  Sanjuana Mae, PTA 12/28/23 10:28 AM

## 2023-12-31 ENCOUNTER — Ambulatory Visit: Payer: Federal, State, Local not specified - PPO

## 2024-01-03 NOTE — Therapy (Signed)
 OUTPATIENT PHYSICAL THERAPY LOWER EXTREMITY TREATMENT   Patient Name: Karen Oconnor MRN: [id number] DOB:07/05/1976, 48 y.o., female Today's Date: 01/04/2024  END OF SESSION:  PT End of Session - 01/04/24 0802     Visit Number 17    Number of Visits 29    Date for PT Re-Evaluation 02/19/24    Authorization Type BCBS    PT Start Time 0802    PT Stop Time 0840    PT Time Calculation (min) 38 min    Activity Tolerance Patient tolerated treatment well    Behavior During Therapy [facility] for tasks assessed/performed             Past Medical History:  Diagnosis Date   Diabetes (HCC)    MS (multiple sclerosis) (HCC)    Past Surgical History:  Procedure Laterality Date   ABDOMINAL HYSTERECTOMY  2016   Patient Active Problem List   Diagnosis Date Noted   Lateral meniscal tear 09/10/2023   Pain of knee joint with osteochondral injury 09/10/2023   Transverse myelitis (HCC) 02/25/2022   Multiple sclerosis (HCC) 02/25/2022   Right foot drop 02/25/2022   High risk medication use 02/25/2022   Allodynia 02/25/2022    PCP: Harvie Heck, MD  REFERRING PROVIDER: Eldred Manges, MD   REFERRING DIAG: [phone] (ICD-10-CM) - Other tear of lateral meniscus of right knee, unspecified whether old or current tear, sequela   THERAPY DIAG:  Acute pain of right knee  Muscle weakness (generalized)  Other abnormalities of gait and mobility  Rationale for Evaluation and Treatment: Rehabilitation  ONSET DATE: 09/13/23  SUBJECTIVE:   SUBJECTIVE STATEMENT: Patient reports the knee continues to feel better. The ankle is feeling tight and feels that this is affecting the entire RLE. Going up the stairs is easier, but still has difficulty with stair descent. Has not danced since February as this seemed to aggravate the knee, so she has been trying to give the knee a rest from dance before trying again.   EVAL: Patient underwent knee surgery in November and has been doing exercises at home, but  needs more help in how to progress her ROM and strength. The pain is better since the surgery. Still has pain in the knee if she twists and when she goes down the stairs. Patient was diagnosed with MS in 2013 and feels her balance, coordination, and strength is effected on the Rt side. She does have spasticity in the calf. She also has neuropathy in both feet.   PERTINENT HISTORY: MS PAIN:  Are you having pain? Yes: NPRS scale: none currently; 3-4 at worst/10 Pain location: Rt anterior knee Pain description: tender Aggravating factors: transitioning to standing after prolonged sitting   Relieving factors: straightening the knee   PRECAUTIONS: Fall  WEIGHT BEARING RESTRICTIONS: No  FALLS:  Has patient fallen in last 6 months? Yes. Number of falls 2; tripped over grate and fell, LOB and fell  LIVING ENVIRONMENT: Lives with: lives with their daughter Lives in: House/apartment Stairs: Yes: Internal: 2 x 7 steps; on left going up and External: 7 steps; can reach both Has following equipment at home: Single point cane, Quad cane small base, and Walker - 2 wheeled  OCCUPATION: work from home   PATIENT GOALS: "get the full ROM, guidance in exercise."   OBJECTIVE:  Note: Objective measures were completed at Evaluation unless otherwise noted.  DIAGNOSTIC FINDINGS:  Rt knee MRI: IMPRESSION: 1. Small radial tear of the lateral meniscus midbody. 2. 1.7 cm unstable osteochondral  lesion of the peripheral lateral femoral condyle. 3. 4.1 cm synovial mass in the lateral joint space adjacent to the patella, possibly focal nodular synovitis. 4. 2.3 cm multiloculated cyst along the medial joint line could represent a parameniscal cyst from occult meniscal tear or ganglion cyst. 5. Mild tricompartmental osteoarthritis.  COGNITION: Overall cognitive status: Within functional limits for tasks assessed     SENSATION: Not tested; patient endorses bilateral neuropathy   EDEMA:  Unable to  inspect knee due to clothing  POSTURE: weight shifts Lt, maintains RLE abducted with foot ER.   PALPATION: TTP lateral aspect of Rt knee   LOWER EXTREMITY ROM: Active ROM Right eval Left eval Right 11/12/23 11/23/23 Right  01/04/24 Right   Hip flexion       Hip extension       Hip abduction       Hip adduction       Hip internal rotation       Hip external rotation       Knee flexion 85 in sitting  92 in sitting 96 in sitting  100 in sitting   Knee extension Lacking 4    Lacking 2  Ankle dorsiflexion       Ankle plantarflexion       Ankle inversion       Ankle eversion        (Blank rows = not tested)  LOWER EXTREMITY MMT: MMT Right eval Left eval 01/04/24 Right   Hip flexion 3- 5 3+  Hip extension     Hip abduction     Hip adduction     Hip internal rotation     Hip external rotation     Knee flexion 4- 5 4+  Knee extension 4- 5 4+  Ankle dorsiflexion 3- 5 3-  Ankle plantarflexion 3+ in sitting 5 3+ in sitting   Ankle inversion 3- 5 3+  Ankle eversion 3- 5 3-   (Blank rows = not tested)  FUNCTIONAL TESTS:  Sit to stand: maintains Rt knee in extension; significant use of BUE  Stair negotiation: reciprocal stair negotiation with ascent; step to descent   GAIT: Distance walked: 20 ft  Assistive device utilized: None Level of assistance: Complete Independence Comments: circumduction RLE, decreased knee flexion RLE, Rt foot ER, lateral trunk lean  [facility] Adult PT Treatment:                                                DATE: 01/04/24 Therapeutic Exercise: Seated toe raises x 10  LAQ x 10 Updated HEP  Manual Therapy: Grade III-IV talocrural ankle mobs for DF Grade III calcaneal mobs for ankle DF Grade II to III tibia/fibula Pas/Aps Rt foot PROM with gentle distraction   Therapeutic Activity: Re-assessment to determine overall progress, educating patient on progress towards goals.  Sit to stand with facilitation from PT to maintain foot flat x 10    [facility]  Adult PT Treatment:                                                DATE: 12/28/2023 Therapeutic Exercise: Henreitta Leber + hip add iso (ball b/w knees) 2x10 SAQ + assisted DF at end range extension x10 Prone: HS  curls (assist for alignment) x10 --> added quad isometric at end range 5x3" Hip IR/ER x10 Hip abd x10 Quad stretch w/strap 3x30" Neuromuscular re-ed: Active assist for ankle DF mechanics/mobility: Seated rocker board --> focus on DF Seated assisted foot articulation --> assisted heel raises with deficit on yoga block Isometric ankle PF (blocking rocker board) Isometric outer heel press into block (glute activation) + ball b/w knees for stabilization    [facility] Adult PT Treatment:                                                DATE: 12/23/23 Therapeutic Exercise: Prone quad stretch with strap 3x30" Prone hamstring curl x10 Prone hip IR/ER x10 Prone hip abd 2x10 Prone quad set 2x10 Gastroc/soleus stretch with strap 3x30" Standing calf stretch 2x30" Manual Therapy: STM & TPR R glute med/max, quads Grade II to III talocrural ankle mobs for DF Grade II to III calcaneal mobs for ankle DF Grade II to III tibia/fibula Pas/APs                                                                                                                               PATIENT EDUCATION:  Education details: HEP update Person educated: Patient Education method: Chief Technology Officer, demo  Education comprehension: verbalized understanding, returned demo  HOME EXERCISE PROGRAM: Access Code: [email or web address] URL: [email or web address] Date: 01/04/2024 Prepared by: Letitia Libra  Exercises - Supine Quad Set  - 1 x daily - 7 x weekly - 2 sets - 10 reps - 5 sec hold - Seated Knee Flexion AAROM  - 1 x daily - 7 x weekly - 1 sets - 10 reps - 5 sec  hold - Sit to Stand with Armchair  - 1 x daily - 7 x weekly - 2 sets - 10 reps - Standing Hip Abduction with Counter Support  - 1 x daily - 7 x weekly -  3 sets - 10 reps - Quadruped Fire Hydrant  - 1 x daily - 7 x weekly - 3 sets - 10 reps - Quadruped Alternating Leg Extensions  - 1 x daily - 7 x weekly - 3 sets - 10 reps - Quadruped Rock Back (BKA)  - 1 x daily - 7 x weekly - 1 sets - 10 reps - 5-10 sec hold - Long Sitting Isometric Hip Abduction with Ball at [employer]  - 1 x daily - 7 x weekly - 3 sets - 10 reps - Gastroc Stretch on Wall  - 1 x daily - 7 x weekly - 2 sets - 30 sec hold - Seated Long Arc Quad  - 1 x daily - 7 x weekly - 2 sets - 10 reps - Seated Toe Raise  - 1 x daily - 7 x weekly - 2 sets - 10 reps  ASSESSMENT:  CLINICAL IMPRESSION: Patient is making steady progress in PT s/p Rt knee arthroscopy, debridement of osteochondral lesion and partial lateral meniscectomy on 09/13/23. She demonstrates overall improvements in knee ROM and RLE strength since the start of care. She continues to have slight limitation in knee ROM and RLE weakness, though multiple sclerosis is also a factor in her strength and ROM deficits. Despite factors associated with multiple sclerosis she has still not returned to her baseline following Rt knee arthroscopy as Rt knee pain and decreased RLE strength and stability have hindered her from returning to full dance activity and negotiating stairs with reciprocal pattern. She will benefit from continued skilled PT to further progress her strength, ROM, and balance in order to return to PLOF.    From eval: Patient is a 48 y.o. female who was seen today for physical therapy evaluation and treatment for s/p Rt knee arthroscopy, debridement of osteochondral lesion and partial lateral meniscectomy on 09/13/23. She also has a history of MS reporting balance, strength and coordination deficits on the RLE. Upon assessment she is noted to have limited Rt knee ROM, RLE weakness, difficulty with transfers and stair negotiation, and gait abnormalities. She will benefit from skilled PT to address the above stated deficits in order  to optimize her function and assist with pain reduction.   OBJECTIVE IMPAIRMENTS: Abnormal gait, decreased activity tolerance, decreased balance, decreased coordination, difficulty walking, decreased ROM, decreased strength, hypomobility, impaired sensation, improper body mechanics, postural dysfunction, and pain.   GOALS: Goals reviewed with patient? Yes  SHORT TERM GOALS: Target date: 12/01/2023  Patient will be independent and compliant with initial HEP.  Baseline: issued at eval  Goal status: MET  2.  Patient will perform sit to stand without UE support to improve ease of transfers.  Baseline: see above  11/23/23: requires use of UE support due to pain/weakness  01/04/24: no use of UE support  Goal status: MET   3.  Patient will demonstrate at least 100 degrees of Rt knee flexion AROM to improve gait mechanics.  Baseline: see above  Goal status: MET    LONG TERM GOALS: Target date: 02/19/24  Patient will be able to descend stairs with reciprocal pattern with handrail support.  Baseline: step to  01/04/24: step to descent  Goal status: ongoing   2.  Patient will demonstrate full Rt knee extension to improve gait mechanics.  Baseline: see above  Goal status: progressing   3.  Patient will return to dance activity without limitation as it relates to her knee.  Baseline: currently using brace and avoiding twisting motions.  01/04/24: attempted previous dance activity, but this aggravated the knee  Goal status: progressing  4.  Patient will be independent with advanced home program to progress/maintain current level of function.  Baseline: initial HEP issued  Goal status: progressing   PLAN:  PT FREQUENCY: 2x/week  PT DURATION: 6 weeks  PLANNED INTERVENTIONS: 97164- PT Re-evaluation, 97110-Therapeutic exercises, 97530- Therapeutic activity, 97112- Neuromuscular re-education, 97535- Self Care, 40981- Manual therapy, [id number]- Gait training, 97014- Electrical stimulation  (unattended), [id number]- Electrical stimulation (manual), 97016- Vasopneumatic device, Dry Needling, Joint mobilization, and Cryotherapy  PLAN FOR NEXT SESSION: Mobilize and stretch Rt ankle! Work on knee ROM, sit to stand transfers; Youth worker. Gait training. Stair negotiation - reciprocal step down, activities to allow for full foot contact with closed chain tasks.   Letitia Libra, PT, DPT, ATC 01/04/24 9:01 AM

## 2024-01-04 ENCOUNTER — Ambulatory Visit: Payer: Federal, State, Local not specified - PPO

## 2024-01-04 DIAGNOSIS — M6281 Muscle weakness (generalized): Secondary | ICD-10-CM

## 2024-01-04 DIAGNOSIS — M25561 Pain in right knee: Secondary | ICD-10-CM

## 2024-01-04 DIAGNOSIS — R2689 Other abnormalities of gait and mobility: Secondary | ICD-10-CM

## 2024-01-06 ENCOUNTER — Ambulatory Visit: Payer: Federal, State, Local not specified - PPO

## 2024-01-11 ENCOUNTER — Ambulatory Visit: Payer: Federal, State, Local not specified - PPO

## 2024-01-11 DIAGNOSIS — M25561 Pain in right knee: Secondary | ICD-10-CM

## 2024-01-11 DIAGNOSIS — R2689 Other abnormalities of gait and mobility: Secondary | ICD-10-CM

## 2024-01-11 DIAGNOSIS — M6281 Muscle weakness (generalized): Secondary | ICD-10-CM

## 2024-01-11 NOTE — Therapy (Signed)
 OUTPATIENT PHYSICAL THERAPY LOWER EXTREMITY TREATMENT   Patient Name: Karen Oconnor MRN: [id number] DOB:[birth year], 48 y.o., female Today's Date: 01/11/2024  END OF SESSION:  PT End of Session - 01/11/24 1016     Visit Number 18    Number of Visits 29    Date for PT Re-Evaluation 02/19/24    Authorization Type BCBS    PT Start Time 1015    PT Stop Time 1055    PT Time Calculation (min) 40 min    Activity Tolerance Patient tolerated treatment well    Behavior During Therapy WFL for tasks assessed/performed              Past Medical History:  Diagnosis Date   Diabetes (HCC)    MS (multiple sclerosis) (HCC)    Past Surgical History:  Procedure Laterality Date   ABDOMINAL HYSTERECTOMY  2016   Patient Active Problem List   Diagnosis Date Noted   Lateral meniscal tear 09/10/2023   Pain of knee joint with osteochondral injury 09/10/2023   Transverse myelitis (HCC) 02/25/2022   Multiple sclerosis (HCC) 02/25/2022   Right foot drop 02/25/2022   High risk medication use 02/25/2022   Allodynia 02/25/2022    PCP: Harvie Heck, MD  REFERRING PROVIDER: Eldred Manges, MD   REFERRING DIAG: [phone] (ICD-10-CM) - Other tear of lateral meniscus of right knee, unspecified whether old or current tear, sequela   THERAPY DIAG:  Acute pain of right knee  Muscle weakness (generalized)  Other abnormalities of gait and mobility  Rationale for Evaluation and Treatment: Rehabilitation  ONSET DATE: 09/13/23  SUBJECTIVE:   SUBJECTIVE STATEMENT: Patient reports the knee is feeling good.   EVAL: Patient underwent knee surgery in November and has been doing exercises at home, but needs more help in how to progress her ROM and strength. The pain is better since the surgery. Still has pain in the knee if she twists and when she goes down the stairs. Patient was diagnosed with MS in 2013 and feels her balance, coordination, and strength is effected on the Rt side. She does have  spasticity in the calf. She also has neuropathy in both feet.   PERTINENT HISTORY: MS PAIN:  Are you having pain? Yes: NPRS scale: none currently; 1-2 at worst/10 Pain location: Rt anterior knee Pain description: tender Aggravating factors: transitioning to standing after prolonged sitting   Relieving factors: straightening the knee   PRECAUTIONS: Fall  WEIGHT BEARING RESTRICTIONS: No  FALLS:  Has patient fallen in last 6 months? Yes. Number of falls 2; tripped over grate and fell, LOB and fell  LIVING ENVIRONMENT: Lives with: lives with their daughter Lives in: House/apartment Stairs: Yes: Internal: 2 x 7 steps; on left going up and External: 7 steps; can reach both Has following equipment at home: Single point cane, Quad cane small base, and Walker - 2 wheeled  OCCUPATION: work from home   PATIENT GOALS: "get the full ROM, guidance in exercise."   OBJECTIVE:  Note: Objective measures were completed at Evaluation unless otherwise noted.  DIAGNOSTIC FINDINGS:  Rt knee MRI: IMPRESSION: 1. Small radial tear of the lateral meniscus midbody. 2. 1.7 cm unstable osteochondral lesion of the peripheral lateral femoral condyle. 3. 4.1 cm synovial mass in the lateral joint space adjacent to the patella, possibly focal nodular synovitis. 4. 2.3 cm multiloculated cyst along the medial joint line could represent a parameniscal cyst from occult meniscal tear or ganglion cyst. 5. Mild tricompartmental osteoarthritis.  COGNITION: Overall cognitive  status: Within functional limits for tasks assessed     SENSATION: Not tested; patient endorses bilateral neuropathy   EDEMA:  Unable to inspect knee due to clothing  POSTURE: weight shifts Lt, maintains RLE abducted with foot ER.   PALPATION: TTP lateral aspect of Rt knee   LOWER EXTREMITY ROM: Active ROM Right eval Left eval Right 11/12/23 11/23/23 Right  01/04/24 Right   Hip flexion       Hip extension       Hip abduction        Hip adduction       Hip internal rotation       Hip external rotation       Knee flexion 85 in sitting  92 in sitting 96 in sitting  100 in sitting   Knee extension Lacking 4    Lacking 2  Ankle dorsiflexion       Ankle plantarflexion       Ankle inversion       Ankle eversion        (Blank rows = not tested)  LOWER EXTREMITY MMT: MMT Right eval Left eval 01/04/24 Right   Hip flexion 3- 5 3+  Hip extension     Hip abduction     Hip adduction     Hip internal rotation     Hip external rotation     Knee flexion 4- 5 4+  Knee extension 4- 5 4+  Ankle dorsiflexion 3- 5 3-  Ankle plantarflexion 3+ in sitting 5 3+ in sitting   Ankle inversion 3- 5 3+  Ankle eversion 3- 5 3-   (Blank rows = not tested)  FUNCTIONAL TESTS:  Sit to stand: maintains Rt knee in extension; significant use of BUE  Stair negotiation: reciprocal stair negotiation with ascent; step to descent   GAIT: Distance walked: 20 ft  Assistive device utilized: None Level of assistance: Complete Independence Comments: circumduction RLE, decreased knee flexion RLE, Rt foot ER, lateral trunk lean [facility] Adult PT Treatment:                                                DATE: 01/11/24  Manual Therapy: Demo and returned demo for MWM to improve DF, with handout provided to include as HEP  Grade III-IV talocrural ankle mobs for DF Grade II to III tibia/fibula Pas/Aps Rt foot PROM with gentle distraction   Therapeutic Activity: Step down lateral 4 inch 2 x 10  Step down forward 2 inch 2 x 10  Staggered sit to stand with overpressure from PT for foot contact 2 x 10    [facility] Adult PT Treatment:                                                DATE: 01/04/24 Therapeutic Exercise: Seated toe raises x 10  LAQ x 10 Updated HEP  Manual Therapy: Grade III-IV talocrural ankle mobs for DF Grade III calcaneal mobs for ankle DF Grade II to III tibia/fibula Pas/Aps Rt foot PROM with gentle distraction   Therapeutic  Activity: Re-assessment to determine overall progress, educating patient on progress towards goals.  Sit to stand with facilitation from PT to maintain foot flat x 10  [facility] Adult PT Treatment:                                                DATE: 12/28/2023 Therapeutic Exercise: Henreitta Leber + hip add iso (ball b/w knees) 2x10 SAQ + assisted DF at end range extension x10 Prone: HS curls (assist for alignment) x10 --> added quad isometric at end range 5x3" Hip IR/ER x10 Hip abd x10 Quad stretch w/strap 3x30" Neuromuscular re-ed: Active assist for ankle DF mechanics/mobility: Seated rocker board --> focus on DF Seated assisted foot articulation --> assisted heel raises with deficit on yoga block Isometric ankle PF (blocking rocker board) Isometric outer heel press into block (glute activation) + ball b/w knees for stabilization    [facility] Adult PT Treatment:                                                DATE: 12/23/23 Therapeutic Exercise: Prone quad stretch with strap 3x30" Prone hamstring curl x10 Prone hip IR/ER x10 Prone hip abd 2x10 Prone quad set 2x10 Gastroc/soleus stretch with strap 3x30" Standing calf stretch 2x30" Manual Therapy: STM & TPR R glute med/max, quads Grade II to III talocrural ankle mobs for DF Grade II to III calcaneal mobs for ankle DF Grade II to III tibia/fibula Pas/APs                                                                                                                               PATIENT EDUCATION:  Education details: HEP update Person educated: Patient Education method: Chief Technology Officer, demo  Education comprehension: verbalized understanding, returned demo  HOME EXERCISE PROGRAM: Access Code: [email or web address] URL: [email or web address] Date: 01/04/2024 Prepared by: Letitia Libra  Exercises - Supine Quad Set  - 1 x daily - 7 x weekly - 2 sets - 10 reps - 5 sec hold - Seated Knee Flexion AAROM  - 1 x daily - 7 x weekly -  1 sets - 10 reps - 5 sec  hold - Sit to Stand with Armchair  - 1 x daily - 7 x weekly - 2 sets - 10 reps - Standing Hip Abduction with Counter Support  - 1 x daily - 7 x weekly - 3 sets - 10 reps - Quadruped Fire Hydrant  - 1 x daily - 7 x weekly - 3 sets - 10 reps - Quadruped Alternating Leg Extensions  - 1 x daily - 7 x weekly - 3 sets - 10 reps - Quadruped Rock Back (BKA)  - 1 x daily - 7 x weekly - 1 sets - 10 reps - 5-10 sec hold - Long Sitting Isometric Hip Abduction with Ball at [employer]  -  1 x daily - 7 x weekly - 3 sets - 10 reps - Gastroc Stretch on Wall  - 1 x daily - 7 x weekly - 2 sets - 30 sec hold - Seated Long Arc Quad  - 1 x daily - 7 x weekly - 2 sets - 10 reps - Seated Toe Raise  - 1 x daily - 7 x weekly - 2 sets - 10 reps  ASSESSMENT:  CLINICAL IMPRESSION: Focused on improving ankle DF to improve mechanics with CKC flexion activity. She is able to complete lateral and forward step down from small range with BUE support with good quad control, though occasional LOB. Worked on improving weight-bearing and mechanics of the RLE with squatting activity with patient requiring UE support to improve control with lowering during staggered sit to stand. No reports of knee pain throughout session.   From eval: Patient is a 48 y.o. female who was seen today for physical therapy evaluation and treatment for s/p Rt knee arthroscopy, debridement of osteochondral lesion and partial lateral meniscectomy on 09/13/23. She also has a history of MS reporting balance, strength and coordination deficits on the RLE. Upon assessment she is noted to have limited Rt knee ROM, RLE weakness, difficulty with transfers and stair negotiation, and gait abnormalities. She will benefit from skilled PT to address the above stated deficits in order to optimize her function and assist with pain reduction.   OBJECTIVE IMPAIRMENTS: Abnormal gait, decreased activity tolerance, decreased balance, decreased coordination,  difficulty walking, decreased ROM, decreased strength, hypomobility, impaired sensation, improper body mechanics, postural dysfunction, and pain.   GOALS: Goals reviewed with patient? Yes  SHORT TERM GOALS: Target date: 12/01/2023  Patient will be independent and compliant with initial HEP.  Baseline: issued at eval  Goal status: MET  2.  Patient will perform sit to stand without UE support to improve ease of transfers.  Baseline: see above  11/23/23: requires use of UE support due to pain/weakness  01/04/24: no use of UE support  Goal status: MET   3.  Patient will demonstrate at least 100 degrees of Rt knee flexion AROM to improve gait mechanics.  Baseline: see above  Goal status: MET    LONG TERM GOALS: Target date: 02/19/24  Patient will be able to descend stairs with reciprocal pattern with handrail support.  Baseline: step to  01/04/24: step to descent  Goal status: ongoing   2.  Patient will demonstrate full Rt knee extension to improve gait mechanics.  Baseline: see above  Goal status: progressing   3.  Patient will return to dance activity without limitation as it relates to her knee.  Baseline: currently using brace and avoiding twisting motions.  01/04/24: attempted previous dance activity, but this aggravated the knee  Goal status: progressing  4.  Patient will be independent with advanced home program to progress/maintain current level of function.  Baseline: initial HEP issued  Goal status: progressing   PLAN:  PT FREQUENCY: 2x/week  PT DURATION: 6 weeks  PLANNED INTERVENTIONS: 97164- PT Re-evaluation, 97110-Therapeutic exercises, 97530- Therapeutic activity, 97112- Neuromuscular re-education, 97535- Self Care, 47829- Manual therapy, [id number]- Gait training, 97014- Electrical stimulation (unattended), [id number]- Electrical stimulation (manual), 97016- Vasopneumatic device, Dry Needling, Joint mobilization, and Cryotherapy  PLAN FOR NEXT SESSION: Mobilize and stretch  Rt ankle! Work on knee ROM, sit to stand transfers; Youth worker. Gait training. Stair negotiation - reciprocal step down, activities to allow for full foot contact with closed chain tasks.   Letitia Libra,  PT, DPT, ATC 01/11/24 10:58 AM

## 2024-01-12 ENCOUNTER — Other Ambulatory Visit: Payer: Self-pay | Admitting: Neurology

## 2024-01-13 ENCOUNTER — Ambulatory Visit: Payer: Federal, State, Local not specified - PPO

## 2024-01-13 DIAGNOSIS — M25561 Pain in right knee: Secondary | ICD-10-CM

## 2024-01-13 DIAGNOSIS — R2689 Other abnormalities of gait and mobility: Secondary | ICD-10-CM

## 2024-01-13 DIAGNOSIS — R2681 Unsteadiness on feet: Secondary | ICD-10-CM

## 2024-01-13 DIAGNOSIS — M6281 Muscle weakness (generalized): Secondary | ICD-10-CM

## 2024-01-13 NOTE — Therapy (Signed)
 OUTPATIENT PHYSICAL THERAPY LOWER EXTREMITY TREATMENT   Patient Name: Calyse Murcia MRN: [id number] DOB:03-05-1976, 48 y.o., female Today's Date: 01/13/2024  END OF SESSION:  PT End of Session - 01/13/24 1107     Visit Number 19    Number of Visits 29    Date for PT Re-Evaluation 02/19/24    Authorization Type BCBS    PT Start Time 1107    PT Stop Time 1148    PT Time Calculation (min) 41 min    Activity Tolerance Patient tolerated treatment well    Behavior During Therapy WFL for tasks assessed/performed            Past Medical History:  Diagnosis Date   Diabetes (HCC)    MS (multiple sclerosis) (HCC)    Past Surgical History:  Procedure Laterality Date   ABDOMINAL HYSTERECTOMY  2016   Patient Active Problem List   Diagnosis Date Noted   Lateral meniscal tear 09/10/2023   Pain of knee joint with osteochondral injury 09/10/2023   Transverse myelitis (HCC) 02/25/2022   Multiple sclerosis (HCC) 02/25/2022   Right foot drop 02/25/2022   High risk medication use 02/25/2022   Allodynia 02/25/2022    PCP: Harvie Heck, MD  REFERRING PROVIDER: Eldred Manges, MD   REFERRING DIAG: [phone] (ICD-10-CM) - Other tear of lateral meniscus of right knee, unspecified whether old or current tear, sequela   THERAPY DIAG:  Acute pain of right knee  Muscle weakness (generalized)  Other abnormalities of gait and mobility  Unsteadiness on feet  Rationale for Evaluation and Treatment: Rehabilitation  ONSET DATE: 09/13/23  SUBJECTIVE:   SUBJECTIVE STATEMENT: Patient reports the knee is feeling better, states ankle continues to feel tight.  EVAL: Patient underwent knee surgery in November and has been doing exercises at home, but needs more help in how to progress her ROM and strength. The pain is better since the surgery. Still has pain in the knee if she twists and when she goes down the stairs. Patient was diagnosed with MS in 2013 and feels her balance, coordination,  and strength is effected on the Rt side. She does have spasticity in the calf. She also has neuropathy in both feet.   PERTINENT HISTORY: MS PAIN:  Are you having pain? Yes: NPRS scale: none currently; 1-2 at worst/10 Pain location: Rt anterior knee Pain description: tender Aggravating factors: transitioning to standing after prolonged sitting   Relieving factors: straightening the knee   PRECAUTIONS: Fall  WEIGHT BEARING RESTRICTIONS: No  FALLS:  Has patient fallen in last 6 months? Yes. Number of falls 2; tripped over grate and fell, LOB and fell  LIVING ENVIRONMENT: Lives with: lives with their daughter Lives in: House/apartment Stairs: Yes: Internal: 2 x 7 steps; on left going up and External: 7 steps; can reach both Has following equipment at home: Single point cane, Quad cane small base, and Walker - 2 wheeled  OCCUPATION: work from home   PATIENT GOALS: "get the full ROM, guidance in exercise."   OBJECTIVE:  Note: Objective measures were completed at Evaluation unless otherwise noted.  DIAGNOSTIC FINDINGS:  Rt knee MRI: IMPRESSION: 1. Small radial tear of the lateral meniscus midbody. 2. 1.7 cm unstable osteochondral lesion of the peripheral lateral femoral condyle. 3. 4.1 cm synovial mass in the lateral joint space adjacent to the patella, possibly focal nodular synovitis. 4. 2.3 cm multiloculated cyst along the medial joint line could represent a parameniscal cyst from occult meniscal tear or ganglion cyst. 5.  Mild tricompartmental osteoarthritis.  COGNITION: Overall cognitive status: Within functional limits for tasks assessed     SENSATION: Not tested; patient endorses bilateral neuropathy   EDEMA:  Unable to inspect knee due to clothing  POSTURE: weight shifts Lt, maintains RLE abducted with foot ER.   PALPATION: TTP lateral aspect of Rt knee   LOWER EXTREMITY ROM: Active ROM Right eval Left eval Right 11/12/23 11/23/23 Right  01/04/24 Right    Hip flexion       Hip extension       Hip abduction       Hip adduction       Hip internal rotation       Hip external rotation       Knee flexion 85 in sitting  92 in sitting 96 in sitting  100 in sitting   Knee extension Lacking 4    Lacking 2  Ankle dorsiflexion       Ankle plantarflexion       Ankle inversion       Ankle eversion        (Blank rows = not tested)  LOWER EXTREMITY MMT: MMT Right eval Left eval 01/04/24 Right   Hip flexion 3- 5 3+  Hip extension     Hip abduction     Hip adduction     Hip internal rotation     Hip external rotation     Knee flexion 4- 5 4+  Knee extension 4- 5 4+  Ankle dorsiflexion 3- 5 3-  Ankle plantarflexion 3+ in sitting 5 3+ in sitting   Ankle inversion 3- 5 3+  Ankle eversion 3- 5 3-   (Blank rows = not tested)  FUNCTIONAL TESTS:  Sit to stand: maintains Rt knee in extension; significant use of BUE  Stair negotiation: reciprocal stair negotiation with ascent; step to descent   GAIT: Distance walked: 20 ft  Assistive device utilized: None Level of assistance: Complete Independence Comments: circumduction RLE, decreased knee flexion RLE, Rt foot ER, lateral trunk lean   [facility] Adult PT Treatment:                                                DATE: 01/13/2024 Manual Therapy: Grade III-IV talocrural ankle mobs for DF Grade II to III tibia/fibula A/P R ankle distraction into DF Ankle DF MWM Neuromuscular re-ed: Prone quad set + assist for ankle DF Seated ankle DF AAROM with stretch at end range with assist from yoga block and strap STS + black TB at lateral ankle for proprioceptive feedback and ankle stability Standing lateral weight shifting on R LE + GTB at lateral ankle for proprioceptive feedback and ankle stability   [facility] Adult PT Treatment:                                                DATE: 01/11/24 Manual Therapy: Demo and returned demo for MWM to improve DF, with handout provided to include as HEP  Grade III-IV  talocrural ankle mobs for DF Grade II to III tibia/fibula Pas/Aps Rt foot PROM with gentle distraction   Therapeutic Activity: Step down lateral 4 inch 2 x 10  Step down forward 2 inch 2 x 10  Staggered  sit to stand with overpressure from PT for foot contact 2 x 10    [facility] Adult PT Treatment:                                                DATE: 01/04/24 Therapeutic Exercise: Seated toe raises x 10  LAQ x 10 Updated HEP  Manual Therapy: Grade III-IV talocrural ankle mobs for DF Grade III calcaneal mobs for ankle DF Grade II to III tibia/fibula Pas/Aps Rt foot PROM with gentle distraction   Therapeutic Activity: Re-assessment to determine overall progress, educating patient on progress towards goals.  Sit to stand with facilitation from PT to maintain foot flat x 10    [facility] Adult PT Treatment:                                                DATE: 12/28/2023 Therapeutic Exercise: Henreitta Leber + hip add iso (ball b/w knees) 2x10 SAQ + assisted DF at end range extension x10 Prone: HS curls (assist for alignment) x10 --> added quad isometric at end range 5x3" Hip IR/ER x10 Hip abd x10 Quad stretch w/strap 3x30" Neuromuscular re-ed: Active assist for ankle DF mechanics/mobility: Seated rocker board --> focus on DF Seated assisted foot articulation --> assisted heel raises with deficit on yoga block Isometric ankle PF (blocking rocker board) Isometric outer heel press into block (glute activation) + ball b/w knees for stabilization    [facility] Adult PT Treatment:                                                DATE: 12/23/23 Therapeutic Exercise: Prone quad stretch with strap 3x30" Prone hamstring curl x10 Prone hip IR/ER x10 Prone hip abd 2x10 Prone quad set 2x10 Gastroc/soleus stretch with strap 3x30" Standing calf stretch 2x30" Manual Therapy: STM & TPR R glute med/max, quads Grade II to III talocrural ankle mobs for DF Grade II to III calcaneal mobs for ankle DF Grade II to III  tibia/fibula Pas/APs                                                                                                                               PATIENT EDUCATION:  Education details: HEP update Person educated: Patient Education method: Chief Technology Officer, demo  Education comprehension: verbalized understanding, returned demo  HOME EXERCISE PROGRAM: Access Code: [email or web address] URL: [email or web address] Date: 01/04/2024 Prepared by: Letitia Libra  Exercises - Supine Quad Set  - 1 x daily - 7 x weekly - 2 sets - 10 reps - 5 sec  hold - Seated Knee Flexion AAROM  - 1 x daily - 7 x weekly - 1 sets - 10 reps - 5 sec  hold - Sit to Stand with Armchair  - 1 x daily - 7 x weekly - 2 sets - 10 reps - Standing Hip Abduction with Counter Support  - 1 x daily - 7 x weekly - 3 sets - 10 reps - Quadruped Fire Hydrant  - 1 x daily - 7 x weekly - 3 sets - 10 reps - Quadruped Alternating Leg Extensions  - 1 x daily - 7 x weekly - 3 sets - 10 reps - Quadruped Rock Back (BKA)  - 1 x daily - 7 x weekly - 1 sets - 10 reps - 5-10 sec hold - Long Sitting Isometric Hip Abduction with Ball at [employer]  - 1 x daily - 7 x weekly - 3 sets - 10 reps - Gastroc Stretch on Wall  - 1 x daily - 7 x weekly - 2 sets - 30 sec hold - Seated Long Arc Quad  - 1 x daily - 7 x weekly - 2 sets - 10 reps - Seated Toe Raise  - 1 x daily - 7 x weekly - 2 sets - 10 reps  ASSESSMENT:  CLINICAL IMPRESSION: Resistance band utilized at lateral ankle to promote proprioceptive awareness and decrease ankle pronation position during sit to stands. Increased glute activation reported with lateral weight shifting on R LE with feedback from resistance band placed on lateral ankle.   From eval: Patient is a 48 y.o. female who was seen today for physical therapy evaluation and treatment for s/p Rt knee arthroscopy, debridement of osteochondral lesion and partial lateral meniscectomy on 09/13/23. She also has a history of MS  reporting balance, strength and coordination deficits on the RLE. Upon assessment she is noted to have limited Rt knee ROM, RLE weakness, difficulty with transfers and stair negotiation, and gait abnormalities. She will benefit from skilled PT to address the above stated deficits in order to optimize her function and assist with pain reduction.   OBJECTIVE IMPAIRMENTS: Abnormal gait, decreased activity tolerance, decreased balance, decreased coordination, difficulty walking, decreased ROM, decreased strength, hypomobility, impaired sensation, improper body mechanics, postural dysfunction, and pain.   GOALS: Goals reviewed with patient? Yes  SHORT TERM GOALS: Target date: 12/01/2023  Patient will be independent and compliant with initial HEP.  Baseline: issued at eval  Goal status: MET  2.  Patient will perform sit to stand without UE support to improve ease of transfers.  Baseline: see above  11/23/23: requires use of UE support due to pain/weakness  01/04/24: no use of UE support  Goal status: MET   3.  Patient will demonstrate at least 100 degrees of Rt knee flexion AROM to improve gait mechanics.  Baseline: see above  Goal status: MET    LONG TERM GOALS: Target date: 02/19/24  Patient will be able to descend stairs with reciprocal pattern with handrail support.  Baseline: step to  01/04/24: step to descent  Goal status: ongoing   2.  Patient will demonstrate full Rt knee extension to improve gait mechanics.  Baseline: see above  Goal status: progressing   3.  Patient will return to dance activity without limitation as it relates to her knee.  Baseline: currently using brace and avoiding twisting motions.  01/04/24: attempted previous dance activity, but this aggravated the knee  Goal status: progressing  4.  Patient will be independent with  advanced home program to progress/maintain current level of function.  Baseline: initial HEP issued  Goal status: progressing    PLAN:  PT FREQUENCY: 2x/week  PT DURATION: 6 weeks  PLANNED INTERVENTIONS: 97164- PT Re-evaluation, 97110-Therapeutic exercises, 97530- Therapeutic activity, 97112- Neuromuscular re-education, 97535- Self Care, 16109- Manual therapy, [id number]- Gait training, 97014- Electrical stimulation (unattended), [id number]- Electrical stimulation (manual), 97016- Vasopneumatic device, Dry Needling, Joint mobilization, and Cryotherapy  PLAN FOR NEXT SESSION: Mobilize and stretch Rt ankle! Work on knee ROM, sit to stand transfers; Youth worker. Gait training. Stair negotiation - reciprocal step down, activities to allow for full foot contact with closed chain tasks.   Carlynn Herald, PTA 01/13/24 12:39 PM

## 2024-01-18 ENCOUNTER — Ambulatory Visit: Payer: Federal, State, Local not specified - PPO

## 2024-01-18 DIAGNOSIS — M25561 Pain in right knee: Secondary | ICD-10-CM | POA: Diagnosis not present

## 2024-01-18 DIAGNOSIS — R2689 Other abnormalities of gait and mobility: Secondary | ICD-10-CM

## 2024-01-18 DIAGNOSIS — M6281 Muscle weakness (generalized): Secondary | ICD-10-CM

## 2024-01-18 NOTE — Therapy (Signed)
 OUTPATIENT PHYSICAL THERAPY LOWER EXTREMITY TREATMENT   Patient Name: Karen Oconnor MRN: [id number] DOB:[birth year], 48 y.o., female Today's Date: 01/18/2024  END OF SESSION:  PT End of Session - 01/18/24 1153     Visit Number 20    Number of Visits 29    Date for PT Re-Evaluation 02/19/24    Authorization Type BCBS    PT Start Time 1155    PT Stop Time 1234    PT Time Calculation (min) 39 min    Activity Tolerance Patient tolerated treatment well    Behavior During Therapy WFL for tasks assessed/performed             Past Medical History:  Diagnosis Date   Diabetes (HCC)    MS (multiple sclerosis) (HCC)    Past Surgical History:  Procedure Laterality Date   ABDOMINAL HYSTERECTOMY  2016   Patient Active Problem List   Diagnosis Date Noted   Lateral meniscal tear 09/10/2023   Pain of knee joint with osteochondral injury 09/10/2023   Transverse myelitis (HCC) 02/25/2022   Multiple sclerosis (HCC) 02/25/2022   Right foot drop 02/25/2022   High risk medication use 02/25/2022   Allodynia 02/25/2022    PCP: Harvie Heck, MD  REFERRING PROVIDER: Eldred Manges, MD   REFERRING DIAG: [phone] (ICD-10-CM) - Other tear of lateral meniscus of right knee, unspecified whether old or current tear, sequela   THERAPY DIAG:  Acute pain of right knee  Muscle weakness (generalized)  Other abnormalities of gait and mobility  Rationale for Evaluation and Treatment: Rehabilitation  ONSET DATE: 09/13/23  SUBJECTIVE:   SUBJECTIVE STATEMENT: "Stairs are getting a little bit better."  EVAL: Patient underwent knee surgery in November and has been doing exercises at home, but needs more help in how to progress her ROM and strength. The pain is better since the surgery. Still has pain in the knee if she twists and when she goes down the stairs. Patient was diagnosed with MS in 2013 and feels her balance, coordination, and strength is effected on the Rt side. She does have  spasticity in the calf. She also has neuropathy in both feet.   PERTINENT HISTORY: MS PAIN:  Are you having pain? Yes: NPRS scale: 1/10 Pain location: Rt anterior knee Pain description: tender Aggravating factors: transitioning to standing after prolonged sitting   Relieving factors: straightening the knee   PRECAUTIONS: Fall  WEIGHT BEARING RESTRICTIONS: No  FALLS:  Has patient fallen in last 6 months? Yes. Number of falls 2; tripped over grate and fell, LOB and fell  LIVING ENVIRONMENT: Lives with: lives with their daughter Lives in: House/apartment Stairs: Yes: Internal: 2 x 7 steps; on left going up and External: 7 steps; can reach both Has following equipment at home: Single point cane, Quad cane small base, and Walker - 2 wheeled  OCCUPATION: work from home   PATIENT GOALS: "get the full ROM, guidance in exercise."   OBJECTIVE:  Note: Objective measures were completed at Evaluation unless otherwise noted.  DIAGNOSTIC FINDINGS:  Rt knee MRI: IMPRESSION: 1. Small radial tear of the lateral meniscus midbody. 2. 1.7 cm unstable osteochondral lesion of the peripheral lateral femoral condyle. 3. 4.1 cm synovial mass in the lateral joint space adjacent to the patella, possibly focal nodular synovitis. 4. 2.3 cm multiloculated cyst along the medial joint line could represent a parameniscal cyst from occult meniscal tear or ganglion cyst. 5. Mild tricompartmental osteoarthritis.  COGNITION: Overall cognitive status: Within functional limits for tasks  assessed     SENSATION: Not tested; patient endorses bilateral neuropathy   EDEMA:  Unable to inspect knee due to clothing  POSTURE: weight shifts Lt, maintains RLE abducted with foot ER.   PALPATION: TTP lateral aspect of Rt knee   LOWER EXTREMITY ROM: Active ROM Right eval Left eval Right 11/12/23 11/23/23 Right  01/04/24 Right   Hip flexion       Hip extension       Hip abduction       Hip adduction        Hip internal rotation       Hip external rotation       Knee flexion 85 in sitting  92 in sitting 96 in sitting  100 in sitting   Knee extension Lacking 4    Lacking 2  Ankle dorsiflexion       Ankle plantarflexion       Ankle inversion       Ankle eversion        (Blank rows = not tested)  LOWER EXTREMITY MMT: MMT Right eval Left eval 01/04/24 Right   Hip flexion 3- 5 3+  Hip extension     Hip abduction     Hip adduction     Hip internal rotation     Hip external rotation     Knee flexion 4- 5 4+  Knee extension 4- 5 4+  Ankle dorsiflexion 3- 5 3-  Ankle plantarflexion 3+ in sitting 5 3+ in sitting   Ankle inversion 3- 5 3+  Ankle eversion 3- 5 3-   (Blank rows = not tested)  FUNCTIONAL TESTS:  Sit to stand: maintains Rt knee in extension; significant use of BUE  Stair negotiation: reciprocal stair negotiation with ascent; step to descent   GAIT: Distance walked: 20 ft  Assistive device utilized: None Level of assistance: Complete Independence Comments: circumduction RLE, decreased knee flexion RLE, Rt foot ER, lateral trunk lean  [facility] Adult PT Treatment:                                                DATE: 01/18/24  Neuromuscular re-ed: Tandem 3 trials each  SLS RLE attempted unable  Resisted backward walking x 10; 5 lbs  Staggered stance (kickback) with opposite hand reaching to chair  Therapeutic Activity: sit to stand with overpressure from PT for foot contact x 10 Leg press 2 x 10 @ 65 lbs with blue band at thighs for glute activation  Squat with UE support x 10  Forward step down 4 inch 2 x10    [facility] Adult PT Treatment:                                                DATE: 01/13/2024 Manual Therapy: Grade III-IV talocrural ankle mobs for DF Grade II to III tibia/fibula A/P R ankle distraction into DF Ankle DF MWM Neuromuscular re-ed: Prone quad set + assist for ankle DF Seated ankle DF AAROM with stretch at end range with assist from yoga block and  strap STS + black TB at lateral ankle for proprioceptive feedback and ankle stability Standing lateral weight shifting on R LE + GTB at lateral ankle for proprioceptive feedback and  ankle stability   [facility] Adult PT Treatment:                                                DATE: 01/11/24 Manual Therapy: Demo and returned demo for MWM to improve DF, with handout provided to include as HEP  Grade III-IV talocrural ankle mobs for DF Grade II to III tibia/fibula Pas/Aps Rt foot PROM with gentle distraction   Therapeutic Activity: Step down lateral 4 inch 2 x 10  Step down forward 2 inch 2 x 10  Staggered sit to stand with overpressure from PT for foot contact 2 x 10                                                                                                                                PATIENT EDUCATION:  Education details: HEP update Person educated: Patient Education method: Chief Technology Officer, demo  Education comprehension: verbalized understanding, returned demo  HOME EXERCISE PROGRAM: Access Code: [email or web address] URL: [email or web address] Date: 01/18/2024 Prepared by: Letitia Libra  Exercises - Gastroc Stretch on Wall  - 1 x daily - 7 x weekly - 2 sets - 30 sec hold - Seated Knee Flexion AAROM  - 1 x daily - 7 x weekly - 1 sets - 10 reps - 5 sec  hold - Standing Hip Abduction with Counter Support  - 1 x daily - 7 x weekly - 3 sets - 10 reps - Long Sitting Isometric Hip Abduction with Ball at [employer]  - 1 x daily - 7 x weekly - 3 sets - 10 reps - Seated Long Arc Quad  - 1 x daily - 7 x weekly - 2 sets - 10 reps - Seated Toe Raise  - 1 x daily - 7 x weekly - 2 sets - 10 reps - Forward Step Down  - 1 x daily - 7 x weekly - 2 sets - 10 reps - Mini Squat with Counter Support  - 1 x daily - 7 x weekly - 2 sets - 10 reps - Tandem Stance  - 1 x daily - 7 x weekly - 3 sets - max  hold - Single Leg Stance with Support  - 1 x daily - 7 x weekly - 3 sets - max  hold  ASSESSMENT:  CLINICAL IMPRESSION: Patient able to maintain symmetrical foot placement with sit to stand. With mini squats she has tendency to compensate with Lt lateral shift, but when cued to allow for increased knee flexion, shift is reduced. She is able to perform forward step down from 4 inch step with BUE support with good control and stability. Unable to maintain SLS on the RLE requiring use of UE support to maintain balance and is challenged with tandem stance with RLE in rear. Only report of knee  pain was with sit to stand, otherwise good tolerance to strength and balance progression.   From eval: Patient is a 48 y.o. female who was seen today for physical therapy evaluation and treatment for s/p Rt knee arthroscopy, debridement of osteochondral lesion and partial lateral meniscectomy on 09/13/23. She also has a history of MS reporting balance, strength and coordination deficits on the RLE. Upon assessment she is noted to have limited Rt knee ROM, RLE weakness, difficulty with transfers and stair negotiation, and gait abnormalities. She will benefit from skilled PT to address the above stated deficits in order to optimize her function and assist with pain reduction.   OBJECTIVE IMPAIRMENTS: Abnormal gait, decreased activity tolerance, decreased balance, decreased coordination, difficulty walking, decreased ROM, decreased strength, hypomobility, impaired sensation, improper body mechanics, postural dysfunction, and pain.   GOALS: Goals reviewed with patient? Yes  SHORT TERM GOALS: Target date: 12/01/2023  Patient will be independent and compliant with initial HEP.  Baseline: issued at eval  Goal status: MET  2.  Patient will perform sit to stand without UE support to improve ease of transfers.  Baseline: see above  11/23/23: requires use of UE support due to pain/weakness  01/04/24: no use of UE support  Goal status: MET   3.  Patient will demonstrate at least 100 degrees of Rt  knee flexion AROM to improve gait mechanics.  Baseline: see above  Goal status: MET    LONG TERM GOALS: Target date: 02/19/24  Patient will be able to descend stairs with reciprocal pattern with handrail support.  Baseline: step to  01/04/24: step to descent  Goal status: ongoing   2.  Patient will demonstrate full Rt knee extension to improve gait mechanics.  Baseline: see above  Goal status: progressing   3.  Patient will return to dance activity without limitation as it relates to her knee.  Baseline: currently using brace and avoiding twisting motions.  01/04/24: attempted previous dance activity, but this aggravated the knee  Goal status: progressing  4.  Patient will be independent with advanced home program to progress/maintain current level of function.  Baseline: initial HEP issued  Goal status: progressing   PLAN:  PT FREQUENCY: 2x/week  PT DURATION: 6 weeks  PLANNED INTERVENTIONS: 97164- PT Re-evaluation, 97110-Therapeutic exercises, 97530- Therapeutic activity, 97112- Neuromuscular re-education, 97535- Self Care, 62952- Manual therapy, [id number]- Gait training, 97014- Electrical stimulation (unattended), [id number]- Electrical stimulation (manual), 97016- Vasopneumatic device, Dry Needling, Joint mobilization, and Cryotherapy  PLAN FOR NEXT SESSION: Mobilize and stretch Rt ankle! Work on knee ROM, sit to stand transfers; Youth worker. Gait training. Stair negotiation - reciprocal step down, activities to allow for full foot contact with closed chain tasks.   Letitia Libra, PT, DPT, ATC 01/18/24 12:39 PM

## 2024-01-20 ENCOUNTER — Ambulatory Visit: Payer: Federal, State, Local not specified - PPO

## 2024-01-20 DIAGNOSIS — M25561 Pain in right knee: Secondary | ICD-10-CM | POA: Diagnosis not present

## 2024-01-20 DIAGNOSIS — M6281 Muscle weakness (generalized): Secondary | ICD-10-CM

## 2024-01-20 DIAGNOSIS — R2681 Unsteadiness on feet: Secondary | ICD-10-CM

## 2024-01-20 DIAGNOSIS — R2689 Other abnormalities of gait and mobility: Secondary | ICD-10-CM

## 2024-01-20 NOTE — Therapy (Signed)
 OUTPATIENT PHYSICAL THERAPY LOWER EXTREMITY TREATMENT   Patient Name: Karen Oconnor MRN: [id number] DOB:[birth year], 48 y.o., female Today's Date: 01/20/2024  END OF SESSION:  PT End of Session - 01/20/24 1107     Visit Number 21    Date for PT Re-Evaluation 02/19/24    Authorization Type BCBS    PT Start Time 1107    PT Stop Time 1145    PT Time Calculation (min) 38 min    Behavior During Therapy WFL for tasks assessed/performed             Past Medical History:  Diagnosis Date   Diabetes (HCC)    MS (multiple sclerosis) (HCC)    Past Surgical History:  Procedure Laterality Date   ABDOMINAL HYSTERECTOMY  2016   Patient Active Problem List   Diagnosis Date Noted   Lateral meniscal tear 09/10/2023   Pain of knee joint with osteochondral injury 09/10/2023   Transverse myelitis (HCC) 02/25/2022   Multiple sclerosis (HCC) 02/25/2022   Right foot drop 02/25/2022   High risk medication use 02/25/2022   Allodynia 02/25/2022    PCP: Harvie Heck, MD  REFERRING PROVIDER: Eldred Manges, MD   REFERRING DIAG: [phone] (ICD-10-CM) - Other tear of lateral meniscus of right knee, unspecified whether old or current tear, sequela   THERAPY DIAG:  Acute pain of right knee  Muscle weakness (generalized)  Other abnormalities of gait and mobility  Unsteadiness on feet  Rationale for Evaluation and Treatment: Rehabilitation  ONSET DATE: 09/13/23  SUBJECTIVE:   SUBJECTIVE STATEMENT: Patient reports knee continues to feel better and has minimal pain in lower leg since fall.   EVAL: Patient underwent knee surgery in November and has been doing exercises at home, but needs more help in how to progress her ROM and strength. The pain is better since the surgery. Still has pain in the knee if she twists and when she goes down the stairs. Patient was diagnosed with MS in 2013 and feels her balance, coordination, and strength is effected on the Rt side. She does have spasticity  in the calf. She also has neuropathy in both feet.   PERTINENT HISTORY: MS PAIN:  Are you having pain? Yes: NPRS scale: 1/10 Pain location: Rt anterior knee Pain description: tender Aggravating factors: transitioning to standing after prolonged sitting   Relieving factors: straightening the knee   PRECAUTIONS: Fall  WEIGHT BEARING RESTRICTIONS: No  FALLS:  Has patient fallen in last 6 months? Yes. Number of falls 2; tripped over grate and fell, LOB and fell  LIVING ENVIRONMENT: Lives with: lives with their daughter Lives in: House/apartment Stairs: Yes: Internal: 2 x 7 steps; on left going up and External: 7 steps; can reach both Has following equipment at home: Single point cane, Quad cane small base, and Walker - 2 wheeled  OCCUPATION: work from home   PATIENT GOALS: "get the full ROM, guidance in exercise."   OBJECTIVE:  Note: Objective measures were completed at Evaluation unless otherwise noted.  DIAGNOSTIC FINDINGS:  Rt knee MRI: IMPRESSION: 1. Small radial tear of the lateral meniscus midbody. 2. 1.7 cm unstable osteochondral lesion of the peripheral lateral femoral condyle. 3. 4.1 cm synovial mass in the lateral joint space adjacent to the patella, possibly focal nodular synovitis. 4. 2.3 cm multiloculated cyst along the medial joint line could represent a parameniscal cyst from occult meniscal tear or ganglion cyst. 5. Mild tricompartmental osteoarthritis.  COGNITION: Overall cognitive status: Within functional limits for tasks assessed  SENSATION: Not tested; patient endorses bilateral neuropathy   EDEMA:  Unable to inspect knee due to clothing  POSTURE: weight shifts Lt, maintains RLE abducted with foot ER.   PALPATION: TTP lateral aspect of Rt knee   LOWER EXTREMITY ROM: Active ROM Right eval Left eval Right 11/12/23 11/23/23 Right  01/04/24 Right   Hip flexion       Hip extension       Hip abduction       Hip adduction       Hip  internal rotation       Hip external rotation       Knee flexion 85 in sitting  92 in sitting 96 in sitting  100 in sitting   Knee extension Lacking 4    Lacking 2  Ankle dorsiflexion       Ankle plantarflexion       Ankle inversion       Ankle eversion        (Blank rows = not tested)  LOWER EXTREMITY MMT: MMT Right eval Left eval 01/04/24 Right   Hip flexion 3- 5 3+  Hip extension     Hip abduction     Hip adduction     Hip internal rotation     Hip external rotation     Knee flexion 4- 5 4+  Knee extension 4- 5 4+  Ankle dorsiflexion 3- 5 3-  Ankle plantarflexion 3+ in sitting 5 3+ in sitting   Ankle inversion 3- 5 3+  Ankle eversion 3- 5 3-   (Blank rows = not tested)  FUNCTIONAL TESTS:  Sit to stand: maintains Rt knee in extension; significant use of BUE  Stair negotiation: reciprocal stair negotiation with ascent; step to descent   GAIT: Distance walked: 20 ft  Assistive device utilized: None Level of assistance: Complete Independence Comments: circumduction RLE, decreased knee flexion RLE, Rt foot ER, lateral trunk lean   [facility] Adult PT Treatment:                                                DATE: 01/20/2024 Therapeutic Exercise: Lateral ankle stretch on rocker board Standing gastroc stretch with forefoot on low mat roll --> shifting forward Squats Step up/down 4" --> 6" steps Neuromuscular re-ed: SAQ (green bolster) + 2#AW 2x10 AAROM small range SLR 2x10 (stretch strap for foot alignment) Side Lying AAROM hip abduction in ER (stretch strap for foot alignment) Lateral ankle mobility + YTB at lateral malleolus   [facility] Adult PT Treatment:                                                DATE: 01/18/24 Neuromuscular re-ed: Tandem 3 trials each  SLS RLE attempted unable  Resisted backward walking x 10; 5 lbs  Staggered stance (kickback) with opposite hand reaching to chair  Therapeutic Activity: sit to stand with overpressure from PT for foot contact x  10 Leg press 2 x 10 @ 65 lbs with blue band at thighs for glute activation  Squat with UE support x 10  Forward step down 4 inch 2 x10    [facility] Adult PT Treatment:  DATE: 01/13/2024 Manual Therapy: Grade III-IV talocrural ankle mobs for DF Grade II to III tibia/fibula A/P R ankle distraction into DF Ankle DF MWM Neuromuscular re-ed: Prone quad set + assist for ankle DF Seated ankle DF AAROM with stretch at end range with assist from yoga block and strap STS + black TB at lateral ankle for proprioceptive feedback and ankle stability Standing lateral weight shifting on R LE + GTB at lateral ankle for proprioceptive feedback and ankle stability   [facility] Adult PT Treatment:                                                DATE: 01/11/24 Manual Therapy: Demo and returned demo for MWM to improve DF, with handout provided to include as HEP  Grade III-IV talocrural ankle mobs for DF Grade II to III tibia/fibula Pas/Aps Rt foot PROM with gentle distraction   Therapeutic Activity: Step down lateral 4 inch 2 x 10  Step down forward 2 inch 2 x 10  Staggered sit to stand with overpressure from PT for foot contact 2 x 10                                                                                                                                PATIENT EDUCATION:  Education details: HEP update Person educated: Patient Education method: Chief Technology Officer, demo  Education comprehension: verbalized understanding, returned demo  HOME EXERCISE PROGRAM: Access Code: [email or web address] URL: [email or web address] Date: 01/18/2024 Prepared by: Letitia Libra  Exercises - Gastroc Stretch on Wall  - 1 x daily - 7 x weekly - 2 sets - 30 sec hold - Seated Knee Flexion AAROM  - 1 x daily - 7 x weekly - 1 sets - 10 reps - 5 sec  hold - Standing Hip Abduction with Counter Support  - 1 x daily - 7 x weekly - 3 sets - 10 reps - Long Sitting Isometric  Hip Abduction with Ball at [employer]  - 1 x daily - 7 x weekly - 3 sets - 10 reps - Seated Long Arc Quad  - 1 x daily - 7 x weekly - 2 sets - 10 reps - Seated Toe Raise  - 1 x daily - 7 x weekly - 2 sets - 10 reps - Forward Step Down  - 1 x daily - 7 x weekly - 2 sets - 10 reps - Mini Squat with Counter Support  - 1 x daily - 7 x weekly - 2 sets - 10 reps - Tandem Stance  - 1 x daily - 7 x weekly - 3 sets - max  hold - Single Leg Stance with Support  - 1 x daily - 7 x weekly - 3 sets - max hold  ASSESSMENT:  CLINICAL IMPRESSION: Lateral ankle mobility incorporated,  adding light resistance for proprioceptive awareness. Cueing improved weight bearing on R LE and postural awareness during standing squats. Patient able to ascend/descend stairs with no exacerbation of knee pain.   From eval: Patient is a 48 y.o. female who was seen today for physical therapy evaluation and treatment for s/p Rt knee arthroscopy, debridement of osteochondral lesion and partial lateral meniscectomy on 09/13/23. She also has a history of MS reporting balance, strength and coordination deficits on the RLE. Upon assessment she is noted to have limited Rt knee ROM, RLE weakness, difficulty with transfers and stair negotiation, and gait abnormalities. She will benefit from skilled PT to address the above stated deficits in order to optimize her function and assist with pain reduction.   OBJECTIVE IMPAIRMENTS: Abnormal gait, decreased activity tolerance, decreased balance, decreased coordination, difficulty walking, decreased ROM, decreased strength, hypomobility, impaired sensation, improper body mechanics, postural dysfunction, and pain.   GOALS: Goals reviewed with patient? Yes  SHORT TERM GOALS: Target date: 12/01/2023  Patient will be independent and compliant with initial HEP.  Baseline: issued at eval  Goal status: MET  2.  Patient will perform sit to stand without UE support to improve ease of transfers.  Baseline: see  above  11/23/23: requires use of UE support due to pain/weakness  01/04/24: no use of UE support  Goal status: MET   3.  Patient will demonstrate at least 100 degrees of Rt knee flexion AROM to improve gait mechanics.  Baseline: see above  Goal status: MET    LONG TERM GOALS: Target date: 02/19/24  Patient will be able to descend stairs with reciprocal pattern with handrail support.  Baseline: step to  01/04/24: step to descent  Goal status: ongoing   2.  Patient will demonstrate full Rt knee extension to improve gait mechanics.  Baseline: see above  Goal status: progressing   3.  Patient will return to dance activity without limitation as it relates to her knee.  Baseline: currently using brace and avoiding twisting motions.  01/04/24: attempted previous dance activity, but this aggravated the knee  Goal status: progressing  4.  Patient will be independent with advanced home program to progress/maintain current level of function.  Baseline: initial HEP issued  Goal status: progressing   PLAN:  PT FREQUENCY: 2x/week  PT DURATION: 6 weeks  PLANNED INTERVENTIONS: 97164- PT Re-evaluation, 97110-Therapeutic exercises, 97530- Therapeutic activity, 97112- Neuromuscular re-education, 97535- Self Care, 19147- Manual therapy, [id number]- Gait training, 97014- Electrical stimulation (unattended), [id number]- Electrical stimulation (manual), 97016- Vasopneumatic device, Dry Needling, Joint mobilization, and Cryotherapy  PLAN FOR NEXT SESSION: Work on knee & ankle ROM/strength, sit to stand transfers; quad/hamstring strengthening. Stair negotiation - reciprocal step down, activities to allow for full foot contact with closed chain tasks.   Carlynn Herald, PTA 01/20/24 12:16 PM

## 2024-01-25 ENCOUNTER — Ambulatory Visit: Attending: Orthopaedic Surgery

## 2024-01-25 DIAGNOSIS — R2681 Unsteadiness on feet: Secondary | ICD-10-CM | POA: Insufficient documentation

## 2024-01-25 DIAGNOSIS — R2689 Other abnormalities of gait and mobility: Secondary | ICD-10-CM | POA: Insufficient documentation

## 2024-01-25 DIAGNOSIS — M25561 Pain in right knee: Secondary | ICD-10-CM | POA: Diagnosis present

## 2024-01-25 DIAGNOSIS — M6281 Muscle weakness (generalized): Secondary | ICD-10-CM | POA: Diagnosis present

## 2024-01-25 NOTE — Therapy (Signed)
 OUTPATIENT PHYSICAL THERAPY LOWER EXTREMITY TREATMENT   Patient Name: Karen Oconnor MRN: [id number] DOB:11-07-1975, 48 y.o., female Today's Date: 01/25/2024  END OF SESSION:  PT End of Session - 01/25/24 1017     Visit Number 22    Number of Visits 29    Date for PT Re-Evaluation 02/19/24    Authorization Type BCBS    PT Start Time 1017    PT Stop Time 1059    PT Time Calculation (min) 42 min    Activity Tolerance Patient tolerated treatment well    Behavior During Therapy WFL for tasks assessed/performed              Past Medical History:  Diagnosis Date   Diabetes (HCC)    MS (multiple sclerosis) (HCC)    Past Surgical History:  Procedure Laterality Date   ABDOMINAL HYSTERECTOMY  2016   Patient Active Problem List   Diagnosis Date Noted   Lateral meniscal tear 09/10/2023   Pain of knee joint with osteochondral injury 09/10/2023   Transverse myelitis (HCC) 02/25/2022   Multiple sclerosis (HCC) 02/25/2022   Right foot drop 02/25/2022   High risk medication use 02/25/2022   Allodynia 02/25/2022    PCP: Harvie Heck, MD  REFERRING PROVIDER: Eldred Manges, MD   REFERRING DIAG: [phone] (ICD-10-CM) - Other tear of lateral meniscus of right knee, unspecified whether old or current tear, sequela   THERAPY DIAG:  Acute pain of right knee  Muscle weakness (generalized)  Other abnormalities of gait and mobility  Rationale for Evaluation and Treatment: Rehabilitation  ONSET DATE: 09/13/23  SUBJECTIVE:   SUBJECTIVE STATEMENT: Patient reports no pain in the knee right now. Went up and down stairs this weekend and was occasionally able to complete using reciprocal pattern. She feels that she is nearing baseline function, but is still lacking strength and balance.   EVAL: Patient underwent knee surgery in November and has been doing exercises at home, but needs more help in how to progress her ROM and strength. The pain is better since the surgery. Still has  pain in the knee if she twists and when she goes down the stairs. Patient was diagnosed with MS in 2013 and feels her balance, coordination, and strength is effected on the Rt side. She does have spasticity in the calf. She also has neuropathy in both feet.   PERTINENT HISTORY: MS PAIN:  Are you having pain? Yes: NPRS scale: none currently; at worst 3 /10 Pain location: Rt anterior knee Pain description: tender Aggravating factors: transitioning to standing after prolonged sitting   Relieving factors: straightening the knee   PRECAUTIONS: Fall  WEIGHT BEARING RESTRICTIONS: No  FALLS:  Has patient fallen in last 6 months? Yes. Number of falls 2; tripped over grate and fell, LOB and fell  LIVING ENVIRONMENT: Lives with: lives with their daughter Lives in: House/apartment Stairs: Yes: Internal: 2 x 7 steps; on left going up and External: 7 steps; can reach both Has following equipment at home: Single point cane, Quad cane small base, and Walker - 2 wheeled  OCCUPATION: work from home   PATIENT GOALS: "get the full ROM, guidance in exercise."   OBJECTIVE:  Note: Objective measures were completed at Evaluation unless otherwise noted.  DIAGNOSTIC FINDINGS:  Rt knee MRI: IMPRESSION: 1. Small radial tear of the lateral meniscus midbody. 2. 1.7 cm unstable osteochondral lesion of the peripheral lateral femoral condyle. 3. 4.1 cm synovial mass in the lateral joint space adjacent to the patella,  possibly focal nodular synovitis. 4. 2.3 cm multiloculated cyst along the medial joint line could represent a parameniscal cyst from occult meniscal tear or ganglion cyst. 5. Mild tricompartmental osteoarthritis.  COGNITION: Overall cognitive status: Within functional limits for tasks assessed     SENSATION: Not tested; patient endorses bilateral neuropathy   EDEMA:  Unable to inspect knee due to clothing  POSTURE: weight shifts Lt, maintains RLE abducted with foot ER.    PALPATION: TTP lateral aspect of Rt knee   LOWER EXTREMITY ROM: Active ROM Right eval Left eval Right 11/12/23 11/23/23 Right  01/04/24 Right   Hip flexion       Hip extension       Hip abduction       Hip adduction       Hip internal rotation       Hip external rotation       Knee flexion 85 in sitting  92 in sitting 96 in sitting  100 in sitting   Knee extension Lacking 4    Lacking 2  Ankle dorsiflexion       Ankle plantarflexion       Ankle inversion       Ankle eversion        (Blank rows = not tested)  LOWER EXTREMITY MMT: MMT Right eval Left eval 01/04/24 Right   Hip flexion 3- 5 3+  Hip extension     Hip abduction     Hip adduction     Hip internal rotation     Hip external rotation     Knee flexion 4- 5 4+  Knee extension 4- 5 4+  Ankle dorsiflexion 3- 5 3-  Ankle plantarflexion 3+ in sitting 5 3+ in sitting   Ankle inversion 3- 5 3+  Ankle eversion 3- 5 3-   (Blank rows = not tested)  FUNCTIONAL TESTS:  Sit to stand: maintains Rt knee in extension; significant use of BUE  Stair negotiation: reciprocal stair negotiation with ascent; step to descent   GAIT: Distance walked: 20 ft  Assistive device utilized: None Level of assistance: Complete Independence Comments: circumduction RLE, decreased knee flexion RLE, Rt foot ER, lateral trunk lean  [facility] Adult PT Treatment:                                                DATE: 01/25/24  Neuromuscular re-ed: Semi-tandem 3 x 30 sec each  Romberg x 30 sec Romberg eyes closed 5 trials 3-30 seconds  Tandem multiple trials 5-20 seconds  Romberg with vertical and horizontal head turns x 10 each  Romberg on airex 3 x 30 sec  SLS 3 trials each: LLE 4 seconds; RLE 1 second SLS RLE with LLE on orange ball and finger tip 3 trials, 5 seconds  Therapeutic Activity: Sit to stand 2 x 5    [facility] Adult PT Treatment:                                                DATE: 01/20/2024 Therapeutic Exercise: Lateral ankle  stretch on rocker board Standing gastroc stretch with forefoot on low mat roll --> shifting forward Squats Step up/down 4" --> 6" steps Neuromuscular re-ed: SAQ (green bolster) + 2#AW  2x10 AAROM small range SLR 2x10 (stretch strap for foot alignment) Side Lying AAROM hip abduction in ER (stretch strap for foot alignment) Lateral ankle mobility + YTB at lateral malleolus   [facility] Adult PT Treatment:                                                DATE: 01/18/24 Neuromuscular re-ed: Tandem 3 trials each  SLS RLE attempted unable  Resisted backward walking x 10; 5 lbs  Staggered stance (kickback) with opposite hand reaching to chair  Therapeutic Activity: sit to stand with overpressure from PT for foot contact x 10 Leg press 2 x 10 @ 65 lbs with blue band at thighs for glute activation  Squat with UE support x 10  Forward step down 4 inch 2 x10    [facility] Adult PT Treatment:                                                DATE: 01/13/2024 Manual Therapy: Grade III-IV talocrural ankle mobs for DF Grade II to III tibia/fibula A/P R ankle distraction into DF Ankle DF MWM Neuromuscular re-ed: Prone quad set + assist for ankle DF Seated ankle DF AAROM with stretch at end range with assist from yoga block and strap STS + black TB at lateral ankle for proprioceptive feedback and ankle stability Standing lateral weight shifting on R LE + GTB at lateral ankle for proprioceptive feedback and ankle stability                                                                                                                             PATIENT EDUCATION:  Education details: HEP review Person educated: Patient Education method: Programmer, multimedia, demo  Education comprehension: verbalized understanding, returned demo  HOME EXERCISE PROGRAM: Access Code: [email or web address] URL: [email or web address] Date: 01/18/2024 Prepared by: Letitia Libra  Exercises - Gastroc Stretch on Wall  - 1 x daily - 7 x  weekly - 2 sets - 30 sec hold - Seated Knee Flexion AAROM  - 1 x daily - 7 x weekly - 1 sets - 10 reps - 5 sec  hold - Standing Hip Abduction with Counter Support  - 1 x daily - 7 x weekly - 3 sets - 10 reps - Long Sitting Isometric Hip Abduction with Ball at [employer]  - 1 x daily - 7 x weekly - 3 sets - 10 reps - Seated Long Arc Quad  - 1 x daily - 7 x weekly - 2 sets - 10 reps - Seated Toe Raise  - 1 x daily - 7 x weekly - 2 sets - 10 reps - Forward Step Down  - 1 x  daily - 7 x weekly - 2 sets - 10 reps - Mini Squat with Counter Support  - 1 x daily - 7 x weekly - 2 sets - 10 reps - Tandem Stance  - 1 x daily - 7 x weekly - 3 sets - max  hold - Single Leg Stance with Support  - 1 x daily - 7 x weekly - 3 sets - max hold  ASSESSMENT:  CLINICAL IMPRESSION: Focused on static balance activity as patient feels she has not returned to her baseline level of stability post-operatively. She has most difficulty maintaining balance with tandem stance with RLE in rear requiring use of reaching strategy occasionally to maintain balance. Able to briefly hold SLS on the RLE for 1 second before requiring reaching strategy/PT assist to regain balance. Frequent cues required to reduce knee hyperextension with balance activity. No reports of knee pain with balance activity, but does endorse fatigue.   From eval: Patient is a 48 y.o. female who was seen today for physical therapy evaluation and treatment for s/p Rt knee arthroscopy, debridement of osteochondral lesion and partial lateral meniscectomy on 09/13/23. She also has a history of MS reporting balance, strength and coordination deficits on the RLE. Upon assessment she is noted to have limited Rt knee ROM, RLE weakness, difficulty with transfers and stair negotiation, and gait abnormalities. She will benefit from skilled PT to address the above stated deficits in order to optimize her function and assist with pain reduction.   OBJECTIVE IMPAIRMENTS: Abnormal  gait, decreased activity tolerance, decreased balance, decreased coordination, difficulty walking, decreased ROM, decreased strength, hypomobility, impaired sensation, improper body mechanics, postural dysfunction, and pain.   GOALS: Goals reviewed with patient? Yes  SHORT TERM GOALS: Target date: 12/01/2023  Patient will be independent and compliant with initial HEP.  Baseline: issued at eval  Goal status: MET  2.  Patient will perform sit to stand without UE support to improve ease of transfers.  Baseline: see above  11/23/23: requires use of UE support due to pain/weakness  01/04/24: no use of UE support  Goal status: MET   3.  Patient will demonstrate at least 100 degrees of Rt knee flexion AROM to improve gait mechanics.  Baseline: see above  Goal status: MET    LONG TERM GOALS: Target date: 02/19/24  Patient will be able to descend stairs with reciprocal pattern with handrail support.  Baseline: step to  01/04/24: step to descent  Goal status: ongoing   2.  Patient will demonstrate full Rt knee extension to improve gait mechanics.  Baseline: see above  Goal status: progressing   3.  Patient will return to dance activity without limitation as it relates to her knee.  Baseline: currently using brace and avoiding twisting motions.  01/04/24: attempted previous dance activity, but this aggravated the knee  Goal status: progressing  4.  Patient will be independent with advanced home program to progress/maintain current level of function.  Baseline: initial HEP issued  Goal status: progressing   PLAN:  PT FREQUENCY: 2x/week  PT DURATION: 6 weeks  PLANNED INTERVENTIONS: 97164- PT Re-evaluation, 97110-Therapeutic exercises, 97530- Therapeutic activity, 97112- Neuromuscular re-education, 97535- Self Care, [id number]- Manual therapy, [id number]- Gait training, 97014- Electrical stimulation (unattended), [id number]- Electrical stimulation (manual), 97016- Vasopneumatic device, Dry Needling,  Joint mobilization, and Cryotherapy  PLAN FOR NEXT SESSION: Work on knee & ankle ROM/strength, sit to stand transfers; quad/hamstring strengthening. Stair negotiation - reciprocal step down, activities to allow for full foot contact with  closed chain tasks. Static balance   Letitia Libra, PT, DPT, ATC 01/25/24 10:59 AM

## 2024-01-27 ENCOUNTER — Ambulatory Visit

## 2024-01-27 DIAGNOSIS — M25561 Pain in right knee: Secondary | ICD-10-CM | POA: Diagnosis not present

## 2024-01-27 DIAGNOSIS — R2689 Other abnormalities of gait and mobility: Secondary | ICD-10-CM

## 2024-01-27 DIAGNOSIS — M6281 Muscle weakness (generalized): Secondary | ICD-10-CM

## 2024-01-27 NOTE — Therapy (Signed)
 OUTPATIENT PHYSICAL THERAPY LOWER EXTREMITY TREATMENT   Patient Name: Karen Oconnor MRN: [id number] DOB:01-01-1976, 48 y.o., female Today's Date: 01/27/2024  END OF SESSION:  PT End of Session - 01/27/24 1541     Visit Number 23    Number of Visits 29    Date for PT Re-Evaluation 02/19/24    Authorization Type BCBS    PT Start Time 1540   late check-in   PT Stop Time 1618    PT Time Calculation (min) 38 min    Activity Tolerance Patient tolerated treatment well    Behavior During Therapy WFL for tasks assessed/performed               Past Medical History:  Diagnosis Date   Diabetes (HCC)    MS (multiple sclerosis) (HCC)    Past Surgical History:  Procedure Laterality Date   ABDOMINAL HYSTERECTOMY  2016   Patient Active Problem List   Diagnosis Date Noted   Lateral meniscal tear 09/10/2023   Pain of knee joint with osteochondral injury 09/10/2023   Transverse myelitis (HCC) 02/25/2022   Multiple sclerosis (HCC) 02/25/2022   Right foot drop 02/25/2022   High risk medication use 02/25/2022   Allodynia 02/25/2022    PCP: Harvie Heck, MD  REFERRING PROVIDER: Eldred Manges, MD   REFERRING DIAG: [phone] (ICD-10-CM) - Other tear of lateral meniscus of right knee, unspecified whether old or current tear, sequela   THERAPY DIAG:  Acute pain of right knee  Muscle weakness (generalized)  Other abnormalities of gait and mobility  Rationale for Evaluation and Treatment: Rehabilitation  ONSET DATE: 09/13/23  SUBJECTIVE:   SUBJECTIVE STATEMENT: Patient reports knee is feeling good. She went to dance class on Tuesday and wore her brace and the knee was fine.   EVAL: Patient underwent knee surgery in November and has been doing exercises at home, but needs more help in how to progress her ROM and strength. The pain is better since the surgery. Still has pain in the knee if she twists and when she goes down the stairs. Patient was diagnosed with MS in 2013 and  feels her balance, coordination, and strength is effected on the Rt side. She does have spasticity in the calf. She also has neuropathy in both feet.   PERTINENT HISTORY: MS PAIN:  Are you having pain? Yes: NPRS scale: none currently; at worst 3 /10 Pain location: Rt anterior knee Pain description: tender Aggravating factors: transitioning to standing after prolonged sitting   Relieving factors: straightening the knee   PRECAUTIONS: Fall  WEIGHT BEARING RESTRICTIONS: No  FALLS:  Has patient fallen in last 6 months? Yes. Number of falls 2; tripped over grate and fell, LOB and fell  LIVING ENVIRONMENT: Lives with: lives with their daughter Lives in: House/apartment Stairs: Yes: Internal: 2 x 7 steps; on left going up and External: 7 steps; can reach both Has following equipment at home: Single point cane, Quad cane small base, and Walker - 2 wheeled  OCCUPATION: work from home   PATIENT GOALS: "get the full ROM, guidance in exercise."   OBJECTIVE:  Note: Objective measures were completed at Evaluation unless otherwise noted.  DIAGNOSTIC FINDINGS:  Rt knee MRI: IMPRESSION: 1. Small radial tear of the lateral meniscus midbody. 2. 1.7 cm unstable osteochondral lesion of the peripheral lateral femoral condyle. 3. 4.1 cm synovial mass in the lateral joint space adjacent to the patella, possibly focal nodular synovitis. 4. 2.3 cm multiloculated cyst along the medial joint line  could represent a parameniscal cyst from occult meniscal tear or ganglion cyst. 5. Mild tricompartmental osteoarthritis.  COGNITION: Overall cognitive status: Within functional limits for tasks assessed     SENSATION: Not tested; patient endorses bilateral neuropathy   EDEMA:  Unable to inspect knee due to clothing  POSTURE: weight shifts Lt, maintains RLE abducted with foot ER.   PALPATION: TTP lateral aspect of Rt knee   LOWER EXTREMITY ROM: Active ROM Right eval Left eval Right 11/12/23  11/23/23 Right  01/04/24 Right  01/27/24 Right   Hip flexion        Hip extension        Hip abduction        Hip adduction        Hip internal rotation        Hip external rotation        Knee flexion 85 in sitting  92 in sitting 96 in sitting  100 in sitting    Knee extension Lacking 4    Lacking 2 0  Ankle dorsiflexion        Ankle plantarflexion        Ankle inversion        Ankle eversion         (Blank rows = not tested)  LOWER EXTREMITY MMT: MMT Right eval Left eval 01/04/24 Right   Hip flexion 3- 5 3+  Hip extension     Hip abduction     Hip adduction     Hip internal rotation     Hip external rotation     Knee flexion 4- 5 4+  Knee extension 4- 5 4+  Ankle dorsiflexion 3- 5 3-  Ankle plantarflexion 3+ in sitting 5 3+ in sitting   Ankle inversion 3- 5 3+  Ankle eversion 3- 5 3-   (Blank rows = not tested)  FUNCTIONAL TESTS:  Sit to stand: maintains Rt knee in extension; significant use of BUE  Stair negotiation: reciprocal stair negotiation with ascent; step to descent   GAIT: Distance walked: 20 ft  Assistive device utilized: None Level of assistance: Complete Independence Comments: circumduction RLE, decreased knee flexion RLE, Rt foot ER, lateral trunk lean [facility] Adult PT Treatment:                                                DATE: 01/27/24 Therapeutic Exercise: LAQ 2 x 10  Toe raises 2 x 10   Therapeutic Activity: Stair training- step to descent (attempted reciprocal, but patient fearful to perform) single handrail use; reciprocal ascent single handrail  Step down forward 4 inch BUE support x 10, x 10 single UE support  Step down forward 6 inch BUE support x 10, x 10 single UE support  Step down forward 8 inch single [place] support x 10    [facility] Adult PT Treatment:                                                DATE: 01/25/24  Neuromuscular re-ed: Semi-tandem 3 x 30 sec each  Romberg x 30 sec Romberg eyes closed 5 trials 3-30 seconds  Tandem multiple  trials 5-20 seconds  Romberg with vertical and horizontal head turns x 10 each  Romberg on airex 3 x 30 sec  SLS 3 trials each: LLE 4 seconds; RLE 1 second SLS RLE with LLE on orange ball and finger tip 3 trials, 5 seconds  Therapeutic Activity: Sit to stand 2 x 5    [facility] Adult PT Treatment:                                                DATE: 01/20/2024 Therapeutic Exercise: Lateral ankle stretch on rocker board Standing gastroc stretch with forefoot on low mat roll --> shifting forward Squats Step up/down 4" --> 6" steps Neuromuscular re-ed: SAQ (green bolster) + 2#AW 2x10 AAROM small range SLR 2x10 (stretch strap for foot alignment) Side Lying AAROM hip abduction in ER (stretch strap for foot alignment) Lateral ankle mobility + YTB at lateral malleolus   [facility] Adult PT Treatment:                                                DATE: 01/18/24 Neuromuscular re-ed: Tandem 3 trials each  SLS RLE attempted unable  Resisted backward walking x 10; 5 lbs  Staggered stance (kickback) with opposite hand reaching to chair  Therapeutic Activity: sit to stand with overpressure from PT for foot contact x 10 Leg press 2 x 10 @ 65 lbs with blue band at thighs for glute activation  Squat with UE support x 10  Forward step down 4 inch 2 x10   PATIENT EDUCATION:  Education details: HEP review Person educated: Patient Education method: Programmer, multimedia, demo  Education comprehension: verbalized understanding, returned demo  HOME EXERCISE PROGRAM: Access Code: [email or web address] URL: [email or web address] Date: 01/18/2024 Prepared by: Letitia Libra  Exercises - Gastroc Stretch on Wall  - 1 x daily - 7 x weekly - 2 sets - 30 sec hold - Seated Knee Flexion AAROM  - 1 x daily - 7 x weekly - 1 sets - 10 reps - 5 sec  hold - Standing Hip Abduction with Counter Support  - 1 x daily - 7 x weekly - 3 sets - 10 reps - Long Sitting Isometric Hip Abduction with Ball at [employer]  - 1 x daily - 7 x weekly  - 3 sets - 10 reps - Seated Long Arc Quad  - 1 x daily - 7 x weekly - 2 sets - 10 reps - Seated Toe Raise  - 1 x daily - 7 x weekly - 2 sets - 10 reps - Forward Step Down  - 1 x daily - 7 x weekly - 2 sets - 10 reps - Mini Squat with Counter Support  - 1 x daily - 7 x weekly - 2 sets - 10 reps - Tandem Stance  - 1 x daily - 7 x weekly - 3 sets - max  hold - Single Leg Stance with Support  - 1 x daily - 7 x weekly - 3 sets - max hold  ASSESSMENT:  CLINICAL IMPRESSION: Focused on stair training today. Patient was fearful to perform reciprocal stair descent with single handrail use at flight of stairs in [facility]. Able to complete stair ascent with reciprocal pattern with single handrail use. Worked on step downs on the RLE reducing UE support as able and increasing step  height with patient able to perform step down from 8 inch step with single UE support. No reports of knee pain with stepping activity.  She demonstrates full knee extension AROM, having met this LTG.   From eval: Patient is a 48 y.o. female who was seen today for physical therapy evaluation and treatment for s/p Rt knee arthroscopy, debridement of osteochondral lesion and partial lateral meniscectomy on 09/13/23. She also has a history of MS reporting balance, strength and coordination deficits on the RLE. Upon assessment she is noted to have limited Rt knee ROM, RLE weakness, difficulty with transfers and stair negotiation, and gait abnormalities. She will benefit from skilled PT to address the above stated deficits in order to optimize her function and assist with pain reduction.   OBJECTIVE IMPAIRMENTS: Abnormal gait, decreased activity tolerance, decreased balance, decreased coordination, difficulty walking, decreased ROM, decreased strength, hypomobility, impaired sensation, improper body mechanics, postural dysfunction, and pain.   GOALS: Goals reviewed with patient? Yes  SHORT TERM GOALS: Target date: 12/01/2023  Patient  will be independent and compliant with initial HEP.  Baseline: issued at eval  Goal status: MET  2.  Patient will perform sit to stand without UE support to improve ease of transfers.  Baseline: see above  11/23/23: requires use of UE support due to pain/weakness  01/04/24: no use of UE support  Goal status: MET   3.  Patient will demonstrate at least 100 degrees of Rt knee flexion AROM to improve gait mechanics.  Baseline: see above  Goal status: MET    LONG TERM GOALS: Target date: 02/19/24  Patient will be able to descend stairs with reciprocal pattern with handrail support.  Baseline: step to  01/04/24: step to descent  Goal status: ongoing   2.  Patient will demonstrate full Rt knee extension to improve gait mechanics.  Baseline: see above  Goal status: MET   3.  Patient will return to dance activity without limitation as it relates to her knee.  Baseline: currently using brace and avoiding twisting motions.  01/04/24: attempted previous dance activity, but this aggravated the knee  Goal status: progressing  4.  Patient will be independent with advanced home program to progress/maintain current level of function.  Baseline: initial HEP issued  Goal status: progressing   PLAN:  PT FREQUENCY: 2x/week  PT DURATION: 6 weeks  PLANNED INTERVENTIONS: 97164- PT Re-evaluation, 97110-Therapeutic exercises, 97530- Therapeutic activity, 97112- Neuromuscular re-education, 97535- Self Care, 84132- Manual therapy, [id number]- Gait training, 97014- Electrical stimulation (unattended), [id number]- Electrical stimulation (manual), 97016- Vasopneumatic device, Dry Needling, Joint mobilization, and Cryotherapy  PLAN FOR NEXT SESSION: Work on knee & ankle ROM/strength, sit to stand transfers; quad/hamstring strengthening. Stair negotiation - reciprocal step down, activities to allow for full foot contact with closed chain tasks. Static balance   Letitia Libra, PT, DPT, ATC 01/27/24 4:19 PM

## 2024-01-31 ENCOUNTER — Ambulatory Visit

## 2024-01-31 DIAGNOSIS — R2689 Other abnormalities of gait and mobility: Secondary | ICD-10-CM

## 2024-01-31 DIAGNOSIS — M6281 Muscle weakness (generalized): Secondary | ICD-10-CM

## 2024-01-31 DIAGNOSIS — M25561 Pain in right knee: Secondary | ICD-10-CM

## 2024-01-31 DIAGNOSIS — R2681 Unsteadiness on feet: Secondary | ICD-10-CM

## 2024-01-31 NOTE — Therapy (Signed)
 OUTPATIENT PHYSICAL THERAPY LOWER EXTREMITY TREATMENT   Patient Name: Karen Oconnor MRN: [id number] DOB:Mar 28, 1976, 48 y.o., female Today's Date: 01/31/2024  END OF SESSION:  PT End of Session - 01/31/24 1405     Visit Number 24    Date for PT Re-Evaluation 02/19/24    Authorization Type BCBS    PT Start Time 1405    PT Stop Time 1445    PT Time Calculation (min) 40 min    Activity Tolerance Patient tolerated treatment well    Behavior During Therapy [facility] for tasks assessed/performed            Past Medical History:  Diagnosis Date   Diabetes (HCC)    MS (multiple sclerosis) (HCC)    Past Surgical History:  Procedure Laterality Date   ABDOMINAL HYSTERECTOMY  2016   Patient Active Problem List   Diagnosis Date Noted   Lateral meniscal tear 09/10/2023   Pain of knee joint with osteochondral injury 09/10/2023   Transverse myelitis (HCC) 02/25/2022   Multiple sclerosis (HCC) 02/25/2022   Right foot drop 02/25/2022   High risk medication use 02/25/2022   Allodynia 02/25/2022    PCP: Harvie Heck, MD  REFERRING PROVIDER: Eldred Manges, MD   REFERRING DIAG: [phone] (ICD-10-CM) - Other tear of lateral meniscus of right knee, unspecified whether old or current tear, sequela   THERAPY DIAG:  Acute pain of right knee  Muscle weakness (generalized)  Other abnormalities of gait and mobility  Unsteadiness on feet  Rationale for Evaluation and Treatment: Rehabilitation  ONSET DATE: 09/13/23  SUBJECTIVE:   SUBJECTIVE STATEMENT: Patient reports stairs are feeling better and went to dace class twice last week and is trying for two classes again this week.    EVAL: Patient underwent knee surgery in November and has been doing exercises at home, but needs more help in how to progress her ROM and strength. The pain is better since the surgery. Still has pain in the knee if she twists and when she goes down the stairs. Patient was diagnosed with MS in 2013 and feels her  balance, coordination, and strength is effected on the Rt side. She does have spasticity in the calf. She also has neuropathy in both feet.   PERTINENT HISTORY: MS PAIN:  Are you having pain? Yes: NPRS scale: none currently; at worst 3 /10 Pain location: Rt anterior knee Pain description: tender Aggravating factors: transitioning to standing after prolonged sitting   Relieving factors: straightening the knee   PRECAUTIONS: Fall  WEIGHT BEARING RESTRICTIONS: No  FALLS:  Has patient fallen in last 6 months? Yes. Number of falls 2; tripped over grate and fell, LOB and fell  LIVING ENVIRONMENT: Lives with: lives with their daughter Lives in: House/apartment Stairs: Yes: Internal: 2 x 7 steps; on left going up and External: 7 steps; can reach both Has following equipment at home: Single point cane, Quad cane small base, and Walker - 2 wheeled  OCCUPATION: work from home   PATIENT GOALS: "get the full ROM, guidance in exercise."   OBJECTIVE:  Note: Objective measures were completed at Evaluation unless otherwise noted.  DIAGNOSTIC FINDINGS:  Rt knee MRI: IMPRESSION: 1. Small radial tear of the lateral meniscus midbody. 2. 1.7 cm unstable osteochondral lesion of the peripheral lateral femoral condyle. 3. 4.1 cm synovial mass in the lateral joint space adjacent to the patella, possibly focal nodular synovitis. 4. 2.3 cm multiloculated cyst along the medial joint line could represent a parameniscal cyst from occult  meniscal tear or ganglion cyst. 5. Mild tricompartmental osteoarthritis.  COGNITION: Overall cognitive status: Within functional limits for tasks assessed     SENSATION: Not tested; patient endorses bilateral neuropathy   EDEMA:  Unable to inspect knee due to clothing  POSTURE: weight shifts Lt, maintains RLE abducted with foot ER.   PALPATION: TTP lateral aspect of Rt knee   LOWER EXTREMITY ROM: Active ROM Right eval Left eval Right 11/12/23  11/23/23 Right  01/04/24 Right  01/27/24 Right   Hip flexion        Hip extension        Hip abduction        Hip adduction        Hip internal rotation        Hip external rotation        Knee flexion 85 in sitting  92 in sitting 96 in sitting  100 in sitting    Knee extension Lacking 4    Lacking 2 0  Ankle dorsiflexion        Ankle plantarflexion        Ankle inversion        Ankle eversion         (Blank rows = not tested)  LOWER EXTREMITY MMT: MMT Right eval Left eval 01/04/24 Right   Hip flexion 3- 5 3+  Hip extension     Hip abduction     Hip adduction     Hip internal rotation     Hip external rotation     Knee flexion 4- 5 4+  Knee extension 4- 5 4+  Ankle dorsiflexion 3- 5 3-  Ankle plantarflexion 3+ in sitting 5 3+ in sitting   Ankle inversion 3- 5 3+  Ankle eversion 3- 5 3-   (Blank rows = not tested)  FUNCTIONAL TESTS:  Sit to stand: maintains Rt knee in extension; significant use of BUE  Stair negotiation: reciprocal stair negotiation with ascent; step to descent   GAIT: Distance walked: 20 ft  Assistive device utilized: None Level of assistance: Complete Independence Comments: circumduction RLE, decreased knee flexion RLE, Rt foot ER, lateral trunk lean   [facility] Adult PT Treatment:                                                DATE: 01/31/2024 Therapeutic Exercise: Recumbent bike L1 x Standing heel raises x20 LAQ + 2#AW --> added ankle pump Standing R knee flexion Neuromuscular re-ed: Prone quad set + ankle DF assist Quadruped: Hydrants  Hip extension + rock back stretch  Fwd/bkwd & side/side rocks on rocker board Neutral rocker board balance Therapeutic Activity: 6" stepper: R step up fwd, L step down bkwd + 1 railing  R step up and over + 1 railing   [facility] Adult PT Treatment:                                                DATE: 01/27/24 Therapeutic Exercise: LAQ 2 x 10  Toe raises 2 x 10  Therapeutic Activity: Stair training- step to  descent (attempted reciprocal, but patient fearful to perform) single handrail use; reciprocal ascent single handrail  Step down forward 4 inch BUE support x 10,  x 10 single UE support  Step down forward 6 inch BUE support x 10, x 10 single UE support  Step down forward 8 inch single [place] support x 10    [facility] Adult PT Treatment:                                                DATE: 01/25/24 Neuromuscular re-ed: Semi-tandem 3 x 30 sec each  Romberg x 30 sec Romberg eyes closed 5 trials 3-30 seconds  Tandem multiple trials 5-20 seconds  Romberg with vertical and horizontal head turns x 10 each  Romberg on airex 3 x 30 sec  SLS 3 trials each: LLE 4 seconds; RLE 1 second SLS RLE with LLE on orange ball and finger tip 3 trials, 5 seconds  Therapeutic Activity: Sit to stand 2 x 5     PATIENT EDUCATION:  Education details: HEP review Person educated: Patient Education method: Programmer, multimedia, demo  Education comprehension: verbalized understanding, returned demo  HOME EXERCISE PROGRAM: Access Code: [email or web address] URL: [email or web address] Date: 01/18/2024 Prepared by: Letitia Libra  Exercises - Gastroc Stretch on Wall  - 1 x daily - 7 x weekly - 2 sets - 30 sec hold - Seated Knee Flexion AAROM  - 1 x daily - 7 x weekly - 1 sets - 10 reps - 5 sec  hold - Standing Hip Abduction with Counter Support  - 1 x daily - 7 x weekly - 3 sets - 10 reps - Long Sitting Isometric Hip Abduction with Ball at [employer]  - 1 x daily - 7 x weekly - 3 sets - 10 reps - Seated Long Arc Quad  - 1 x daily - 7 x weekly - 2 sets - 10 reps - Seated Toe Raise  - 1 x daily - 7 x weekly - 2 sets - 10 reps - Forward Step Down  - 1 x daily - 7 x weekly - 2 sets - 10 reps - Mini Squat with Counter Support  - 1 x daily - 7 x weekly - 2 sets - 10 reps - Tandem Stance  - 1 x daily - 7 x weekly - 3 sets - max  hold - Single Leg Stance with Support  - 1 x daily - 7 x weekly - 3 sets - max hold  ASSESSMENT:  CLINICAL  IMPRESSION: Reciprocal stepping continued with one railing; patient demonstrated improved eccentric control on descend. Ankle pumps added to resisted knee extension. Initial R knee pian/stiffness on recumbent bike that decreased quickly as joint mobility warmed-up.  From eval: Patient is a 48 y.o. female who was seen today for physical therapy evaluation and treatment for s/p Rt knee arthroscopy, debridement of osteochondral lesion and partial lateral meniscectomy on 09/13/23. She also has a history of MS reporting balance, strength and coordination deficits on the RLE. Upon assessment she is noted to have limited Rt knee ROM, RLE weakness, difficulty with transfers and stair negotiation, and gait abnormalities. She will benefit from skilled PT to address the above stated deficits in order to optimize her function and assist with pain reduction.   OBJECTIVE IMPAIRMENTS: Abnormal gait, decreased activity tolerance, decreased balance, decreased coordination, difficulty walking, decreased ROM, decreased strength, hypomobility, impaired sensation, improper body mechanics, postural dysfunction, and pain.   GOALS: Goals reviewed with patient? Yes  SHORT TERM GOALS: Target date: 12/01/2023  Patient will be independent and compliant with initial HEP.  Baseline: issued at eval  Goal status: MET  2.  Patient will perform sit to stand without UE support to improve ease of transfers.  Baseline: see above  11/23/23: requires use of UE support due to pain/weakness  01/04/24: no use of UE support  Goal status: MET   3.  Patient will demonstrate at least 100 degrees of Rt knee flexion AROM to improve gait mechanics.  Baseline: see above  Goal status: MET    LONG TERM GOALS: Target date: 02/19/24  Patient will be able to descend stairs with reciprocal pattern with handrail support.  Baseline: step to  01/04/24: step to descent  Goal status: ongoing   2.  Patient will demonstrate full Rt knee extension to  improve gait mechanics.  Baseline: see above  Goal status: MET   3.  Patient will return to dance activity without limitation as it relates to her knee.  Baseline: currently using brace and avoiding twisting motions.  01/04/24: attempted previous dance activity, but this aggravated the knee  Goal status: progressing  4.  Patient will be independent with advanced home program to progress/maintain current level of function.  Baseline: initial HEP issued  Goal status: progressing   PLAN:  PT FREQUENCY: 2x/week  PT DURATION: 6 weeks  PLANNED INTERVENTIONS: 97164- PT Re-evaluation, 97110-Therapeutic exercises, 97530- Therapeutic activity, 97112- Neuromuscular re-education, 97535- Self Care, 16109- Manual therapy, [id number]- Gait training, 97014- Electrical stimulation (unattended), [id number]- Electrical stimulation (manual), 97016- Vasopneumatic device, Dry Needling, Joint mobilization, and Cryotherapy  PLAN FOR NEXT SESSION: Work on knee & ankle ROM/strength, sit to stand transfers; quad/hamstring strengthening. Stair negotiation - reciprocal step down, activities to allow for full foot contact with closed chain tasks. Static balance   Carlynn Herald, PTA 01/31/24 2:46 PM

## 2024-02-01 ENCOUNTER — Encounter

## 2024-02-02 ENCOUNTER — Ambulatory Visit

## 2024-02-02 DIAGNOSIS — M25561 Pain in right knee: Secondary | ICD-10-CM

## 2024-02-02 DIAGNOSIS — R2681 Unsteadiness on feet: Secondary | ICD-10-CM

## 2024-02-02 DIAGNOSIS — M6281 Muscle weakness (generalized): Secondary | ICD-10-CM

## 2024-02-02 DIAGNOSIS — R2689 Other abnormalities of gait and mobility: Secondary | ICD-10-CM

## 2024-02-02 NOTE — Therapy (Signed)
 OUTPATIENT PHYSICAL THERAPY LOWER EXTREMITY TREATMENT   Patient Name: Karen Oconnor MRN: [id number] DOB:Mar 22, 1976, 48 y.o., female Today's Date: 02/02/2024  END OF SESSION:  PT End of Session - 02/02/24 1023     Visit Number 25    Number of Visits 29    Date for PT Re-Evaluation 02/19/24    Authorization Type BCBS    PT Start Time 1022    PT Stop Time 1100    PT Time Calculation (min) 38 min    Activity Tolerance Patient tolerated treatment well    Behavior During Therapy WFL for tasks assessed/performed            Past Medical History:  Diagnosis Date   Diabetes (HCC)    MS (multiple sclerosis) (HCC)    Past Surgical History:  Procedure Laterality Date   ABDOMINAL HYSTERECTOMY  2016   Patient Active Problem List   Diagnosis Date Noted   Lateral meniscal tear 09/10/2023   Pain of knee joint with osteochondral injury 09/10/2023   Transverse myelitis (HCC) 02/25/2022   Multiple sclerosis (HCC) 02/25/2022   Right foot drop 02/25/2022   High risk medication use 02/25/2022   Allodynia 02/25/2022    PCP: Karen Heck, MD  REFERRING PROVIDER: Eldred Manges, MD   REFERRING DIAG: [phone] (ICD-10-CM) - Other tear of lateral meniscus of right knee, unspecified whether old or current tear, sequela   THERAPY DIAG:  Acute pain of right knee  Muscle weakness (generalized)  Other abnormalities of gait and mobility  Unsteadiness on feet  Rationale for Evaluation and Treatment: Rehabilitation  ONSET DATE: 09/13/23  SUBJECTIVE:   SUBJECTIVE STATEMENT: Patient reports she went to dance class yesterday and has minimal pain in knee but feels limitations with bending knee beyond 90 degrees.   EVAL: Patient underwent knee surgery in November and has been doing exercises at home, but needs more help in how to progress her ROM and strength. The pain is better since the surgery. Still has pain in the knee if she twists and when she goes down the stairs. Patient was  diagnosed with MS in 2013 and feels her balance, coordination, and strength is effected on the Rt side. She does have spasticity in the calf. She also has neuropathy in both feet.   PERTINENT HISTORY: MS PAIN:  Are you having pain? Yes: NPRS scale: none currently; at worst 3 /10 Pain location: Rt anterior knee Pain description: tender Aggravating factors: transitioning to standing after prolonged sitting   Relieving factors: straightening the knee   PRECAUTIONS: Fall  WEIGHT BEARING RESTRICTIONS: No  FALLS:  Has patient fallen in last 6 months? Yes. Number of falls 2; tripped over grate and fell, LOB and fell  LIVING ENVIRONMENT: Lives with: lives with their daughter Lives in: House/apartment Stairs: Yes: Internal: 2 x 7 steps; on left going up and External: 7 steps; can reach both Has following equipment at home: Single point cane, Quad cane small base, and Walker - 2 wheeled  OCCUPATION: work from home   PATIENT GOALS: "get the full ROM, guidance in exercise."   OBJECTIVE:  Note: Objective measures were completed at Evaluation unless otherwise noted.  DIAGNOSTIC FINDINGS:  Rt knee MRI: IMPRESSION: 1. Small radial tear of the lateral meniscus midbody. 2. 1.7 cm unstable osteochondral lesion of the peripheral lateral femoral condyle. 3. 4.1 cm synovial mass in the lateral joint space adjacent to the patella, possibly focal nodular synovitis. 4. 2.3 cm multiloculated cyst along the medial joint line could  represent a parameniscal cyst from occult meniscal tear or ganglion cyst. 5. Mild tricompartmental osteoarthritis.  COGNITION: Overall cognitive status: Within functional limits for tasks assessed     SENSATION: Not tested; patient endorses bilateral neuropathy   EDEMA:  Unable to inspect knee due to clothing  POSTURE: weight shifts Lt, maintains RLE abducted with foot ER.   PALPATION: TTP lateral aspect of Rt knee   LOWER EXTREMITY ROM: Active ROM  Right eval Left eval Right 11/12/23 11/23/23 Right  01/04/24 Right  01/27/24 Right   Hip flexion        Hip extension        Hip abduction        Hip adduction        Hip internal rotation        Hip external rotation        Knee flexion 85 in sitting  92 in sitting 96 in sitting  100 in sitting    Knee extension Lacking 4    Lacking 2 0  Ankle dorsiflexion        Ankle plantarflexion        Ankle inversion        Ankle eversion         (Blank rows = not tested)  LOWER EXTREMITY MMT: MMT Right eval Left eval 01/04/24 Right   Hip flexion 3- 5 3+  Hip extension     Hip abduction     Hip adduction     Hip internal rotation     Hip external rotation     Knee flexion 4- 5 4+  Knee extension 4- 5 4+  Ankle dorsiflexion 3- 5 3-  Ankle plantarflexion 3+ in sitting 5 3+ in sitting   Ankle inversion 3- 5 3+  Ankle eversion 3- 5 3-   (Blank rows = not tested)  FUNCTIONAL TESTS:  Sit to stand: maintains Rt knee in extension; significant use of BUE  Stair negotiation: reciprocal stair negotiation with ascent; step to descent   GAIT: Distance walked: 20 ft  Assistive device utilized: None Level of assistance: Complete Independence Comments: circumduction RLE, decreased knee flexion RLE, Rt foot ER, lateral trunk lean   [facility] Adult PT Treatment:                                                DATE: 02/02/2024 Therapeutic Exercise: NuStep L6 x 5 min Prone quad stretches with strap 3x30" 1/2 long sitting HS/DF stretch 2x30" --> HS stretch + small ankle pumps with black TB Neuromuscular re-ed: Prone: Knee flexion + 1#AW --> SBA for leg tracking x10 Knee bent hip extension --> SBA for leg tracking x10  Therapeutic Activity: STS from hi/low table Squats with seat tap on elevated table    [facility] Adult PT Treatment:                                                DATE: 01/31/2024 Therapeutic Exercise: Recumbent bike L1 x Standing heel raises x20 LAQ + 2#AW --> added ankle  pump Standing R knee flexion Neuromuscular re-ed: Prone quad set + ankle DF assist Quadruped: Hydrants  Hip extension + rock back stretch  Fwd/bkwd & side/side rocks on rocker  board Neutral rocker board balance Therapeutic Activity: 6" stepper: R step up fwd, L step down bkwd + 1 railing  R step up and over + 1 railing   [facility] Adult PT Treatment:                                                DATE: 01/27/24 Therapeutic Exercise: LAQ 2 x 10  Toe raises 2 x 10  Therapeutic Activity: Stair training- step to descent (attempted reciprocal, but patient fearful to perform) single handrail use; reciprocal ascent single handrail  Step down forward 4 inch BUE support x 10, x 10 single UE support  Step down forward 6 inch BUE support x 10, x 10 single UE support  Step down forward 8 inch single [place] support x 10    PATIENT EDUCATION:  Education details: HEP review Person educated: Patient Education method: Programmer, multimedia, demo  Education comprehension: verbalized understanding, returned demo  HOME EXERCISE PROGRAM: Access Code: [email or web address] URL: [email or web address] Date: 01/18/2024 Prepared by: Letitia Libra  Exercises - Gastroc Stretch on Wall  - 1 x daily - 7 x weekly - 2 sets - 30 sec hold - Seated Knee Flexion AAROM  - 1 x daily - 7 x weekly - 1 sets - 10 reps - 5 sec  hold - Standing Hip Abduction with Counter Support  - 1 x daily - 7 x weekly - 3 sets - 10 reps - Long Sitting Isometric Hip Abduction with Ball at [employer]  - 1 x daily - 7 x weekly - 3 sets - 10 reps - Seated Long Arc Quad  - 1 x daily - 7 x weekly - 2 sets - 10 reps - Seated Toe Raise  - 1 x daily - 7 x weekly - 2 sets - 10 reps - Forward Step Down  - 1 x daily - 7 x weekly - 2 sets - 10 reps - Mini Squat with Counter Support  - 1 x daily - 7 x weekly - 2 sets - 10 reps - Tandem Stance  - 1 x daily - 7 x weekly - 3 sets - max  hold - Single Leg Stance with Support  - 1 x daily - 7 x weekly - 3 sets - max  hold  ASSESSMENT:  CLINICAL IMPRESSION: Stand by assist provided for lower leg tracking during hamstring strengthening exercises; noted hamstring fatigue with prone knee flexion AROM. Patient demonstrated improved awareness with weight bearing on R LE during sit to stand and standing squats.   From eval: Patient is a 48 y.o. female who was seen today for physical therapy evaluation and treatment for s/p Rt knee arthroscopy, debridement of osteochondral lesion and partial lateral meniscectomy on 09/13/23. She also has a history of MS reporting balance, strength and coordination deficits on the RLE. Upon assessment she is noted to have limited Rt knee ROM, RLE weakness, difficulty with transfers and stair negotiation, and gait abnormalities. She will benefit from skilled PT to address the above stated deficits in order to optimize her function and assist with pain reduction.   OBJECTIVE IMPAIRMENTS: Abnormal gait, decreased activity tolerance, decreased balance, decreased coordination, difficulty walking, decreased ROM, decreased strength, hypomobility, impaired sensation, improper body mechanics, postural dysfunction, and pain.   GOALS: Goals reviewed with patient? Yes  SHORT TERM GOALS: Target date: 12/01/2023  Patient will be independent  and compliant with initial HEP.  Baseline: issued at eval  Goal status: MET  2.  Patient will perform sit to stand without UE support to improve ease of transfers.  Baseline: see above  11/23/23: requires use of UE support due to pain/weakness  01/04/24: no use of UE support  Goal status: MET   3.  Patient will demonstrate at least 100 degrees of Rt knee flexion AROM to improve gait mechanics.  Baseline: see above  Goal status: MET    LONG TERM GOALS: Target date: 02/19/24  Patient will be able to descend stairs with reciprocal pattern with handrail support.  Baseline: step to  01/04/24: step to descent  Goal status: ongoing   2.  Patient will  demonstrate full Rt knee extension to improve gait mechanics.  Baseline: see above  Goal status: MET   3.  Patient will return to dance activity without limitation as it relates to her knee.  Baseline: currently using brace and avoiding twisting motions.  01/04/24: attempted previous dance activity, but this aggravated the knee  Goal status: progressing  4.  Patient will be independent with advanced home program to progress/maintain current level of function.  Baseline: initial HEP issued  Goal status: progressing   PLAN:  PT FREQUENCY: 2x/week  PT DURATION: 6 weeks  PLANNED INTERVENTIONS: 97164- PT Re-evaluation, 97110-Therapeutic exercises, 97530- Therapeutic activity, 97112- Neuromuscular re-education, 97535- Self Care, 09811- Manual therapy, [id number]- Gait training, 97014- Electrical stimulation (unattended), [id number]- Electrical stimulation (manual), 97016- Vasopneumatic device, Dry Needling, Joint mobilization, and Cryotherapy  PLAN FOR NEXT SESSION: Work on knee & ankle ROM/strength, sit to stand transfers; quad/hamstring strengthening. Stair negotiation - reciprocal step down, activities to allow for full foot contact with closed chain tasks. Static balance   Carlynn Herald, PTA 02/02/24 11:03 AM

## 2024-02-08 ENCOUNTER — Ambulatory Visit

## 2024-02-08 DIAGNOSIS — M25561 Pain in right knee: Secondary | ICD-10-CM

## 2024-02-08 DIAGNOSIS — M6281 Muscle weakness (generalized): Secondary | ICD-10-CM

## 2024-02-08 DIAGNOSIS — R2689 Other abnormalities of gait and mobility: Secondary | ICD-10-CM

## 2024-02-08 NOTE — Therapy (Signed)
 OUTPATIENT PHYSICAL THERAPY LOWER EXTREMITY TREATMENT   Patient Name: Shenaya Lebo MRN: [id number] DOB:[birth year], 48 y.o., female Today's Date: 02/08/2024  END OF SESSION:  PT End of Session - 02/08/24 1149     Visit Number 26    Number of Visits 29    Date for PT Re-Evaluation 02/19/24    Authorization Type BCBS    PT Start Time 1149    PT Stop Time 1227    PT Time Calculation (min) 38 min    Activity Tolerance Patient tolerated treatment well    Behavior During Therapy WFL for tasks assessed/performed             Past Medical History:  Diagnosis Date   Diabetes (HCC)    MS (multiple sclerosis) (HCC)    Past Surgical History:  Procedure Laterality Date   ABDOMINAL HYSTERECTOMY  2016   Patient Active Problem List   Diagnosis Date Noted   Lateral meniscal tear 09/10/2023   Pain of knee joint with osteochondral injury 09/10/2023   Transverse myelitis (HCC) 02/25/2022   Multiple sclerosis (HCC) 02/25/2022   Right foot drop 02/25/2022   High risk medication use 02/25/2022   Allodynia 02/25/2022    PCP: Nash Bade, MD  REFERRING PROVIDER: Adah Acron, MD   REFERRING DIAG: [phone] (ICD-10-CM) - Other tear of lateral meniscus of right knee, unspecified whether old or current tear, sequela   THERAPY DIAG:  Acute pain of right knee  Muscle weakness (generalized)  Other abnormalities of gait and mobility  Rationale for Evaluation and Treatment: Rehabilitation  ONSET DATE: 09/13/23  SUBJECTIVE:   SUBJECTIVE STATEMENT: Patient has been able to participate in dance and feels that she is back to her baseline with dancing, but does wear the knee brace. Stairs are getting better, but still needs to work on them.   EVAL: Patient underwent knee surgery in November and has been doing exercises at home, but needs more help in how to progress her ROM and strength. The pain is better since the surgery. Still has pain in the knee if she twists and when she goes  down the stairs. Patient was diagnosed with MS in 2013 and feels her balance, coordination, and strength is effected on the Rt side. She does have spasticity in the calf. She also has neuropathy in both feet.   PERTINENT HISTORY: MS PAIN:  Are you having pain? Yes: NPRS scale: none currently; at worst 1/10 Pain location: Rt anterior knee Pain description: tender Aggravating factors: transitioning to standing after prolonged sitting   Relieving factors: straightening the knee   PRECAUTIONS: Fall  WEIGHT BEARING RESTRICTIONS: No  FALLS:  Has patient fallen in last 6 months? Yes. Number of falls 2; tripped over grate and fell, LOB and fell  LIVING ENVIRONMENT: Lives with: lives with their daughter Lives in: House/apartment Stairs: Yes: Internal: 2 x 7 steps; on left going up and External: 7 steps; can reach both Has following equipment at home: Single point cane, Quad cane small base, and Walker - 2 wheeled  OCCUPATION: work from home   PATIENT GOALS: "get the full ROM, guidance in exercise."   OBJECTIVE:  Note: Objective measures were completed at Evaluation unless otherwise noted.  DIAGNOSTIC FINDINGS:  Rt knee MRI: IMPRESSION: 1. Small radial tear of the lateral meniscus midbody. 2. 1.7 cm unstable osteochondral lesion of the peripheral lateral femoral condyle. 3. 4.1 cm synovial mass in the lateral joint space adjacent to the patella, possibly focal nodular synovitis. 4. 2.3  cm multiloculated cyst along the medial joint line could represent a parameniscal cyst from occult meniscal tear or ganglion cyst. 5. Mild tricompartmental osteoarthritis.  COGNITION: Overall cognitive status: Within functional limits for tasks assessed     SENSATION: Not tested; patient endorses bilateral neuropathy   EDEMA:  Unable to inspect knee due to clothing  POSTURE: weight shifts Lt, maintains RLE abducted with foot ER.   PALPATION: TTP lateral aspect of Rt knee   LOWER  EXTREMITY ROM: Active ROM Right eval Left eval Right 11/12/23 11/23/23 Right  01/04/24 Right  01/27/24 Right  02/08/24 Right   Hip flexion         Hip extension         Hip abduction         Hip adduction         Hip internal rotation         Hip external rotation         Knee flexion 85 in sitting  92 in sitting 96 in sitting  100 in sitting   105 sitting   Knee extension Lacking 4    Lacking 2 0   Ankle dorsiflexion         Ankle plantarflexion         Ankle inversion         Ankle eversion          (Blank rows = not tested)  LOWER EXTREMITY MMT: MMT Right eval Left eval 01/04/24 Right   Hip flexion 3- 5 3+  Hip extension     Hip abduction     Hip adduction     Hip internal rotation     Hip external rotation     Knee flexion 4- 5 4+  Knee extension 4- 5 4+  Ankle dorsiflexion 3- 5 3-  Ankle plantarflexion 3+ in sitting 5 3+ in sitting   Ankle inversion 3- 5 3+  Ankle eversion 3- 5 3-   (Blank rows = not tested)  FUNCTIONAL TESTS:  Sit to stand: maintains Rt knee in extension; significant use of BUE  Stair negotiation: reciprocal stair negotiation with ascent; step to descent   GAIT: Distance walked: 20 ft  Assistive device utilized: None Level of assistance: Complete Independence Comments: circumduction RLE, decreased knee flexion RLE, Rt foot ER, lateral trunk lean  [facility] Adult PT Treatment:                                                DATE: 02/08/24 Therapeutic Exercise: Hip bridge 2 x 10  Hooklying resisted hip flexion blue band 2 x 10  Sidelying hip abduction 2 x 10   Therapeutic Activity: Step ups 6 inch single UE support x 10; x 10 no UE support  Step ups 8 inch single UE support 2 x 5  Step taps 8 inch no UE support x 10  Step ups 8 inch no UE support 2 x 5 CGA to SBA    [facility] Adult PT Treatment:                                                DATE: 02/02/2024 Therapeutic Exercise: NuStep L6 x 5 min Prone quad stretches with strap  3x30" 1/2 long  sitting HS/DF stretch 2x30" --> HS stretch + small ankle pumps with black TB Neuromuscular re-ed: Prone: Knee flexion + 1#AW --> SBA for leg tracking x10 Knee bent hip extension --> SBA for leg tracking x10  Therapeutic Activity: STS from hi/low table Squats with seat tap on elevated table    [facility] Adult PT Treatment:                                                DATE: 01/31/2024 Therapeutic Exercise: Recumbent bike L1 x Standing heel raises x20 LAQ + 2#AW --> added ankle pump Standing R knee flexion Neuromuscular re-ed: Prone quad set + ankle DF assist Quadruped: Hydrants  Hip extension + rock back stretch  Fwd/bkwd & side/side rocks on rocker board Neutral rocker board balance Therapeutic Activity: 6" stepper: R step up fwd, L step down bkwd + 1 railing  R step up and over + 1 railing   PATIENT EDUCATION:  Education details: HEP update Person educated: Patient Education method: Explanation, demo, cues, handout Education comprehension: verbalized understanding, returned demo, cues   HOME EXERCISE PROGRAM: Access Code: [email or web address] URL: [email or web address] Date: 02/08/2024 Prepared by: Forrestine Ike  Exercises - Gastroc Stretch on Wall  - 1 x daily - 7 x weekly - 2 sets - 30 sec hold - Seated Knee Flexion AAROM  - 1 x daily - 7 x weekly - 1 sets - 10 reps - 5 sec  hold - Standing Hip Abduction with Counter Support  - 1 x daily - 7 x weekly - 3 sets - 10 reps - Long Sitting Isometric Hip Abduction with Ball at [employer]  - 1 x daily - 7 x weekly - 3 sets - 10 reps - Seated Long Arc Quad  - 1 x daily - 7 x weekly - 2 sets - 10 reps - Seated Toe Raise  - 1 x daily - 7 x weekly - 2 sets - 10 reps - Forward Step Down  - 1 x daily - 7 x weekly - 2 sets - 10 reps - Mini Squat with Counter Support  - 1 x daily - 7 x weekly - 2 sets - 10 reps - Tandem Stance  - 1 x daily - 7 x weekly - 3 sets - max  hold - Single Leg Stance with Support  - 1 x daily - 7 x weekly  - 3 sets - max hold - Supine Bridge  - 1 x daily - 7 x weekly - 2 sets - 10 reps - Supine March with Resistance Band  - 1 x daily - 7 x weekly - 2 sets - 10 reps  ASSESSMENT:  CLINICAL IMPRESSION: Continued with functional strength progression with good tolerance. Occasional circumduction swing noted with step ups requiring cues to correct. Able to complete 8 inch step up without UE support with SBA. No reports of pain with stepping activity, but does fatigue in the RLE.   From eval: Patient is a 48 y.o. female who was seen today for physical therapy evaluation and treatment for s/p Rt knee arthroscopy, debridement of osteochondral lesion and partial lateral meniscectomy on 09/13/23. She also has a history of MS reporting balance, strength and coordination deficits on the RLE. Upon assessment she is noted to have limited Rt knee ROM, RLE weakness, difficulty with transfers and stair  negotiation, and gait abnormalities. She will benefit from skilled PT to address the above stated deficits in order to optimize her function and assist with pain reduction.   OBJECTIVE IMPAIRMENTS: Abnormal gait, decreased activity tolerance, decreased balance, decreased coordination, difficulty walking, decreased ROM, decreased strength, hypomobility, impaired sensation, improper body mechanics, postural dysfunction, and pain.   GOALS: Goals reviewed with patient? Yes  SHORT TERM GOALS: Target date: 12/01/2023  Patient will be independent and compliant with initial HEP.  Baseline: issued at eval  Goal status: MET  2.  Patient will perform sit to stand without UE support to improve ease of transfers.  Baseline: see above  11/23/23: requires use of UE support due to pain/weakness  01/04/24: no use of UE support  Goal status: MET   3.  Patient will demonstrate at least 100 degrees of Rt knee flexion AROM to improve gait mechanics.  Baseline: see above  Goal status: MET    LONG TERM GOALS: Target date:  02/19/24  Patient will be able to descend stairs with reciprocal pattern with handrail support.  Baseline: step to  01/04/24: step to descent  Goal status: ongoing   2.  Patient will demonstrate full Rt knee extension to improve gait mechanics.  Baseline: see above  Goal status: MET   3.  Patient will return to dance activity without limitation as it relates to her knee.  Baseline: currently using brace and avoiding twisting motions.  01/04/24: attempted previous dance activity, but this aggravated the knee  Goal status: progressing  4.  Patient will be independent with advanced home program to progress/maintain current level of function.  Baseline: initial HEP issued  Goal status: progressing   PLAN:  PT FREQUENCY: 2x/week  PT DURATION: 6 weeks  PLANNED INTERVENTIONS: 97164- PT Re-evaluation, 97110-Therapeutic exercises, 97530- Therapeutic activity, 97112- Neuromuscular re-education, 97535- Self Care, 16109- Manual therapy, [id number]- Gait training, 97014- Electrical stimulation (unattended), [id number]- Electrical stimulation (manual), 97016- Vasopneumatic device, Dry Needling, Joint mobilization, and Cryotherapy  PLAN FOR NEXT SESSION: Work on knee & ankle ROM/strength, sit to stand transfers; quad/hamstring strengthening. Stair negotiation - reciprocal step down, activities to allow for full foot contact with closed chain tasks. Static balance   Forrestine Ike, PT, DPT, ATC 02/08/24 12:30 PM

## 2024-02-10 ENCOUNTER — Ambulatory Visit

## 2024-02-10 DIAGNOSIS — M6281 Muscle weakness (generalized): Secondary | ICD-10-CM

## 2024-02-10 DIAGNOSIS — M25561 Pain in right knee: Secondary | ICD-10-CM

## 2024-02-10 DIAGNOSIS — R2681 Unsteadiness on feet: Secondary | ICD-10-CM

## 2024-02-10 DIAGNOSIS — R2689 Other abnormalities of gait and mobility: Secondary | ICD-10-CM

## 2024-02-10 NOTE — Therapy (Addendum)
 OUTPATIENT PHYSICAL THERAPY LOWER EXTREMITY TREATMENT  PHYSICAL THERAPY DISCHARGE SUMMARY  Visits from Start of Care: 27  Current functional level related to goals / functional outcomes: All goals met   Remaining deficits: Balance and strength deficits related to MS   Education / Equipment: See education below    Patient agrees to discharge. Patient goals were met. Patient is being discharged due to meeting the stated rehab goals.  Patient Name: Karen Oconnor MRN: [id number] DOB:August 21, 1976, 48 y.o., female Today's Date: 02/10/2024  END OF SESSION:  PT End of Session - 02/10/24 1154     Visit Number 27    Number of Visits 29    Date for PT Re-Evaluation 02/19/24    Authorization Type BCBS    PT Start Time 1154    PT Stop Time 1235    PT Time Calculation (min) 41 min    Activity Tolerance Patient tolerated treatment well    Behavior During Therapy WFL for tasks assessed/performed             Past Medical History:  Diagnosis Date   Diabetes (HCC)    MS (multiple sclerosis) (HCC)    Past Surgical History:  Procedure Laterality Date   ABDOMINAL HYSTERECTOMY  2016   Patient Active Problem List   Diagnosis Date Noted   Lateral meniscal tear 09/10/2023   Pain of knee joint with osteochondral injury 09/10/2023   Transverse myelitis (HCC) 02/25/2022   Multiple sclerosis (HCC) 02/25/2022   Right foot drop 02/25/2022   High risk medication use 02/25/2022   Allodynia 02/25/2022    PCP: Nash Bade, MD  REFERRING PROVIDER: Adah Acron, MD   REFERRING DIAG: [phone] (ICD-10-CM) - Other tear of lateral meniscus of right knee, unspecified whether old or current tear, sequela   THERAPY DIAG:  Acute pain of right knee  Muscle weakness (generalized)  Other abnormalities of gait and mobility  Unsteadiness on feet  Rationale for Evaluation and Treatment: Rehabilitation  ONSET DATE: 09/13/23  SUBJECTIVE:   SUBJECTIVE STATEMENT: Patient reports she feels  its her confidence in her balance that is must challenging with stairs.   EVAL: Patient underwent knee surgery in November and has been doing exercises at home, but needs more help in how to progress her ROM and strength. The pain is better since the surgery. Still has pain in the knee if she twists and when she goes down the stairs. Patient was diagnosed with MS in 2013 and feels her balance, coordination, and strength is effected on the Rt side. She does have spasticity in the calf. She also has neuropathy in both feet.   PERTINENT HISTORY: MS PAIN:  Are you having pain? Yes: NPRS scale: none currently; at worst 1/10 Pain location: Rt anterior knee Pain description: tender Aggravating factors: transitioning to standing after prolonged sitting   Relieving factors: straightening the knee   PRECAUTIONS: Fall  WEIGHT BEARING RESTRICTIONS: No  FALLS:  Has patient fallen in last 6 months? Yes. Number of falls 2; tripped over grate and fell, LOB and fell  LIVING ENVIRONMENT: Lives with: lives with their daughter Lives in: House/apartment Stairs: Yes: Internal: 2 x 7 steps; on left going up and External: 7 steps; can reach both Has following equipment at home: Single point cane, Quad cane small base, and Walker - 2 wheeled  OCCUPATION: work from home   PATIENT GOALS: "get the full ROM, guidance in exercise."   OBJECTIVE:  Note: Objective measures were completed at Evaluation unless otherwise noted.  DIAGNOSTIC FINDINGS:  Rt knee MRI: IMPRESSION: 1. Small radial tear of the lateral meniscus midbody. 2. 1.7 cm unstable osteochondral lesion of the peripheral lateral femoral condyle. 3. 4.1 cm synovial mass in the lateral joint space adjacent to the patella, possibly focal nodular synovitis. 4. 2.3 cm multiloculated cyst along the medial joint line could represent a parameniscal cyst from occult meniscal tear or ganglion cyst. 5. Mild tricompartmental  osteoarthritis.  COGNITION: Overall cognitive status: Within functional limits for tasks assessed     SENSATION: Not tested; patient endorses bilateral neuropathy   EDEMA:  Unable to inspect knee due to clothing  POSTURE: weight shifts Lt, maintains RLE abducted with foot ER.   PALPATION: TTP lateral aspect of Rt knee   LOWER EXTREMITY ROM: Active ROM Right eval Left eval Right 11/12/23 11/23/23 Right  01/04/24 Right  01/27/24 Right  02/08/24 Right   Hip flexion         Hip extension         Hip abduction         Hip adduction         Hip internal rotation         Hip external rotation         Knee flexion 85 in sitting  92 in sitting 96 in sitting  100 in sitting   105 sitting   Knee extension Lacking 4    Lacking 2 0   Ankle dorsiflexion         Ankle plantarflexion         Ankle inversion         Ankle eversion          (Blank rows = not tested)  LOWER EXTREMITY MMT: MMT Right eval Left eval 01/04/24 Right   Hip flexion 3- 5 3+  Hip extension     Hip abduction     Hip adduction     Hip internal rotation     Hip external rotation     Knee flexion 4- 5 4+  Knee extension 4- 5 4+  Ankle dorsiflexion 3- 5 3-  Ankle plantarflexion 3+ in sitting 5 3+ in sitting   Ankle inversion 3- 5 3+  Ankle eversion 3- 5 3-   (Blank rows = not tested)  FUNCTIONAL TESTS:  Sit to stand: maintains Rt knee in extension; significant use of BUE  Stair negotiation: reciprocal stair negotiation with ascent; step to descent   GAIT: Distance walked: 20 ft  Assistive device utilized: None Level of assistance: Complete Independence Comments: circumduction RLE, decreased knee flexion RLE, Rt foot ER, lateral trunk lean  [facility] Adult PT Treatment:                                                DATE: 02/10/2024 Therapeutic Exercise: Recumbent bike L2 x 5 min Bridges + hip abd iso GTB 2x10 Hooklying resisted marching + blue TB 2x10  Neuromuscular re-ed: Modified single leg bridge + L  foot on block 2x10 Sidelying bent knee hip abd + RTB 2x10 Tandem stance balance Modified tandem balance + slow head turns/nods Therapeutic Activity: 6" step ups + 1 HHA  x10; x10 no HHA Stair navigation ascend/descend stairs + 1 HHA   [facility] Adult PT Treatment:  DATE: 02/08/24 Therapeutic Exercise: Hip bridge 2 x 10  Hooklying resisted hip flexion blue band 2 x 10  Sidelying hip abduction 2 x 10  Therapeutic Activity: Step ups 6 inch single UE support x 10; x 10 no UE support  Step ups 8 inch single UE support 2 x 5  Step taps 8 inch no UE support x 10  Step ups 8 inch no UE support 2 x 5 CGA to SBA    [facility] Adult PT Treatment:                                                DATE: 02/02/2024 Therapeutic Exercise: NuStep L6 x 5 min Prone quad stretches with strap 3x30" 1/2 long sitting HS/DF stretch 2x30" --> HS stretch + small ankle pumps with black TB Neuromuscular re-ed: Prone: Knee flexion + 1#AW --> SBA for leg tracking x10 Knee bent hip extension --> SBA for leg tracking x10 Therapeutic Activity: STS from hi/low table Squats with seat tap on elevated table  PATIENT EDUCATION:  Education details: HEP update Person educated: Patient Education method: Explanation, demo, cues, handout Education comprehension: verbalized understanding, returned demo, cues   HOME EXERCISE PROGRAM: Access Code: [email or web address] URL: [email or web address] Date: 02/08/2024 Prepared by: Forrestine Ike  Exercises - Gastroc Stretch on Wall  - 1 x daily - 7 x weekly - 2 sets - 30 sec hold - Seated Knee Flexion AAROM  - 1 x daily - 7 x weekly - 1 sets - 10 reps - 5 sec  hold - Standing Hip Abduction with Counter Support  - 1 x daily - 7 x weekly - 3 sets - 10 reps - Long Sitting Isometric Hip Abduction with Ball at [employer]  - 1 x daily - 7 x weekly - 3 sets - 10 reps - Seated Long Arc Quad  - 1 x daily - 7 x weekly - 2 sets - 10 reps - Seated Toe  Raise  - 1 x daily - 7 x weekly - 2 sets - 10 reps - Forward Step Down  - 1 x daily - 7 x weekly - 2 sets - 10 reps - Mini Squat with Counter Support  - 1 x daily - 7 x weekly - 2 sets - 10 reps - Tandem Stance  - 1 x daily - 7 x weekly - 3 sets - max  hold - Single Leg Stance with Support  - 1 x daily - 7 x weekly - 3 sets - max hold - Supine Bridge  - 1 x daily - 7 x weekly - 2 sets - 10 reps - Supine March with Resistance Band  - 1 x daily - 7 x weekly - 2 sets - 10 reps  ASSESSMENT:  CLINICAL IMPRESSION: Closed-chain LE strengthening progressed with emphasis on loading R LE for stability. Patient able to ascend/descend stair with reciprocal stepping pattern and 1 UE support on handrail. Patient has met all goals within POC nad is agreeable to discharge at this time.   From eval: Patient is a 48 y.o. female who was seen today for physical therapy evaluation and treatment for s/p Rt knee arthroscopy, debridement of osteochondral lesion and partial lateral meniscectomy on 09/13/23. She also has a history of MS reporting balance, strength and coordination deficits on the RLE. Upon assessment she is noted to have limited Rt  knee ROM, RLE weakness, difficulty with transfers and stair negotiation, and gait abnormalities. She will benefit from skilled PT to address the above stated deficits in order to optimize her function and assist with pain reduction.   OBJECTIVE IMPAIRMENTS: Abnormal gait, decreased activity tolerance, decreased balance, decreased coordination, difficulty walking, decreased ROM, decreased strength, hypomobility, impaired sensation, improper body mechanics, postural dysfunction, and pain.   GOALS: Goals reviewed with patient? Yes  SHORT TERM GOALS: Target date: 12/01/2023  Patient will be independent and compliant with initial HEP.  Baseline: issued at eval  Goal status: MET  2.  Patient will perform sit to stand without UE support to improve ease of transfers.  Baseline:  see above  11/23/23: requires use of UE support due to pain/weakness  01/04/24: no use of UE support  Goal status: MET   3.  Patient will demonstrate at least 100 degrees of Rt knee flexion AROM to improve gait mechanics.  Baseline: see above  Goal status: MET    LONG TERM GOALS: Target date: 02/19/24  Patient will be able to descend stairs with reciprocal pattern with handrail support.  Baseline: step to  01/04/24: step to descent  02/10/24: ascend/descend with reciprocal & 1 handrail Goal status: MET  2.  Patient will demonstrate full Rt knee extension to improve gait mechanics.  Baseline: see above  Goal status: MET   3.  Patient will return to dance activity without limitation as it relates to her knee.  Baseline: currently using brace and avoiding twisting motions.  01/04/24: attempted previous dance activity, but this aggravated the knee  Goal status: MET  4.  Patient will be independent with advanced home program to progress/maintain current level of function.  Baseline: initial HEP issued  Goal status: MET  PLAN:  PT FREQUENCY: 2x/week  PT DURATION: 6 weeks  PLANNED INTERVENTIONS: 97164- PT Re-evaluation, 97110-Therapeutic exercises, 97530- Therapeutic activity, [id number]- Neuromuscular re-education, 97535- Self Care, 21308- Manual therapy, [id number]- Gait training, 97014- Electrical stimulation (unattended), [id number]- Electrical stimulation (manual), 97016- Vasopneumatic device, Dry Needling, Joint mobilization, and Cryotherapy  PLAN FOR NEXT SESSION: Discharge  Sims Duck, PTA 02/10/24 12:41 PM  Forrestine Ike, PT, DPT, ATC 02/10/24 12:58 PM

## 2024-02-15 ENCOUNTER — Ambulatory Visit

## 2024-02-17 ENCOUNTER — Encounter

## 2024-02-22 ENCOUNTER — Encounter

## 2024-02-24 ENCOUNTER — Encounter

## 2024-03-16 ENCOUNTER — Other Ambulatory Visit: Payer: Self-pay | Admitting: Neurology

## 2024-03-16 NOTE — Telephone Encounter (Signed)
 Last seen on 09/28/23 Follow up scheduled on 06/29/24  Was patient to continue Rx?  Rx pending

## 2024-06-01 ENCOUNTER — Other Ambulatory Visit: Payer: Self-pay | Admitting: Neurology

## 2024-06-28 ENCOUNTER — Telehealth: Payer: Self-pay | Admitting: Neurology

## 2024-06-28 NOTE — Telephone Encounter (Signed)
 [facility] conf

## 2024-06-29 ENCOUNTER — Ambulatory Visit: Payer: Federal, State, Local not specified - PPO | Admitting: Neurology

## 2024-06-29 ENCOUNTER — Encounter: Payer: Self-pay | Admitting: Neurology

## 2024-06-29 VITALS — BP 115/82 | HR 98 | Ht 66.0 in | Wt 188.5 lb

## 2024-06-29 DIAGNOSIS — R208 Other disturbances of skin sensation: Secondary | ICD-10-CM | POA: Diagnosis not present

## 2024-06-29 DIAGNOSIS — M21371 Foot drop, right foot: Secondary | ICD-10-CM | POA: Diagnosis not present

## 2024-06-29 DIAGNOSIS — Z7985 Long-term (current) use of injectable non-insulin antidiabetic drugs: Secondary | ICD-10-CM

## 2024-06-29 DIAGNOSIS — G35 Multiple sclerosis: Secondary | ICD-10-CM | POA: Diagnosis not present

## 2024-06-29 DIAGNOSIS — Z79899 Other long term (current) drug therapy: Secondary | ICD-10-CM

## 2024-06-29 DIAGNOSIS — S83281S Other tear of lateral meniscus, current injury, right knee, sequela: Secondary | ICD-10-CM

## 2024-06-29 DIAGNOSIS — G373 Acute transverse myelitis in demyelinating disease of central nervous system: Secondary | ICD-10-CM

## 2024-06-29 DIAGNOSIS — G35D Multiple sclerosis, unspecified: Secondary | ICD-10-CM

## 2024-06-29 DIAGNOSIS — G35A Relapsing-remitting multiple sclerosis: Secondary | ICD-10-CM

## 2024-06-29 DIAGNOSIS — E1142 Type 2 diabetes mellitus with diabetic polyneuropathy: Secondary | ICD-10-CM

## 2024-06-29 MED ORDER — DALFAMPRIDINE ER 10 MG PO TB12: ORAL_TABLET | ORAL | 3 refills | Status: DC

## 2024-06-29 MED ORDER — GABAPENTIN 800 MG PO TABS
800.0000 mg | ORAL_TABLET | Freq: Three times a day (TID) | ORAL | 3 refills | Status: AC
Start: 2024-06-29 — End: ?

## 2024-06-29 NOTE — Progress Notes (Signed)
 [facility]  PATIENT: Karen Oconnor DOB: Apr 02, 1976  REFERRING DOCTOR OR PCP: Shera Mace, MD; Saralyn Jasper, NP SOURCE: Patient, notes from [facility] between 2013 and present, imaging and lab reports, MRI images will be personally reviewed when available an addendum will be added  _________________________________   HISTORICAL  CHIEF COMPLAINT:  Chief Complaint  Patient presents with   Follow-up    Pt in room 10.alone. Here for MS follow up. DMT: Ocrevus Last infusion date: 02/23/2024 Next infusion date: 08/23/2024  Pt reports doing well, no falls, last eye exam was last year.    HISTORY OF PRESENT ILLNESS:  Karen Oconnor is a 48 y.o. woman with multiple sclerosis.  UPDATE  06/29/2024 She is currently on Ocrevus and tolerates it well.    Next infusion in April, 2025 and due in October 2025.SABRA   She has no infusion reactions. She has had no exacerbations since starting in 2021.   IgG and IgM are in the normal range on 2024 testing.     Gait is mildly off balanced and right spastic with a right foot drop.  She denies any recent falls but stumbled some.   She uses the banister on stairs    She has right leg weakness and spasticity.   Ampyra  had helped but she had strange trouble getting it recently   Baclofen  helps.  She has altered sensation sensation in her right leg and a different altered sensation right flank with numbness.     There is superimposed burning tingling in her feet, worse at night (Diabetic?)  She reports that her bladder function is doing ok with some urgency.  She denies recent incontinence.  Vision was poor as a child and she feels it has been stable and she is much better with contacts.     Color vision is fine.   She never had diplopia.     She has fatigue but accomplishes daily goals.   She has some insomnia, sleep onset worse than maintenance.About 3 nights a month.  Mood and cognition are doing well.    She also has type 2 NIDDM.  She  is on Mounjaro and DM is doing well and she lost 20 pounds .   HgbA1c was 8.6 but much better than last year at 12.      She had EBV and Varicella as a child.       MS HISTORY In July 2013, she presented with electrical sock sensations in her back and legs followed by numbness in the right flank/abdomen and allodynia in her right leg.   She also had a right foot drop and spasticity.   She also has numbness in her left foot.   She was concerned about stroke and saw Dr. Marjorie who did an MRI of the cervical/thoracic spine.   She had a large non-enhancing lesion (was 3 months after 1st symptom) at T7-T11.   MRI of the brain also showed lesions and MS was diagnosed.   She was initially placed on Tecfidera and stayed on that medication until March 2021 after an MRI February 2021 showed additional multiple sclerosis foci.  She was placed on Ocrevus March 2021 and continues on that medication.  She has been seeing Dr. Marty Dooms at [facility].   Imaging (report): MRI of the brain 12/09/2019 showed multiple T2/FLAIR hyperintense foci in the periventricular, juxtacortical and deep white matter.  2 of the foci, 1 high in the right frontal lobe and and other in the  periventricular white matter adjacent to the left ventricular atrium.  MRI of the cervical and thoracic spine 12/09/2019 showed a focus at T7-T8 centrally towards the right and possible focus at C3-C4.  MRI report from 08/24/2012 showed multiple T2/FLAIR hyperintense foci in the cerebral hemispheres consistent with MS with enhancement of a focus in the left parietal lobe.  MRI of the cervical and thoracic spine 08/11/2012 shows a right hemicord focus from T7-T11 that did not enhance.  The cervical spinal cord was normal.  Laboratory tests: 11/28/2019 Quantiferon TB and hepatitis B and C tests were negative.  IgG and IgM and IgA were normal.  08/30/2012: Anti-NMO IgG was negative   In 2023,  anti-NMO and anti-MOG and both are negative.     REVIEW OF SYSTEMS: Constitutional: No fevers, chills, sweats, or change in appetite Eyes: No visual changes, double vision, eye pain Ear, nose and throat: No hearing loss, ear pain, nasal congestion, sore throat Cardiovascular: No chest pain, palpitations Respiratory:  No shortness of breath at rest or with exertion.   No wheezes GastrointestinaI: No nausea, vomiting, diarrhea, abdominal pain, fecal incontinence Genitourinary:  No dysuria, urinary retention or frequency.  No nocturia. Musculoskeletal:  No neck pain, back pain Integumentary: No rash, pruritus, skin lesions Neurological: as above Psychiatric: No depression at this time.  No anxiety Endocrine: No palpitations, diaphoresis, change in appetite, change in weigh or increased thirst Hematologic/Lymphatic:  No anemia, purpura, petechiae. Allergic/Immunologic: No itchy/runny eyes, nasal congestion, recent allergic reactions, rashes  ALLERGIES: No Known Allergies  HOME MEDICATIONS:  Current Outpatient Medications:    Alogliptin Benzoate 25 MG TABS, Take 1 tablet by mouth daily., Disp: , Rfl:    baclofen  (LIORESAL ) 10 MG tablet, TAKE 1 TABLET 3 TIMES DAILYAS NEEDED, Disp: 270 tablet, Rfl: 1   Biotin 10 MG CAPS, Take 10,000 mcg by mouth daily., Disp: , Rfl:    Cholecalciferol 25 MCG (1000 UT) capsule, Take 5,000 Units by mouth daily., Disp: , Rfl:    gabapentin  (NEURONTIN ) 300 MG capsule, TAKE 3 CAPSULES TWICE DAILY, Disp: 540 capsule, Rfl: 1   MOUNJARO 12.5 MG/0.5ML Pen, Inject 12.5 mg into the skin once a week., Disp: , Rfl:    naproxen  (NAPROSYN ) 250 MG tablet, Take 1 tablet (250 mg total) by mouth 2 (two) times daily as needed., Disp: 180 tablet, Rfl: 1   ocrelizumab (OCREVUS) 300 MG/10ML injection, Inject 600 mg into the vein every 6 (six) months., Disp: , Rfl:    dalfampridine  10 MG TB12, One po q12 hours, Disp: 180 tablet, Rfl: 3   FARXIGA 10 MG TABS tablet, Take 10 mg by mouth daily. (Patient not taking: Reported on  06/29/2024), Disp: , Rfl:    oxyCODONE -acetaminophen  (PERCOCET/ROXICET) 5-325 MG tablet, Take 1-2 tablets by mouth every 6 (six) hours as needed for severe pain (pain score 7-10). (Patient not taking: Reported on 06/29/2024), Disp: 40 tablet, Rfl: 0  PAST MEDICAL HISTORY: Past Medical History:  Diagnosis Date   Diabetes (HCC)    MS (multiple sclerosis) (HCC)     PAST SURGICAL HISTORY:   FAMILY HISTORY: Family History  Problem Relation Age of Onset   Gout Mother    Diabetes Father     SOCIAL HISTORY:  Social History   Socioeconomic History   Marital status: Single    Spouse name: Not on file   Number of children: Not on file   Years of education: Not on file   Highest education level: Not on file  Occupational History  Not on file  Tobacco Use   Smoking status: Never   Smokeless tobacco: Never  Vaping Use   Vaping status: Never Used  Substance and Sexual Activity   Alcohol use: Yes    Comment: rare   Drug use: Never   Sexual activity: Not on file  Other Topics Concern   Not on file  Social History Narrative   Right handed   Caffeine use: coffee/tea daily   Social Drivers of Health   Financial Resource Strain: Low Risk  (01/16/2024)   Received from [facility]   Overall Financial Resource Strain (CARDIA)    Difficulty of Paying Living Expenses: Not hard at all  Food Insecurity: No Food Insecurity (01/16/2024)   Received from [facility]   Hunger Vital Sign    Within the past 12 months, you worried that your food would run out before you got the money to buy more.: Never true    Within the past 12 months, the food you bought just didn't last and you didn't have money to get more.: Never true  Transportation Needs: No Transportation Needs (01/16/2024)   Received from [facility] - Transportation    Lack of Transportation (Medical): No    Lack of Transportation (Non-Medical): No  Physical Activity: Sufficiently Active (01/16/2024)   Received from  [facility]   Exercise Vital Sign    On average, how many days per week do you engage in moderate to strenuous exercise (like a brisk walk)?: 3 days    On average, how many minutes do you engage in exercise at this level?: 150+ min  Stress: Stress Concern Present (01/16/2024)   Received from [facility] - Occupational Stress Questionnaire    Feeling of Stress : Very much  Social Connections: Socially Integrated (01/16/2024)   Received from [facility]   Social Network    How would you rate your social network (family, work, friends)?: Good participation with social networks  Intimate Partner Violence: Not At Risk (01/16/2024)   Received from [facility]   HITS    Over the last 12 months how often did your partner physically hurt you?: Never    Over the last 12 months how often did your partner insult you or talk down to you?: Never    Over the last 12 months how often did your partner threaten you with physical harm?: Never    Over the last 12 months how often did your partner scream or curse at you?: Never     PHYSICAL EXAM  Vitals:   06/29/24 1136  BP: 115/82  Pulse: 98  SpO2: 99%  Weight: 188 lb 8 oz (85.5 kg)  Height: 5' 6 (1.676 m)     Body mass index is 30.42 kg/m.     General: The patient is well-developed and well-nourished and in no acute distress  HEENT:  Head is Toco/AT.  Sclera are anicteric.     Skin: Extremities are without rash or  edema.  Musculoskeletal:  Back is nontender.  The knee is slightly painful on the left to anterior drawer maneuver.  Valgus and varus and posterior drawer were fine   Neurologic Exam  Mental status: The patient is alert and oriented x 3 at the time of the examination. The patient has apparent normal recent and remote memory, with an apparently normal attention span and concentration ability.   Speech is normal.  Cranial nerves: Extraocular movements are full.  Visual acuity  was reduced OD versus OS.  Color vision was symmetric.  There is good facial sensation to soft touch bilaterally.Facial strength is normal.  Trapezius and sternocleidomastoid strength is normal. No dysarthria is noted.    No obvious hearing deficits are noted.  Motor:  Muscle bulk is normal.   Tone is increased in the right leg. . Strength is  5 / 5 in arms and left leg but 4-/5 iliopsoas and 4+/5 quad 4/5 EHL and ankle extension,   Sensory: She reported mild altered sensation in the right flank as well as altered sensation in the right leg and both feet.  Vibration sensation was 100% at the knees (relative to the hands), 80% at the ankles and 20% at the toes.   Coordination: Finger-nose-finger was performed well.  Heel-to-shin was reduced bilaterally  Gait and station: Station is normal.  She has a right foot drop and spastic right gait.  She is not able to do tandem walk.  Romberg was negative. . Reflexes: Deep tendon reflexes are symmetric and normal n arms but increased 3+ right and 3 left knee and anle.       ASSESSMENT AND PLAN  Multiple sclerosis (HCC) - Plan: CBC with Differential/Platelet, IgG, IgA, IgM, CD20 B Cells  Transverse myelitis (HCC)  High risk medication use - Plan: CBC with Differential/Platelet, IgG, IgA, IgM, CD20 B Cells  Right foot drop  Other tear of lateral meniscus of right knee, unspecified whether old or current tear, sequela  Allodynia  Diabetic polyneuropathy associated with type 2 diabetes mellitus (HCC)  Continue Ocrevus.  We will check an MRI of the brain to determine if there is any subclinical progression.  Check IgG/IgM and CBC with differential and CD20/CD19 at next visit.Stay active and exercise as tolerated. I will get her back on Ampyra  Increase the gabapentin  dose to 800 mg p.o. 3 times daily for her dysesthesias.   Return in 6 months or sooner for new or worsening neurologic symptoms.  Aleksandr Pellow A. Vear, MD, [facility] [place] 06/29/2024, 11:54  AM Certified in Neurology, Clinical Neurophysiology, Sleep Medicine and Neuroimaging  [facility] [address] [place], [place] [address] [phone]

## 2024-06-30 ENCOUNTER — Ambulatory Visit: Payer: Self-pay | Admitting: Neurology

## 2024-06-30 LAB — CBC WITH DIFFERENTIAL/PLATELET
Basophils Absolute: 0 x10E3/uL (ref 0.0–0.2)
Basos: 1 %
EOS (ABSOLUTE): 0.1 x10E3/uL (ref 0.0–0.4)
Eos: 4 %
Hematocrit: 40.5 % (ref 34.0–46.6)
Hemoglobin: 13.3 g/dL (ref 11.1–15.9)
Immature Grans (Abs): 0 x10E3/uL (ref 0.0–0.1)
Immature Granulocytes: 0 %
Lymphocytes Absolute: 1.3 x10E3/uL (ref 0.7–3.1)
Lymphs: 44 %
MCH: 27.9 pg (ref 26.6–33.0)
MCHC: 32.8 g/dL (ref 31.5–35.7)
MCV: 85 fL (ref 79–97)
Monocytes Absolute: 0.3 x10E3/uL (ref 0.1–0.9)
Monocytes: 8 %
Neutrophils Absolute: 1.3 x10E3/uL — ABNORMAL LOW (ref 1.4–7.0)
Neutrophils: 43 %
Platelets: 310 x10E3/uL (ref 150–450)
RBC: 4.76 x10E6/uL (ref 3.77–5.28)
RDW: 13.6 % (ref 11.7–15.4)
WBC: 3 x10E3/uL — ABNORMAL LOW (ref 3.4–10.8)

## 2024-06-30 LAB — IGG, IGA, IGM
IgA/Immunoglobulin A, Serum: 195 mg/dL (ref 87–352)
IgG (Immunoglobin G), Serum: 1415 mg/dL (ref 586–1602)
IgM (Immunoglobulin M), Srm: 30 mg/dL (ref 26–217)

## 2024-07-03 ENCOUNTER — Telehealth: Payer: Self-pay | Admitting: Neurology

## 2024-07-03 NOTE — Telephone Encounter (Signed)
 no auth required sent to [employer] [phone]

## 2024-07-27 ENCOUNTER — Telehealth: Payer: Self-pay | Admitting: *Deleted

## 2024-07-27 NOTE — Telephone Encounter (Signed)
 Per Holly/[facility]: Marlou Hans 1977/03/19VN and Order needs the new updated DX code added    [facility] will bring updated order to POD for MD to sign

## 2024-07-31 NOTE — Telephone Encounter (Signed)
 Gave updated order to [facility]

## 2024-08-01 ENCOUNTER — Encounter: Payer: Self-pay | Admitting: Neurology

## 2024-08-07 ENCOUNTER — Other Ambulatory Visit: Payer: Self-pay

## 2024-08-07 MED ORDER — DALFAMPRIDINE ER 10 MG PO TB12: ORAL_TABLET | ORAL | 3 refills | Status: DC

## 2024-08-08 ENCOUNTER — Other Ambulatory Visit: Payer: Self-pay | Admitting: *Deleted

## 2024-08-08 DIAGNOSIS — G35A Relapsing-remitting multiple sclerosis: Secondary | ICD-10-CM

## 2024-08-08 DIAGNOSIS — R269 Unspecified abnormalities of gait and mobility: Secondary | ICD-10-CM

## 2024-08-08 MED ORDER — DALFAMPRIDINE ER 10 MG PO TB12
ORAL_TABLET | ORAL | 3 refills | Status: AC
Start: 2024-08-08 — End: ?

## 2024-08-15 ENCOUNTER — Other Ambulatory Visit (HOSPITAL_COMMUNITY): Payer: Self-pay

## 2024-08-15 ENCOUNTER — Telehealth: Payer: Self-pay

## 2024-08-15 NOTE — Telephone Encounter (Signed)
   Received CMM request for PA however active PA on file good thru 10/21/2024

## 2024-08-21 ENCOUNTER — Other Ambulatory Visit: Payer: Self-pay | Admitting: Family Medicine

## 2024-08-21 DIAGNOSIS — Z1231 Encounter for screening mammogram for malignant neoplasm of breast: Secondary | ICD-10-CM

## 2024-08-23 ENCOUNTER — Encounter: Payer: Self-pay | Admitting: Neurology

## 2024-08-24 ENCOUNTER — Inpatient Hospital Stay
Admission: RE | Admit: 2024-08-24 | Discharge: 2024-08-24 | Disposition: A | Discharge status: Home / Self Care | Payer: Self-pay | Source: Ambulatory Visit | Attending: Family Medicine | Admitting: Family Medicine

## 2024-08-24 ENCOUNTER — Inpatient Hospital Stay
Admission: RE | Admit: 2024-08-24 | Discharge: 2024-08-24 | Disposition: A | Payer: Self-pay | Source: Ambulatory Visit | Attending: Family Medicine | Admitting: Family Medicine

## 2024-08-24 ENCOUNTER — Other Ambulatory Visit: Payer: Self-pay | Admitting: *Deleted

## 2024-08-24 DIAGNOSIS — Z1231 Encounter for screening mammogram for malignant neoplasm of breast: Secondary | ICD-10-CM

## 2024-08-28 ENCOUNTER — Encounter: Payer: Self-pay | Admitting: Radiology

## 2024-08-28 ENCOUNTER — Ambulatory Visit
Admission: RE | Admit: 2024-08-28 | Discharge: 2024-08-28 | Disposition: A | Discharge status: Home / Self Care | Source: Ambulatory Visit | Attending: Family Medicine | Admitting: Family Medicine

## 2024-08-28 DIAGNOSIS — Z1231 Encounter for screening mammogram for malignant neoplasm of breast: Secondary | ICD-10-CM | POA: Insufficient documentation

## 2024-08-31 ENCOUNTER — Encounter: Payer: Self-pay | Admitting: Neurology

## 2024-09-01 ENCOUNTER — Other Ambulatory Visit: Payer: Self-pay | Admitting: Neurology

## 2024-09-01 DIAGNOSIS — G35A Relapsing-remitting multiple sclerosis: Secondary | ICD-10-CM

## 2024-09-01 DIAGNOSIS — M21371 Foot drop, right foot: Secondary | ICD-10-CM

## 2024-09-23 ENCOUNTER — Encounter: Payer: Self-pay | Admitting: Neurology

## 2024-09-27 ENCOUNTER — Other Ambulatory Visit: Payer: Self-pay | Admitting: *Deleted

## 2024-09-27 DIAGNOSIS — M21371 Foot drop, right foot: Secondary | ICD-10-CM

## 2024-09-27 DIAGNOSIS — M25569 Pain in unspecified knee: Secondary | ICD-10-CM

## 2024-09-27 DIAGNOSIS — G35A Relapsing-remitting multiple sclerosis: Secondary | ICD-10-CM

## 2024-09-27 DIAGNOSIS — G373 Acute transverse myelitis in demyelinating disease of central nervous system: Secondary | ICD-10-CM

## 2024-09-27 DIAGNOSIS — R531 Weakness: Secondary | ICD-10-CM

## 2024-09-27 DIAGNOSIS — M25562 Pain in left knee: Secondary | ICD-10-CM

## 2024-09-27 DIAGNOSIS — R269 Unspecified abnormalities of gait and mobility: Secondary | ICD-10-CM

## 2024-09-28 ENCOUNTER — Telehealth: Payer: Self-pay | Admitting: Neurology

## 2024-09-28 NOTE — Telephone Encounter (Signed)
 Referral to Physical Therapy faxed to  [facility] Phone: [phone] Fax: [phone]

## 2024-10-11 ENCOUNTER — Ambulatory Visit: Admission: RE | Admit: 2024-10-11 | Discharge: 2024-10-11 | Attending: Neurology | Admitting: Neurology

## 2024-10-11 ENCOUNTER — Ambulatory Visit
Admission: RE | Admit: 2024-10-11 | Discharge: 2024-10-11 | Disposition: A | Source: Ambulatory Visit | Attending: Neurology | Admitting: Neurology

## 2024-10-11 DIAGNOSIS — M21371 Foot drop, right foot: Secondary | ICD-10-CM | POA: Diagnosis present

## 2024-10-11 DIAGNOSIS — G35A Relapsing-remitting multiple sclerosis: Secondary | ICD-10-CM | POA: Diagnosis present

## 2024-10-11 DIAGNOSIS — G35D Multiple sclerosis, unspecified: Secondary | ICD-10-CM

## 2024-10-11 MED ORDER — GADOBUTROL 1 MMOL/ML IV SOLN
9.0000 mL | Freq: Once | INTRAVENOUS | Status: AC | PRN
  Administered 2024-10-11: 11:00:00 9 mL via INTRAVENOUS

## 2024-10-13 ENCOUNTER — Ambulatory Visit: Payer: Self-pay | Admitting: Neurology

## 2024-10-17 NOTE — Progress Notes (Signed)
 Please inform patient that her Brain MRI is stable, it shows the old lesions compatible with he diagnosis of MS but no new lesions.

## 2024-10-24 ENCOUNTER — Other Ambulatory Visit: Payer: Self-pay | Admitting: Neurology

## 2024-10-24 DIAGNOSIS — G35A Relapsing-remitting multiple sclerosis: Secondary | ICD-10-CM

## 2024-10-24 DIAGNOSIS — M542 Cervicalgia: Secondary | ICD-10-CM

## 2025-01-01 ENCOUNTER — Ambulatory Visit: Admitting: Neurology

## 2025-01-25 ENCOUNTER — Ambulatory Visit: Admitting: Family Medicine

## 2025-08-13 ENCOUNTER — Ambulatory Visit: Admitting: Family Medicine
